# Patient Record
Sex: Female | Born: 1935 | Race: White | Hispanic: No | Smoking: Never smoker
Health system: Southern US, Community
[De-identification: ages and names within clinical notes are randomized; demographics above are authoritative.]

## PROBLEM LIST (undated history)

## (undated) DIAGNOSIS — M1990 Unspecified osteoarthritis, unspecified site: Secondary | ICD-10-CM

## (undated) DIAGNOSIS — R05 Cough: Secondary | ICD-10-CM

## (undated) DIAGNOSIS — I5020 Unspecified systolic (congestive) heart failure: Secondary | ICD-10-CM

## (undated) DIAGNOSIS — R058 Other specified cough: Secondary | ICD-10-CM

## (undated) DIAGNOSIS — I255 Ischemic cardiomyopathy: Secondary | ICD-10-CM

## (undated) DIAGNOSIS — R112 Nausea with vomiting, unspecified: Secondary | ICD-10-CM

## (undated) DIAGNOSIS — Z9889 Other specified postprocedural states: Secondary | ICD-10-CM

## (undated) DIAGNOSIS — J45909 Unspecified asthma, uncomplicated: Secondary | ICD-10-CM

## (undated) HISTORY — DX: Unspecified systolic (congestive) heart failure: I50.20

## (undated) HISTORY — DX: Other specified cough: R05.8

## (undated) HISTORY — DX: Cough: R05

## (undated) HISTORY — PX: JOINT REPLACEMENT: SHX530

## (undated) HISTORY — DX: Unspecified asthma, uncomplicated: J45.909

---

## 1958-09-30 HISTORY — PX: APPENDECTOMY: SHX54

## 1978-09-30 HISTORY — PX: JOINT REPLACEMENT: SHX530

## 2000-02-10 ENCOUNTER — Encounter: Payer: Self-pay | Admitting: Orthopaedic Surgery

## 2000-02-10 ENCOUNTER — Ambulatory Visit (HOSPITAL_COMMUNITY): Admission: RE | Admit: 2000-02-10 | Discharge: 2000-02-10 | Payer: Self-pay | Admitting: Orthopaedic Surgery

## 2000-02-22 ENCOUNTER — Encounter: Payer: Self-pay | Admitting: Orthopaedic Surgery

## 2000-02-22 ENCOUNTER — Ambulatory Visit (HOSPITAL_COMMUNITY): Admission: RE | Admit: 2000-02-22 | Discharge: 2000-02-22 | Payer: Self-pay | Admitting: Orthopaedic Surgery

## 2000-03-08 ENCOUNTER — Ambulatory Visit (HOSPITAL_COMMUNITY): Admission: RE | Admit: 2000-03-08 | Discharge: 2000-03-08 | Payer: Self-pay | Admitting: Orthopaedic Surgery

## 2000-03-08 ENCOUNTER — Encounter: Payer: Self-pay | Admitting: Orthopaedic Surgery

## 2000-03-22 ENCOUNTER — Ambulatory Visit (HOSPITAL_COMMUNITY): Admission: RE | Admit: 2000-03-22 | Discharge: 2000-03-22 | Payer: Self-pay | Admitting: Orthopaedic Surgery

## 2000-03-22 ENCOUNTER — Encounter: Payer: Self-pay | Admitting: Orthopaedic Surgery

## 2002-03-03 ENCOUNTER — Ambulatory Visit (HOSPITAL_COMMUNITY): Admission: RE | Admit: 2002-03-03 | Discharge: 2002-03-03 | Payer: Self-pay | Admitting: Orthopaedic Surgery

## 2002-03-03 ENCOUNTER — Encounter: Payer: Self-pay | Admitting: Orthopaedic Surgery

## 2002-03-13 ENCOUNTER — Encounter: Admission: RE | Admit: 2002-03-13 | Discharge: 2002-03-13 | Payer: Self-pay | Admitting: Orthopaedic Surgery

## 2002-03-13 ENCOUNTER — Encounter: Payer: Self-pay | Admitting: Orthopaedic Surgery

## 2002-03-26 ENCOUNTER — Encounter: Admission: RE | Admit: 2002-03-26 | Discharge: 2002-03-26 | Payer: Self-pay | Admitting: Orthopaedic Surgery

## 2002-03-26 ENCOUNTER — Encounter: Payer: Self-pay | Admitting: Orthopaedic Surgery

## 2002-04-10 ENCOUNTER — Encounter: Admission: RE | Admit: 2002-04-10 | Discharge: 2002-04-10 | Payer: Self-pay | Admitting: Orthopaedic Surgery

## 2002-04-10 ENCOUNTER — Encounter: Payer: Self-pay | Admitting: Orthopaedic Surgery

## 2003-10-25 ENCOUNTER — Encounter: Admission: RE | Admit: 2003-10-25 | Discharge: 2003-10-25 | Payer: Self-pay | Admitting: Orthopaedic Surgery

## 2003-11-08 ENCOUNTER — Encounter: Admission: RE | Admit: 2003-11-08 | Discharge: 2003-11-08 | Payer: Self-pay | Admitting: Orthopaedic Surgery

## 2003-12-10 ENCOUNTER — Encounter: Admission: RE | Admit: 2003-12-10 | Discharge: 2003-12-10 | Payer: Self-pay | Admitting: Orthopaedic Surgery

## 2004-12-22 ENCOUNTER — Encounter: Admission: RE | Admit: 2004-12-22 | Discharge: 2004-12-22 | Payer: Self-pay | Admitting: Orthopaedic Surgery

## 2005-01-05 ENCOUNTER — Encounter: Admission: RE | Admit: 2005-01-05 | Discharge: 2005-01-05 | Payer: Self-pay | Admitting: Orthopaedic Surgery

## 2006-10-14 ENCOUNTER — Ambulatory Visit: Payer: Self-pay | Admitting: Internal Medicine

## 2006-11-28 ENCOUNTER — Ambulatory Visit: Payer: Self-pay | Admitting: Internal Medicine

## 2007-01-30 HISTORY — PX: REPLACEMENT TOTAL KNEE BILATERAL: SUR1225

## 2008-02-02 ENCOUNTER — Encounter: Admission: RE | Admit: 2008-02-02 | Discharge: 2008-02-02 | Payer: Self-pay | Admitting: Orthopaedic Surgery

## 2008-02-09 ENCOUNTER — Encounter: Admission: RE | Admit: 2008-02-09 | Discharge: 2008-02-09 | Payer: Self-pay | Admitting: Orthopaedic Surgery

## 2008-02-23 ENCOUNTER — Encounter: Admission: RE | Admit: 2008-02-23 | Discharge: 2008-02-23 | Payer: Self-pay | Admitting: Orthopaedic Surgery

## 2008-03-15 ENCOUNTER — Encounter: Admission: RE | Admit: 2008-03-15 | Discharge: 2008-03-15 | Payer: Self-pay | Admitting: Orthopaedic Surgery

## 2010-02-18 ENCOUNTER — Encounter: Payer: Self-pay | Admitting: Orthopaedic Surgery

## 2010-06-13 NOTE — Assessment & Plan Note (Signed)
[facility]                             PULMONARY OFFICE NOTE   ELDA, DUNKERSON                      MRN:          [id number]  DATE:10/14/2006                            DOB:          [birth year]    PROBLEM:  A 77 year old woman self referred for evaluation of  bronchitis.   HISTORY:  This is the widow of a former patient of mine and the mother  of another.  She has been in good health most of her life, never smoked  and without significant respiratory history in the past.  She expected  an episode of bronchitis about once a year.  This year in March and  April she had a sustained episode of bronchitis which was treated with a  Z-Pak but never completely cleared.  She is left with occasional dry  cough triggered usually by exertion.  She has not been significantly  dyspneic and has not had cough or discomfort with sleep.  There has been  little sputum, no chest pain.   MEDICATIONS:  1. Fish oil.  2. Flax seed oil.  3. Occasional ibuprofen.   NO MEDICATION ALLERGY.   REVIEW OF SYSTEMS:  Nonproductive cough, stable weight, no fever,  adenopathy, rash, blood or purulent discharge, ankle edema or change in  bowel or bladder.  Has some chronic arthritis.   PAST HISTORY:  Appendectomy.  She denies any other significant medical  history.   SOCIAL HISTORY:  She had been a second hand smoker while married, now  widowed.  No alcohol.  She lives alone.  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   FAMILY HISTORY:  Daughter with asthmatic bronchitis, nobody else with  lung disease.  Note that at age 76 she went parasailing recently.   OBJECTIVE:  Weight 170 pounds, blood pressure 162/90, pulse 85, room air  saturation 98%.  Well-developed, well-nourished, very pleasant, relaxed,  comfortable appearing woman.  Skin without rash, adenopathy not found.  HEENT:  Conjunctivae, nasal mucosa and pharynx clear.  No neck vein  distention.  CHEST:  Trace crackle, lateral mid zones bilaterally.  No cough with a  deep breath, no increased worker breathing, no dullness or rub.  HEART:  Sounds regular without murmur or gallop.  EXTREMITIES:  Without cyanosis, clubbing or edema.   IMPRESSION:  Mild residual bronchitis after an acute episode this  Spring.   PLAN:  Chest x-ray, sample Qvar 80 one puff b.i.d. with instruction.  She will use up the sample.  If chest x-ray is negative and the Qvar  clear the last of this minor residual bronchitis then she will return  p.r.n.     Clinton D. Maple Hudson, MD, Tonny Bollman, FACP  Electronically Signed    CDY/MedQ  DD: 10/14/2006  DT: 10/15/2006  Job #: [id number]

## 2010-06-13 NOTE — Assessment & Plan Note (Signed)
 [facility]                             PULMONARY OFFICE NOTE   Beth, Johnston                      MRN:          [id number]  DATE:11/28/2006                            DOB:          1925/07/10    PROBLEM LIST:  Bronchitis.   HISTORY:  Mucinex was some help.  Going to beach next week and wants to  be sure she is cleared up.  Scant phlegm.  Qvar 80 sample did help but  is gone.  Never wheezes.  Unaware of any postnasal drip or reflux.   MEDICATIONS:  1. Fish oil.  2. Flax seed oil.  3. Mucinex p.r.n.   ALLERGIES:  No known drug allergies.   OBJECTIVE:  VITAL SIGNS:  Weight 172 pounds, blood pressure 158/84,  pulse 86, room saturation 98%.  CHEST:  Sounds clear.  Pharynx is clear.  Nasal airway unobstructed.  There is a grade 1-2/6 murmur of aortic stenosis in the right upper  anterior chest.  No edema.   Chest x-ray from September 15, showed mild cardiomegaly, mild bronchitis  change.   IMPRESSION:  1. Bronchitis with cough.  2. Aortic stenosis.   PLAN:  1. She declines flu vaccine.  2. We refilled Qvar 80 to continue one puff b.i.d.  3. Samples of Singulair for 7 days.  4. She will pay attention on how she does on her trip to the beach      emphasizing rest and fluids and for now will return p.r.n.     Clinton D. Maple Hudson, MD, Tonny Bollman, FACP  Electronically Signed    CDY/MedQ  DD: 11/30/2006  DT: 12/02/2006  Job #: [id number]   cc:   Sharlot Gowda, M.D.

## 2011-03-23 DIAGNOSIS — H2510 Age-related nuclear cataract, unspecified eye: Secondary | ICD-10-CM | POA: Diagnosis not present

## 2011-03-23 DIAGNOSIS — H5200 Hypermetropia, unspecified eye: Secondary | ICD-10-CM | POA: Diagnosis not present

## 2011-04-16 DIAGNOSIS — L570 Actinic keratosis: Secondary | ICD-10-CM | POA: Diagnosis not present

## 2011-04-16 DIAGNOSIS — D485 Neoplasm of uncertain behavior of skin: Secondary | ICD-10-CM | POA: Diagnosis not present

## 2011-08-13 DIAGNOSIS — L570 Actinic keratosis: Secondary | ICD-10-CM | POA: Diagnosis not present

## 2011-08-13 DIAGNOSIS — D485 Neoplasm of uncertain behavior of skin: Secondary | ICD-10-CM | POA: Diagnosis not present

## 2011-09-04 DIAGNOSIS — Z96649 Presence of unspecified artificial hip joint: Secondary | ICD-10-CM | POA: Diagnosis not present

## 2011-09-04 DIAGNOSIS — Z471 Aftercare following joint replacement surgery: Secondary | ICD-10-CM | POA: Diagnosis not present

## 2011-09-04 DIAGNOSIS — Z96659 Presence of unspecified artificial knee joint: Secondary | ICD-10-CM | POA: Diagnosis not present

## 2011-11-16 DIAGNOSIS — N8111 Cystocele, midline: Secondary | ICD-10-CM | POA: Diagnosis not present

## 2011-11-16 DIAGNOSIS — N816 Rectocele: Secondary | ICD-10-CM | POA: Diagnosis not present

## 2011-11-20 ENCOUNTER — Other Ambulatory Visit: Payer: Self-pay | Admitting: Obstetrics and Gynecology

## 2011-11-24 DIAGNOSIS — R05 Cough: Secondary | ICD-10-CM | POA: Diagnosis not present

## 2011-11-24 DIAGNOSIS — J069 Acute upper respiratory infection, unspecified: Secondary | ICD-10-CM | POA: Diagnosis not present

## 2011-12-07 ENCOUNTER — Encounter (HOSPITAL_COMMUNITY): Payer: Self-pay | Admitting: Pharmacist

## 2011-12-11 ENCOUNTER — Encounter (HOSPITAL_COMMUNITY): Payer: Self-pay

## 2011-12-11 ENCOUNTER — Encounter (HOSPITAL_COMMUNITY)
Admission: RE | Admit: 2011-12-11 | Discharge: 2011-12-11 | Disposition: A | Discharge status: Home / Self Care | Payer: Medicare Other | Source: Ambulatory Visit | Attending: Obstetrics and Gynecology | Admitting: Obstetrics and Gynecology

## 2011-12-11 DIAGNOSIS — N8111 Cystocele, midline: Secondary | ICD-10-CM | POA: Diagnosis not present

## 2011-12-11 HISTORY — DX: Unspecified osteoarthritis, unspecified site: M19.90

## 2011-12-11 HISTORY — DX: Other specified postprocedural states: Z98.890

## 2011-12-11 HISTORY — DX: Nausea with vomiting, unspecified: R11.2

## 2011-12-11 LAB — COMPREHENSIVE METABOLIC PANEL
ALT: 15 U/L (ref 0–35)
Albumin: 3.8 g/dL (ref 3.5–5.2)
Alkaline Phosphatase: 86 U/L (ref 39–117)
BUN: 17 mg/dL (ref 6–23)
CO2: 26 mEq/L (ref 19–32)
Calcium: 9.3 mg/dL (ref 8.4–10.5)
Chloride: 103 mEq/L (ref 96–112)
GFR calc Af Amer: >90 mL/min (ref >90)
GFR calc non Af Amer: 80 mL/min — ABNORMAL LOW (ref >90)
Glucose, Bld: 99 mg/dL (ref 70–99)
Total Bilirubin: 0.7 mg/dL (ref 0.3–1.2)
Total Protein: 6.8 g/dL (ref 6.0–8.3)

## 2011-12-11 LAB — CBC
Hemoglobin: 12.9 g/dL (ref 12.0–15.0)
MCHC: 32.4 g/dL (ref 30.0–36.0)
MCV: 82.7 fL (ref 78.0–100.0)
WBC: 5.9 10*3/uL (ref 4.0–10.5)

## 2011-12-11 LAB — PROTIME-INR: Prothrombin Time: 12.4 seconds (ref 11.6–15.2)

## 2011-12-11 NOTE — Pre-Procedure Instructions (Signed)
Per Dr Arby Barrette, patient to be referred to cardiologist for surgical clearance.  Patient to be referred to cardiologist  by Dr Miguel Aschoff, her Ob/Gyn.  Patient does not have MD.  Sherron Monday with Kennyth Arnold at Dr Charlott Rakes to do referral and call patient with appt information.  Patient informed of plan.

## 2011-12-11 NOTE — Patient Instructions (Addendum)
   Your procedure is scheduled on: Tuesday, Nov 19 at 730 am  Enter through the [employer] of [facility] at: 6 am Pick up the phone at the desk and dial [phone] and inform us of your arrival.  Please call this number if you have any problems the morning of surgery: [phone]  Remember: Do not eat food after midnight: Monday Do not drink clear liquids after: midnight Monday Take these medicines the morning of surgery with a SIP OF WATER:  None  Do not wear jewelry, make-up, or FINGER nail polish No metal in your hair or on your body. Do not wear lotions, powders, perfumes. You may wear deodorant.  Please use your CHG wash as directed prior to surgery.  Do not shave anywhere for at least 12 hours prior to first CHG shower.  Do not bring valuables to the hospital. Contacts, dentures or bridgework may not be worn into surgery.  Leave suitcase in the car. After Surgery it may be brought to your room. For patients being admitted to the hospital, checkout time is 11:00am the day of discharge.  Patients discharged on the day of surgery will not be allowed to drive home.  Home with daughter Beth Johnston.

## 2011-12-14 DIAGNOSIS — N8111 Cystocele, midline: Secondary | ICD-10-CM | POA: Diagnosis not present

## 2011-12-14 DIAGNOSIS — Z8249 Family history of ischemic heart disease and other diseases of the circulatory system: Secondary | ICD-10-CM | POA: Diagnosis not present

## 2011-12-14 DIAGNOSIS — Z01810 Encounter for preprocedural cardiovascular examination: Secondary | ICD-10-CM | POA: Diagnosis not present

## 2011-12-14 DIAGNOSIS — Z823 Family history of stroke: Secondary | ICD-10-CM | POA: Diagnosis not present

## 2011-12-14 DIAGNOSIS — R9431 Abnormal electrocardiogram [ECG] [EKG]: Secondary | ICD-10-CM | POA: Diagnosis not present

## 2011-12-14 DIAGNOSIS — N816 Rectocele: Secondary | ICD-10-CM | POA: Diagnosis not present

## 2011-12-14 DIAGNOSIS — I447 Left bundle-branch block, unspecified: Secondary | ICD-10-CM | POA: Diagnosis not present

## 2011-12-17 DIAGNOSIS — I2510 Atherosclerotic heart disease of native coronary artery without angina pectoris: Secondary | ICD-10-CM | POA: Diagnosis not present

## 2011-12-17 DIAGNOSIS — I7090 Unspecified atherosclerosis: Secondary | ICD-10-CM | POA: Diagnosis not present

## 2011-12-17 NOTE — H&P (Signed)
Beth Johnston is an 78 y.o. female who presented to my office on November 16, 2011 complaining of something protruding out of her vagina. This was causing her pressure and pain and she wanted to understand what this was and if it could be repaired. On examination she was found to have a large 3rd degree cystocele and and a 2nd degree rectocele. There was not significant descensus of the uterus noted. She was informed of the findings and options of treatment including surgery or the use of a pessary but opts now for surgical repair.   Pertinent Gynecological History: Menses: Post menopausal Bleeding: none Blood transfusions: none Sexually transmitted diseases: no past history OB History: G2 P2002     Past Medical History  Diagnosis Date  . PONV (postoperative nausea and vomiting)   . Arthritis     hands - no meds    Past Surgical History  Procedure Date  . Replacement total knee bilateral 2009  . Appendectomy     No family history on file.  Social History:  reports that she has never smoked. She has never used smokeless tobacco. She reports that she does not drink alcohol or use illicit drugs.  Allergies: No Known Allergies  No prescriptions prior to admission    ROS  Respiratory: no shortness of breath, no cough Cardiac: no chest pain, denies palpitations no DOE no orthopnea GI: no nausea, vomiting, constipation or diarrhea GU: no dysuria, frequency, urgency. No USI symptoms Gyn: no bleeding, no discharge, no itch   There were no vitals taken for this visit. Physical Exam  Afebrile  BP 128/68  Pulse 80  Respirations 14  Head: normocephalic and atraumatic Eyes: PERRLA Neck supple, no JVD no adenopathy, no bruits Chest: clear to P&A Heart regular rhythm no murmur or gallop. Widely split S2 Abdomen is soft with no enlargement of liver kidneys of spleen Pelvic exam:     External genitalia: within normal limits   BUS: within normal limits   Vagina: 3rd degree  cystocele is present, 2nd degree rectocele no enterocele no lesions otherwise noted   Cervix; without gross lesion no tender   Uterus; anterior, small non tender without significant descent   Adnexa: without mass and non tender    No results found for this or any previous visit (from the past 24 hour(s)).  No results found.  Impression: symptomatic cystocele and rectocele  Plan: anterior and posterior colporrhaphy. Do not think hysterectomy will be needed but will reassess under anesthesia. Will await cardiac clearance   Risks and benefits of the procedure have been discussed and informed consent has been obtained   [facility] 12/17/2011, 5:49 PM

## 2011-12-18 ENCOUNTER — Encounter (HOSPITAL_COMMUNITY): Payer: Self-pay | Admitting: Anesthesiology

## 2011-12-18 ENCOUNTER — Encounter (HOSPITAL_COMMUNITY): Payer: Self-pay | Admitting: *Deleted

## 2011-12-18 ENCOUNTER — Observation Stay (HOSPITAL_COMMUNITY)
Admission: RE | Admit: 2011-12-18 | Discharge: 2011-12-19 | Disposition: A | Discharge status: Home / Self Care | Payer: Medicare Other | Source: Ambulatory Visit | Attending: Obstetrics and Gynecology | Admitting: Obstetrics and Gynecology

## 2011-12-18 ENCOUNTER — Inpatient Hospital Stay (HOSPITAL_COMMUNITY): Payer: Medicare Other | Admitting: Anesthesiology

## 2011-12-18 ENCOUNTER — Encounter (HOSPITAL_COMMUNITY)
Admission: RE | Disposition: A | Discharge status: Home / Self Care | Payer: Self-pay | Source: Ambulatory Visit | Attending: Obstetrics and Gynecology

## 2011-12-18 DIAGNOSIS — N8111 Cystocele, midline: Principal | ICD-10-CM | POA: Insufficient documentation

## 2011-12-18 DIAGNOSIS — IMO0002 Reserved for concepts with insufficient information to code with codable children: Secondary | ICD-10-CM

## 2011-12-18 DIAGNOSIS — N816 Rectocele: Secondary | ICD-10-CM | POA: Diagnosis not present

## 2011-12-18 HISTORY — PX: CYSTOCELE REPAIR: SHX163

## 2011-12-18 SURGERY — COLPORRHAPHY, ANTERIOR, FOR CYSTOCELE REPAIR
Anesthesia: General | Site: Vagina | Wound class: Clean Contaminated

## 2011-12-18 MED ORDER — FENTANYL CITRATE 0.05 MG/ML IJ SOLN
INTRAMUSCULAR | Status: AC
  Filled 2011-12-18: qty 10

## 2011-12-18 MED ORDER — LACTATED RINGERS IV SOLN
INTRAVENOUS | Status: DC
  Administered 2011-12-18 (×2): via INTRAVENOUS

## 2011-12-18 MED ORDER — FENTANYL CITRATE 0.05 MG/ML IJ SOLN
INTRAMUSCULAR | Status: AC
  Filled 2011-12-18: qty 5

## 2011-12-18 MED ORDER — PROPOFOL 10 MG/ML IV EMUL
INTRAVENOUS | Status: AC
  Filled 2011-12-18: qty 20

## 2011-12-18 MED ORDER — CEFAZOLIN SODIUM-DEXTROSE 2-3 GM-% IV SOLR
2.0000 g | INTRAVENOUS | Status: AC
  Administered 2011-12-18: 2 g via INTRAVENOUS

## 2011-12-18 MED ORDER — ONDANSETRON HCL 4 MG/2ML IJ SOLN: 4.0000 mg | Freq: Once | INTRAMUSCULAR | Status: DC | PRN

## 2011-12-18 MED ORDER — DIPHENHYDRAMINE HCL 50 MG/ML IJ SOLN: 12.5000 mg | Freq: Four times a day (QID) | INTRAMUSCULAR | Status: DC | PRN

## 2011-12-18 MED ORDER — ONDANSETRON HCL 4 MG/2ML IJ SOLN
INTRAMUSCULAR | Status: AC
  Filled 2011-12-18: qty 2

## 2011-12-18 MED ORDER — NALOXONE HCL 0.4 MG/ML IJ SOLN: 0.4000 mg | INTRAMUSCULAR | Status: DC | PRN

## 2011-12-18 MED ORDER — LIDOCAINE-EPINEPHRINE 1 %-1:100000 IJ SOLN
INTRAMUSCULAR | Status: DC | PRN
  Administered 2011-12-18: 6 mL

## 2011-12-18 MED ORDER — SODIUM CHLORIDE 0.9 % IJ SOLN: 9.0000 mL | INTRAMUSCULAR | Status: DC | PRN

## 2011-12-18 MED ORDER — MORPHINE SULFATE (PF) 1 MG/ML IV SOLN
INTRAVENOUS | Status: DC
  Administered 2011-12-18: 10:00:00 via INTRAVENOUS
  Filled 2011-12-18: qty 25

## 2011-12-18 MED ORDER — ACETAMINOPHEN 10 MG/ML IV SOLN
1000.0000 mg | Freq: Once | INTRAVENOUS | Status: AC
  Administered 2011-12-18: 1000 mg via INTRAVENOUS
  Filled 2011-12-18: qty 100

## 2011-12-18 MED ORDER — LIDOCAINE HCL (CARDIAC) 20 MG/ML IV SOLN
INTRAVENOUS | Status: AC
  Filled 2011-12-18: qty 5

## 2011-12-18 MED ORDER — MEPERIDINE HCL 25 MG/ML IJ SOLN: 6.2500 mg | INTRAMUSCULAR | Status: DC | PRN

## 2011-12-18 MED ORDER — KETOROLAC TROMETHAMINE 30 MG/ML IJ SOLN: 15.0000 mg | Freq: Once | INTRAMUSCULAR | Status: DC | PRN

## 2011-12-18 MED ORDER — ROCURONIUM BROMIDE 50 MG/5ML IV SOLN
INTRAVENOUS | Status: AC
  Filled 2011-12-18: qty 1

## 2011-12-18 MED ORDER — LACTATED RINGERS IV SOLN: INTRAVENOUS | Status: DC

## 2011-12-18 MED ORDER — DIPHENHYDRAMINE HCL 12.5 MG/5ML PO ELIX: 12.5000 mg | ORAL_SOLUTION | Freq: Four times a day (QID) | ORAL | Status: DC | PRN

## 2011-12-18 MED ORDER — OXYCODONE-ACETAMINOPHEN 5-325 MG PO TABS: 1.0000 | ORAL_TABLET | ORAL | Status: DC | PRN

## 2011-12-18 MED ORDER — FENTANYL CITRATE 0.05 MG/ML IJ SOLN
INTRAMUSCULAR | Status: DC | PRN
  Administered 2011-12-18: 25 ug via INTRAVENOUS
  Administered 2011-12-18: 50 ug via INTRAVENOUS
  Administered 2011-12-18: 25 ug via INTRAVENOUS

## 2011-12-18 MED ORDER — DEXAMETHASONE SODIUM PHOSPHATE 10 MG/ML IJ SOLN
INTRAMUSCULAR | Status: AC
  Filled 2011-12-18: qty 1

## 2011-12-18 MED ORDER — DEXAMETHASONE SODIUM PHOSPHATE 4 MG/ML IJ SOLN
INTRAMUSCULAR | Status: DC | PRN
  Administered 2011-12-18: 8 mg via INTRAVENOUS

## 2011-12-18 MED ORDER — MIDAZOLAM HCL 2 MG/2ML IJ SOLN
INTRAMUSCULAR | Status: AC
  Filled 2011-12-18: qty 2

## 2011-12-18 MED ORDER — LIDOCAINE HCL (CARDIAC) 20 MG/ML IV SOLN
INTRAVENOUS | Status: DC | PRN
  Administered 2011-12-18: 30 mg via INTRAVENOUS

## 2011-12-18 MED ORDER — MENTHOL 3 MG MT LOZG: 1.0000 | LOZENGE | OROMUCOSAL | Status: DC | PRN

## 2011-12-18 MED ORDER — IBUPROFEN 600 MG PO TABS: 600.0000 mg | ORAL_TABLET | Freq: Four times a day (QID) | ORAL | Status: DC | PRN

## 2011-12-18 MED ORDER — PROPOFOL 10 MG/ML IV EMUL
INTRAVENOUS | Status: DC | PRN
  Administered 2011-12-18: 100 mg via INTRAVENOUS

## 2011-12-18 MED ORDER — CEFAZOLIN SODIUM-DEXTROSE 2-3 GM-% IV SOLR
INTRAVENOUS | Status: AC
  Filled 2011-12-18: qty 50

## 2011-12-18 MED ORDER — FENTANYL CITRATE 0.05 MG/ML IJ SOLN: 25.0000 ug | INTRAMUSCULAR | Status: DC | PRN

## 2011-12-18 MED ORDER — ONDANSETRON HCL 4 MG/2ML IJ SOLN
INTRAMUSCULAR | Status: DC | PRN
  Administered 2011-12-18: 4 mg via INTRAVENOUS

## 2011-12-18 SURGICAL SUPPLY — 45 items
BLADE SURG 10 STRL SS (BLADE) ×3 IMPLANT
BLADE SURG 15 STRL LF C SS BP (BLADE) ×2 IMPLANT
BLADE SURG 15 STRL SS (BLADE) ×3
CANISTER SUCTION 2500CC (MISCELLANEOUS) ×3 IMPLANT
CATH BONANNO SUPRAPUBIC 14G (CATHETERS) IMPLANT
CLOTH BEACON ORANGE TIMEOUT ST (SAFETY) ×3 IMPLANT
CONT PATH 16OZ SNAP LID 3702 (MISCELLANEOUS) IMPLANT
DECANTER SPIKE VIAL GLASS SM (MISCELLANEOUS) ×6 IMPLANT
DRESSING TELFA 8X3 (GAUZE/BANDAGES/DRESSINGS) ×3 IMPLANT
ELECT REM PT RETURN 9FT ADLT (ELECTROSURGICAL) ×3
ELECTRODE REM PT RTRN 9FT ADLT (ELECTROSURGICAL) ×2 IMPLANT
GAUZE PACKING IODOFORM 1 (PACKING) ×2 IMPLANT
GLOVE BIO SURGEON STRL SZ7.5 (GLOVE) ×6 IMPLANT
GLOVE BIOGEL PI IND STRL 6 (GLOVE) ×4 IMPLANT
GLOVE BIOGEL PI IND STRL 6.5 (GLOVE) ×4 IMPLANT
GLOVE BIOGEL PI IND STRL 7.0 (GLOVE) ×8 IMPLANT
GLOVE BIOGEL PI INDICATOR 6 (GLOVE) ×2
GLOVE BIOGEL PI INDICATOR 6.5 (GLOVE) ×2
GLOVE BIOGEL PI INDICATOR 7.0 (GLOVE) ×4
GLOVE ECLIPSE 6.5 STRL STRAW (GLOVE) ×6 IMPLANT
GLOVE ECLIPSE 7.5 STRL STRAW (GLOVE) ×3 IMPLANT
GLOVE SURG SS PI 7.0 STRL IVOR (GLOVE) ×6 IMPLANT
GOWN PREVENTION PLUS LG XLONG (DISPOSABLE) ×9 IMPLANT
GOWN PREVENTION PLUS XXLARGE (GOWN DISPOSABLE) ×3 IMPLANT
GOWN STRL REIN XL XLG (GOWN DISPOSABLE) ×12 IMPLANT
NDL SPNL 22GX3.5 QUINCKE BK (NEEDLE) IMPLANT
NEEDLE HYPO 22GX1.5 SAFETY (NEEDLE) ×3 IMPLANT
NEEDLE MAYO .5 CIRCLE (NEEDLE) IMPLANT
NEEDLE SPNL 22GX3.5 QUINCKE BK (NEEDLE) ×3 IMPLANT
NS IRRIG 1000ML POUR BTL (IV SOLUTION) ×3 IMPLANT
PACK VAGINAL WOMENS (CUSTOM PROCEDURE TRAY) ×3 IMPLANT
PAD OB MATERNITY 4.3X12.25 (PERSONAL CARE ITEMS) ×3 IMPLANT
SPONGE LAP 4X18 X RAY DECT (DISPOSABLE) ×3 IMPLANT
SUT VIC AB 0 CT1 18XCR BRD8 (SUTURE) ×4 IMPLANT
SUT VIC AB 0 CT1 27 (SUTURE) ×9
SUT VIC AB 0 CT1 27XBRD ANBCTR (SUTURE) ×6 IMPLANT
SUT VIC AB 0 CT1 8-18 (SUTURE) ×6
SUT VIC AB 2-0 CT1 27 (SUTURE) ×9
SUT VIC AB 2-0 CT1 TAPERPNT 27 (SUTURE) ×3 IMPLANT
SUT VIC AB 2-0 SH 27 (SUTURE) ×9
SUT VIC AB 2-0 SH 27XBRD (SUTURE) ×6 IMPLANT
SUT VICRYL 1 TIES 12X18 (SUTURE) ×3 IMPLANT
TOWEL OR 17X24 6PK STRL BLUE (TOWEL DISPOSABLE) ×6 IMPLANT
TRAY FOLEY CATH 14FR (SET/KITS/TRAYS/PACK) ×3 IMPLANT
WATER STERILE IRR 1000ML POUR (IV SOLUTION) ×3 IMPLANT

## 2011-12-18 NOTE — Brief Op Note (Signed)
12/18/2011  8:10 AM  PATIENT:  Beth Johnston  78 y.o. female  PRE-OPERATIVE DIAGNOSIS:  CYSTOCELE RECTOCELE  POST-OPERATIVE DIAGNOSIS:  CYSTOCELE RECTOCELE  PROCEDURE:  Procedure(s) (LRB) with comments: ANTERIOR REPAIR (CYSTOCELE) (N/A)  SURGEON:  Surgeon(s) and Role:    * Miguel Aschoff, MD - Primary    * W Lodema Hong, MD - Assisting  ANESTHESIA:   general  EBL:  Total I/O In: 1000 [I.V.:1000] Out: 120 [Urine:100; Blood:20]  BLOOD ADMINISTERED:none  DRAINS: Urinary Catheter (Foley)   LOCAL MEDICATIONS USED:  LIDOCAINE   SPECIMEN:  No Specimen  DISPOSITION OF SPECIMEN:  N/A  COUNTS:  YES  TOURNIQUET:  * No tourniquets in log *  DICTATION: .Other Dictation: Dictation Number [id number]  PLAN OF CARE: Admit for overnight observation  PATIENT DISPOSITION:  PACU - hemodynamically stable.   Delay start of Pharmacological VTE agent (>24hrs) due to surgical blood loss or risk of bleeding: PAS hose applied.

## 2011-12-18 NOTE — Anesthesia Postprocedure Evaluation (Signed)
  Anesthesia Post-op Note  Patient: Beth Johnston  Procedure(s) Performed: Procedure(s) (LRB) with comments: ANTERIOR REPAIR (CYSTOCELE) (N/A)  Patient Location: PACU and Women's Unit  Anesthesia Type:General  Level of Consciousness: awake, alert , oriented and patient cooperative  Airway and Oxygen Therapy: Patient Spontanous Breathing  Post-op Pain: none  Post-op Assessment: Post-op Vital signs reviewed and Patient's Cardiovascular Status Stable  Post-op Vital Signs: Reviewed and stable  Complications: No apparent anesthesia complications

## 2011-12-18 NOTE — Anesthesia Preprocedure Evaluation (Signed)
Anesthesia Evaluation  Patient identified by MRN, date of birth, ID band Patient awake    Reviewed: Allergy & Precautions, H&P , NPO status , Patient's Chart, lab work & pertinent test results, reviewed documented beta blocker date and time   Airway Mallampati: II TM Distance: >3 FB Neck ROM: full    Dental  (+) Teeth Intact   Pulmonary neg pulmonary ROS,    Pulmonary exam normal       Cardiovascular negative cardio ROS      Neuro/Psych negative neurological ROS  negative psych ROS   GI/Hepatic negative GI ROS, Neg liver ROS,   Endo/Other  negative endocrine ROS  Renal/GU negative Renal ROS  negative genitourinary   Musculoskeletal negative musculoskeletal ROS (+)   Abdominal Normal abdominal exam  (+)   Peds negative pediatric ROS (+)  Hematology negative hematology ROS (+)   Anesthesia Other Findings   Reproductive/Obstetrics negative OB ROS                           Anesthesia Physical Anesthesia Plan  ASA: II  Anesthesia Plan: General   Post-op Pain Management:    Induction: Intravenous  Airway Management Planned: LMA  Additional Equipment:   Intra-op Plan:   Post-operative Plan:   Informed Consent: I have reviewed the patients History and Physical, chart, labs and discussed the procedure including the risks, benefits and alternatives for the proposed anesthesia with the patient or authorized representative who has indicated his/her understanding and acceptance.     Plan Discussed with: CRNA and Surgeon  Anesthesia Plan Comments:         Anesthesia Quick Evaluation

## 2011-12-18 NOTE — Anesthesia Postprocedure Evaluation (Signed)
Anesthesia Post Note  Patient: Beth Johnston  Procedure(s) Performed: Procedure(s) (LRB): ANTERIOR REPAIR (CYSTOCELE) (N/A)  Anesthesia type: General  Patient location: PACU  Post pain: Pain level controlled  Post assessment: Post-op Vital signs reviewed  Last Vitals:  Filed Vitals:   12/18/11 0845  BP: 133/57  Pulse: 72  Temp:   Resp: 18    Post vital signs: Reviewed  Level of consciousness: sedated  Complications: No apparent anesthesia complicationsfj

## 2011-12-18 NOTE — Transfer of Care (Signed)
Immediate Anesthesia Transfer of Care Note  Patient: Beth Johnston  Procedure(s) Performed: Procedure(s) (LRB) with comments: ANTERIOR REPAIR (CYSTOCELE) (N/A)  Patient Location: PACU  Anesthesia Type:General  Level of Consciousness: awake, alert  and oriented  Airway & Oxygen Therapy: Patient Spontanous Breathing and Patient connected to nasal cannula oxygen  Post-op Assessment: Report given to PACU RN and Post -op Vital signs reviewed and stable  Post vital signs: Reviewed and stable  Complications: No apparent anesthesia complications

## 2011-12-18 NOTE — Addendum Note (Signed)
Addendum  created 12/18/11 1748 by Orlie Pollen, CRNA   Modules edited:Notes Section

## 2011-12-19 ENCOUNTER — Encounter (HOSPITAL_COMMUNITY): Payer: Self-pay | Admitting: Obstetrics and Gynecology

## 2011-12-19 NOTE — Op Note (Signed)
[name], [name]:  [id number]  MEDICAL RECORD NO.:  [id number]  LOCATION:  9320                          FACILITY:  [facility]  PHYSICIAN:  Miguel Aschoff, M.D.       DATE OF BIRTH:  [birth year]  DATE OF PROCEDURE:  12/18/2011 DATE OF DISCHARGE:                              OPERATIVE REPORT   PREOPERATIVE DIAGNOSIS:  Symptomatic cystocele.  POSTOPERATIVE DIAGNOSIS:  Symptomatic cystocele.  PROCEDURE:  Anterior colporrhaphy.  SURGEON:  Miguel Aschoff, M.D.  ANESTHESIA:  General.  COMPLICATIONS:  None.  JUSTIFICATION:  The patient is an 78 year old white female, complaining of pelvic pressure and pain and something bulging through the vagina. On examination, she was noted to have a large cystocele protruding through the introitus.  There was a small rectocele, no definite enterocele and minimal uterine descensus.  Because of this defect in the anterior vaginal wall, she is now being taken to the operating room to undergo anterior colporrhaphy, possible posterior colporrhaphy based on the intraoperative findings.  The risks and benefits of procedure were discussed with the patient, and informed consent has been obtained.  PROCEDURE:  The patient was taken to the operative room and placed in supine position.  General anesthesia was administered without difficulty.  She was then placed in the dorsal lithotomy position, prepped and draped in the usual sterile fashion.  Foley catheter was inserted.  At this point, a weighted speculum was placed in the vaginal vault.  Examination prior to placing the speculum revealed normal external genitalia, normal Bartholin's, Skene's glands, and normal urethra.  There was a third-degree cystocele present.  First-degree rectocele.  No definite enterocele noted and only +1 to +2 descensus of the uterus.  The uterus was otherwise normal size and shape.  No adnexal masses were noted.  Weighted speculum was then placed in the  vaginal vault.  Allis clamps were used to grasp the large cystocele in the midline.  The anterior vaginal wall was then injected with approximately 6 mL of 1% Xylocaine with epinephrine for hemostasis.  Then, the midline was incised with a scalpel.  Then using Allis clamps, the edges of the incision were grasped and it was possible to dissect the cystocele and the paravesical fascia off the vaginal mucosa.  This was continued until the cystocele was completely freed and outlined.  Then using serial pursestring sutures of 2-0 Vicryl, the large cystocele was reduced. Four such pursestring sutures were used.  After reducing the cystocele, the paravesical fascia was identified and reapproximated in the midline using interrupted 2-0 Vicryl sutures.  With now the interior wall well supported and the cystocele reduced, the excess vaginal mucosa was trimmed and then the vaginal mucosa was reapproximated using running interlocking 2-0 Vicryl suture picking up the dead space and closing it along the closure of the anterior vaginal wall.  On completion of the mucosal closure, final inspection was made.  There was excellent hemostasis.  An Iodoform pack was placed and the procedure was completed.  The estimated blood loss was minimal.  The patient tolerated the procedure well and went to the recovery room in satisfactory condition.  The plan is for the patient to  be observed overnight and discharged home on December 19, 2011, in satisfactory condition and stable.     Miguel Aschoff, M.D.     AR/MEDQ  D:  12/18/2011  T:  12/19/2011  Job:  [id number]

## 2011-12-19 NOTE — Progress Notes (Signed)
Stable throughout the night. No problems, minimal pain.  Afebrile  BP 148/60  Pulse 66  Abdomen is soft non tender  Perineum is dry  Impression: Stable S/P anterior repair  Plan:    Discharge home           Regular diet  Nothing per vagina  Follow up visit in four weeks  Condition improved

## 2011-12-19 NOTE — Progress Notes (Signed)
Patient discharged home.  Patient verbalized understanding of discharge instructions.  Patient ambulated to car 

## 2011-12-21 ENCOUNTER — Encounter (HOSPITAL_COMMUNITY): Payer: Self-pay

## 2011-12-21 ENCOUNTER — Ambulatory Visit: Payer: Managed Care, Other (non HMO) | Admitting: Cardiovascular Disease

## 2011-12-25 NOTE — Discharge Summary (Signed)
Beth Johnston, Beth [name]:  [id number]  MEDICAL RECORD NO.:  [id number]  LOCATION:  9320                          FACILITY:  [facility]  PHYSICIAN:  Miguel Aschoff, M.D.       DATE OF BIRTH:  [birth year]  DATE OF ADMISSION:  12/18/2011 DATE OF DISCHARGE:  12/19/2011                              DISCHARGE SUMMARY   ADMISSION DIAGNOSIS:  Symptomatic cystocele.  POSTOPERATIVE DIAGNOSIS:  Symptomatic cystocele.  OPERATIONS AND PROCEDURES:  Anterior colporrhaphy.  BRIEF HISTORY:  The patient is an 78 year old white female, who presents to the office with pelvic pressure and pain and reporting that something was protruding through the vagina.  On examination, the patient was noted to have a large cystocele, grade 3 and it was visible at the introitus__________ beyond the introitus.  Because of the symptoms associated with this and the patient's request for a repair, the options were discussed of treating this problem, which included the use of a pessary or undergoing a surgical procedure and the patient opted to undergo an anterior colporrhaphy and possible posterior colporrhaphy, and she was in relatively good health and wanted not to have to deal with the care of a pessary.  Because of this, she was admitted to the hospital to undergo an anterior colporrhaphy.  Preoperative studies were obtained and chemistry profile was essentially within normal limits with a slight decrease in the GFR to 80.  CBC revealed a hemoglobin of 12.9, white count 5900.  PT and PTT were within normal limits.  HOSPITAL COURSE:  Under general anesthesia, an anterior colporrhaphy was carried out on December 18, 2011, without difficulty.  The patient was observed overnight.  Vaginal __________ pack left in place and on the morning of December 19, 2011, she was in satisfactory condition, requiring no additional pain medications.  She was tolerating her diet, was able to void once the catheter and the  packs were removed.  At this point, it was felt that she was stable enough to be discharged home. She was told to resume any prior medications that she was taking prior to being admitted to the hospital.  She was instructed to place nothing in the vagina, to call and set up a followup appointment after 4 weeks and to call if there are any problems such as fever, pain, or heavy bleeding.  She was sent home in satisfactory condition.     Miguel Aschoff, M.D.     AR/MEDQ  D:  12/25/2011  T:  12/25/2011  Job:  [id number]

## 2012-01-01 DIAGNOSIS — J209 Acute bronchitis, unspecified: Secondary | ICD-10-CM | POA: Diagnosis not present

## 2012-02-23 DIAGNOSIS — J019 Acute sinusitis, unspecified: Secondary | ICD-10-CM | POA: Diagnosis not present

## 2012-02-23 DIAGNOSIS — J042 Acute laryngotracheitis: Secondary | ICD-10-CM | POA: Diagnosis not present

## 2012-04-02 DIAGNOSIS — M25559 Pain in unspecified hip: Secondary | ICD-10-CM | POA: Diagnosis not present

## 2012-04-07 DIAGNOSIS — M25559 Pain in unspecified hip: Secondary | ICD-10-CM | POA: Diagnosis not present

## 2012-04-18 DIAGNOSIS — J209 Acute bronchitis, unspecified: Secondary | ICD-10-CM | POA: Diagnosis not present

## 2012-04-23 DIAGNOSIS — M25559 Pain in unspecified hip: Secondary | ICD-10-CM | POA: Diagnosis not present

## 2012-05-13 DIAGNOSIS — L570 Actinic keratosis: Secondary | ICD-10-CM | POA: Diagnosis not present

## 2012-05-13 DIAGNOSIS — D485 Neoplasm of uncertain behavior of skin: Secondary | ICD-10-CM | POA: Diagnosis not present

## 2012-05-13 DIAGNOSIS — K130 Diseases of lips: Secondary | ICD-10-CM | POA: Diagnosis not present

## 2012-07-10 DIAGNOSIS — J019 Acute sinusitis, unspecified: Secondary | ICD-10-CM | POA: Diagnosis not present

## 2012-07-10 DIAGNOSIS — J309 Allergic rhinitis, unspecified: Secondary | ICD-10-CM | POA: Diagnosis not present

## 2012-08-27 DIAGNOSIS — L570 Actinic keratosis: Secondary | ICD-10-CM | POA: Diagnosis not present

## 2012-08-29 DIAGNOSIS — H2510 Age-related nuclear cataract, unspecified eye: Secondary | ICD-10-CM | POA: Diagnosis not present

## 2012-12-02 DIAGNOSIS — L821 Other seborrheic keratosis: Secondary | ICD-10-CM | POA: Diagnosis not present

## 2012-12-02 DIAGNOSIS — L570 Actinic keratosis: Secondary | ICD-10-CM | POA: Diagnosis not present

## 2012-12-11 DIAGNOSIS — R05 Cough: Secondary | ICD-10-CM | POA: Diagnosis not present

## 2012-12-11 DIAGNOSIS — J309 Allergic rhinitis, unspecified: Secondary | ICD-10-CM | POA: Diagnosis not present

## 2013-01-01 DIAGNOSIS — J209 Acute bronchitis, unspecified: Secondary | ICD-10-CM | POA: Diagnosis not present

## 2013-01-05 ENCOUNTER — Ambulatory Visit (INDEPENDENT_AMBULATORY_CARE_PROVIDER_SITE_OTHER): Payer: Medicare Other

## 2013-01-05 ENCOUNTER — Ambulatory Visit (INDEPENDENT_AMBULATORY_CARE_PROVIDER_SITE_OTHER): Payer: Medicare Other | Admitting: Podiatry

## 2013-01-05 ENCOUNTER — Encounter: Payer: Self-pay | Admitting: Podiatry

## 2013-01-05 VITALS — BP 144/76 | HR 80 | Resp 16 | Ht 65.0 in | Wt 160.0 lb

## 2013-01-05 DIAGNOSIS — M722 Plantar fascial fibromatosis: Secondary | ICD-10-CM | POA: Diagnosis not present

## 2013-01-05 MED ORDER — TRIAMCINOLONE ACETONIDE 10 MG/ML IJ SUSP
10.0000 mg | Freq: Once | INTRAMUSCULAR | Status: AC
  Administered 2013-01-05: 10 mg

## 2013-01-05 NOTE — Patient Instructions (Signed)
Plantar Fasciitis (Heel Spur Syndrome)  with Rehab  The plantar fascia is a fibrous, ligament-like, soft-tissue structure that spans the bottom of the foot. Plantar fasciitis is a condition that causes pain in the foot due to inflammation of the tissue.  SYMPTOMS   · Pain and tenderness on the underneath side of the foot.  · Pain that worsens with standing or walking.  CAUSES   Plantar fasciitis is caused by irritation and injury to the plantar fascia on the underneath side of the foot. Common mechanisms of injury include:  · Direct trauma to bottom of the foot.  · Damage to a small nerve that runs under the foot where the main fascia attaches to the heel bone.  · Stress placed on the plantar fascia due to bone spurs.  RISK INCREASES WITH:   · Activities that place stress on the plantar fascia (running, jumping, pivoting, or cutting).  · Poor strength and flexibility.  · Improperly fitted shoes.  · Tight calf muscles.  · Flat feet.  · Failure to warm-up properly before activity.  · Obesity.  PREVENTION  · Warm up and stretch properly before activity.  · Allow for adequate recovery between workouts.  · Maintain physical fitness:  · Strength, flexibility, and endurance.  · Cardiovascular fitness.  · Maintain a health body weight.  · Avoid stress on the plantar fascia.  · Wear properly fitted shoes, including arch supports for individuals who have flat feet.  PROGNOSIS   If treated properly, then the symptoms of plantar fasciitis usually resolve without surgery. However, occasionally surgery is necessary.  RELATED COMPLICATIONS   · Recurrent symptoms that may result in a chronic condition.  · Problems of the lower back that are caused by compensating for the injury, such as limping.  · Pain or weakness of the foot during push-off following surgery.  · Chronic inflammation, scarring, and partial or complete fascia tear, occurring more often from repeated injections.  TREATMENT   Treatment initially involves the use of  ice and medication to help reduce pain and inflammation. The use of strengthening and stretching exercises may help reduce pain with activity, especially stretches of the Achilles tendon. These exercises may be performed at home or with a therapist. Your caregiver may recommend that you use heel cups of arch supports to help reduce stress on the plantar fascia. Occasionally, corticosteroid injections are given to reduce inflammation. If symptoms persist for greater than 6 months despite non-surgical (conservative), then surgery may be recommended.   MEDICATION   · If pain medication is necessary, then nonsteroidal anti-inflammatory medications, such as aspirin and ibuprofen, or other minor pain relievers, such as acetaminophen, are often recommended.  · Do not take pain medication within 7 days before surgery.  · Prescription pain relievers may be given if deemed necessary by your caregiver. Use only as directed and only as much as you need.  · Corticosteroid injections may be given by your caregiver. These injections should be reserved for the most serious cases, because they may only be given a certain number of times.  HEAT AND COLD  · Cold treatment (icing) relieves pain and reduces inflammation. Cold treatment should be applied for 10 to 15 minutes every 2 to 3 hours for inflammation and pain and immediately after any activity that aggravates your symptoms. Use ice packs or massage the area with a piece of ice (ice massage).  · Heat treatment may be used prior to performing the stretching and strengthening activities prescribed   by your caregiver, physical therapist, or athletic trainer. Use a heat pack or soak the injury in warm water.  SEEK IMMEDIATE MEDICAL CARE IF:  · Treatment seems to offer no benefit, or the condition worsens.  · Any medications produce adverse side effects.  EXERCISES  RANGE OF MOTION (ROM) AND STRETCHING EXERCISES - Plantar Fasciitis (Heel Spur Syndrome)  These exercises may help you  when beginning to rehabilitate your injury. Your symptoms may resolve with or without further involvement from your physician, physical therapist or athletic trainer. While completing these exercises, remember:   · Restoring tissue flexibility helps normal motion to return to the joints. This allows healthier, less painful movement and activity.  · An effective stretch should be held for at least 30 seconds.  · A stretch should never be painful. You should only feel a gentle lengthening or release in the stretched tissue.  RANGE OF MOTION - Toe Extension, Flexion  · Sit with your right / left leg crossed over your opposite knee.  · Grasp your toes and gently pull them back toward the top of your foot. You should feel a stretch on the bottom of your toes and/or foot.  · Hold this stretch for __________ seconds.  · Now, gently pull your toes toward the bottom of your foot. You should feel a stretch on the top of your toes and or foot.  · Hold this stretch for __________ seconds.  Repeat __________ times. Complete this stretch __________ times per day.   RANGE OF MOTION - Ankle Dorsiflexion, Active Assisted  · Remove shoes and sit on a chair that is preferably not on a carpeted surface.  · Place right / left foot under knee. Extend your opposite leg for support.  · Keeping your heel down, slide your right / left foot back toward the chair until you feel a stretch at your ankle or calf. If you do not feel a stretch, slide your bottom forward to the edge of the chair, while still keeping your heel down.  · Hold this stretch for __________ seconds.  Repeat __________ times. Complete this stretch __________ times per day.   STRETCH  Gastroc, Standing  · Place hands on wall.  · Extend right / left leg, keeping the front knee somewhat bent.  · Slightly point your toes inward on your back foot.  · Keeping your right / left heel on the floor and your knee straight, shift your weight toward the wall, not allowing your back to  arch.  · You should feel a gentle stretch in the right / left calf. Hold this position for __________ seconds.  Repeat __________ times. Complete this stretch __________ times per day.  STRETCH  Soleus, Standing  · Place hands on wall.  · Extend right / left leg, keeping the other knee somewhat bent.  · Slightly point your toes inward on your back foot.  · Keep your right / left heel on the floor, bend your back knee, and slightly shift your weight over the back leg so that you feel a gentle stretch deep in your back calf.  · Hold this position for __________ seconds.  Repeat __________ times. Complete this stretch __________ times per day.  STRETCH  Gastrocsoleus, Standing   Note: This exercise can place a lot of stress on your foot and ankle. Please complete this exercise only if specifically instructed by your caregiver.   · Place the ball of your right / left foot on a step, keeping   your other foot firmly on the same step.  · Hold on to the wall or a rail for balance.  · Slowly lift your other foot, allowing your body weight to press your heel down over the edge of the step.  · You should feel a stretch in your right / left calf.  · Hold this position for __________ seconds.  · Repeat this exercise with a slight bend in your right / left knee.  Repeat __________ times. Complete this stretch __________ times per day.   STRENGTHENING EXERCISES - Plantar Fasciitis (Heel Spur Syndrome)   These exercises may help you when beginning to rehabilitate your injury. They may resolve your symptoms with or without further involvement from your physician, physical therapist or athletic trainer. While completing these exercises, remember:   · Muscles can gain both the endurance and the strength needed for everyday activities through controlled exercises.  · Complete these exercises as instructed by your physician, physical therapist or athletic trainer. Progress the resistance and repetitions only as guided.  STRENGTH - Towel  Curls  · Sit in a chair positioned on a non-carpeted surface.  · Place your foot on a towel, keeping your heel on the floor.  · Pull the towel toward your heel by only curling your toes. Keep your heel on the floor.  · If instructed by your physician, physical therapist or athletic trainer, add ____________________ at the end of the towel.  Repeat __________ times. Complete this exercise __________ times per day.  STRENGTH - Ankle Inversion  · Secure one end of a rubber exercise band/tubing to a fixed object (table, pole). Loop the other end around your foot just before your toes.  · Place your fists between your knees. This will focus your strengthening at your ankle.  · Slowly, pull your big toe up and in, making sure the band/tubing is positioned to resist the entire motion.  · Hold this position for __________ seconds.  · Have your muscles resist the band/tubing as it slowly pulls your foot back to the starting position.  Repeat __________ times. Complete this exercises __________ times per day.   Document Released: 01/15/2005 Document Revised: 04/09/2011 Document Reviewed: 04/29/2008  [employer]® Patient Information ©2014 [employer].

## 2013-01-05 NOTE — Progress Notes (Signed)
   Subjective:    Patient ID: Beth Johnston, female    DOB: 03-Sep-1925, 79 y.o.   MRN: [id number]  HPI Comments: "I've got a heel that's hurting"  Pt c/o of plantar heel right. Aches a lot after wearing a pair of different type of shoes. Notices pain AM and eases through the day. Been hurting for approx a month. Tried new shoe and insoles.  Foot Pain Associated symptoms include coughing.      Review of Systems  Respiratory: Positive for cough.   Musculoskeletal: Positive for gait problem.  All other systems reviewed and are negative.       Objective:   Physical Exam        Assessment & Plan:

## 2013-01-05 NOTE — Progress Notes (Signed)
Subjective:     Patient ID: Beth Johnston, female   DOB: Jan 28, 1926, 79 y.o.   MRN: [id number]  Foot Pain   patient presents stating I'm getting a lot of pain in my right heel and I cannot ambulate without discomfort. States it's been present for several months   Review of Systems  All other systems reviewed and are negative.       Objective:   Physical Exam  Nursing note and vitals reviewed. Constitutional: She is oriented to person, place, and time.  Cardiovascular: Intact distal pulses.   Musculoskeletal: Normal range of motion.  Neurological: She is oriented to person, place, and time.  Skin: Skin is warm.   patient has discomfort to the plantar heel right at the insertional point of the tendon into the calcaneus with fluid buildup. Neurovascular status intact with muscle strength adequate and no equinus condition noted     Assessment:     Plantar fasciitis right with inflammation and fluid at the insertion    Plan:     H&P and x-rays reviewed and today I injected the right plantar fascia 3 mg Kenalog 5 of Xylocaine Marcaine mixture and dispensed fascially brace. Reappoint one week

## 2013-01-15 ENCOUNTER — Ambulatory Visit (INDEPENDENT_AMBULATORY_CARE_PROVIDER_SITE_OTHER): Payer: Medicare Other | Admitting: Podiatry

## 2013-01-15 ENCOUNTER — Encounter: Payer: Self-pay | Admitting: Podiatry

## 2013-01-15 VITALS — BP 148/92 | HR 78 | Resp 12

## 2013-01-15 DIAGNOSIS — M722 Plantar fascial fibromatosis: Secondary | ICD-10-CM | POA: Diagnosis not present

## 2013-01-15 MED ORDER — TRIAMCINOLONE ACETONIDE 10 MG/ML IJ SUSP
10.0000 mg | Freq: Once | INTRAMUSCULAR | Status: AC
  Administered 2013-01-15: 10 mg

## 2013-01-15 NOTE — Progress Notes (Signed)
Subjective:     Patient ID: Beth Johnston, female   DOB: 11-07-1925, 79 y.o.   MRN: [id number]  HPI patient states IM improving but still having discomfort in my heel   Review of Systems     Objective:   Physical Exam Neurovascular status intact with discomfort plantar aspect right heel with some improvement from first visit    Assessment:     Plantar fasciitis noted right plantar heel at the insertion    Plan:     Reinjected the plantar fascia 3 mg Kenalog 5 mg Xylocaine Marcaine mixture and advised on continued brace usage and reduced activity

## 2013-01-15 NOTE — Progress Notes (Signed)
   Subjective:    Patient ID: Beth Johnston, female    DOB: 27-Jul-1925, 79 y.o.   MRN: [id number]  HPI Comments: '' THE RT FOOT HEEL IS STILL LITTLE SORE.''     Review of Systems     Objective:   Physical Exam        Assessment & Plan:

## 2013-01-27 DIAGNOSIS — M67919 Unspecified disorder of synovium and tendon, unspecified shoulder: Secondary | ICD-10-CM | POA: Diagnosis not present

## 2013-01-27 DIAGNOSIS — M25519 Pain in unspecified shoulder: Secondary | ICD-10-CM | POA: Diagnosis not present

## 2013-02-09 DIAGNOSIS — M719 Bursopathy, unspecified: Secondary | ICD-10-CM | POA: Diagnosis not present

## 2013-02-09 DIAGNOSIS — M67919 Unspecified disorder of synovium and tendon, unspecified shoulder: Secondary | ICD-10-CM | POA: Diagnosis not present

## 2013-02-09 DIAGNOSIS — M25519 Pain in unspecified shoulder: Secondary | ICD-10-CM | POA: Diagnosis not present

## 2013-02-16 ENCOUNTER — Other Ambulatory Visit: Payer: Self-pay | Admitting: Orthopaedic Surgery

## 2013-02-16 DIAGNOSIS — M25519 Pain in unspecified shoulder: Secondary | ICD-10-CM

## 2013-02-17 ENCOUNTER — Ambulatory Visit
Admission: RE | Admit: 2013-02-17 | Discharge: 2013-02-17 | Disposition: A | Discharge status: Home / Self Care | Payer: Medicare Other | Source: Ambulatory Visit | Attending: Orthopaedic Surgery | Admitting: Orthopaedic Surgery

## 2013-02-17 DIAGNOSIS — M19019 Primary osteoarthritis, unspecified shoulder: Secondary | ICD-10-CM | POA: Diagnosis not present

## 2013-02-17 DIAGNOSIS — M25519 Pain in unspecified shoulder: Secondary | ICD-10-CM

## 2013-02-18 DIAGNOSIS — M75120 Complete rotator cuff tear or rupture of unspecified shoulder, not specified as traumatic: Secondary | ICD-10-CM | POA: Diagnosis not present

## 2013-02-21 ENCOUNTER — Other Ambulatory Visit: Payer: Private Health Insurance - Indemnity

## 2013-02-23 DIAGNOSIS — M25519 Pain in unspecified shoulder: Secondary | ICD-10-CM | POA: Diagnosis not present

## 2013-02-23 DIAGNOSIS — M75120 Complete rotator cuff tear or rupture of unspecified shoulder, not specified as traumatic: Secondary | ICD-10-CM | POA: Diagnosis not present

## 2013-02-25 DIAGNOSIS — M25519 Pain in unspecified shoulder: Secondary | ICD-10-CM | POA: Diagnosis not present

## 2013-02-25 DIAGNOSIS — M75120 Complete rotator cuff tear or rupture of unspecified shoulder, not specified as traumatic: Secondary | ICD-10-CM | POA: Diagnosis not present

## 2013-02-26 DIAGNOSIS — M25519 Pain in unspecified shoulder: Secondary | ICD-10-CM | POA: Diagnosis not present

## 2013-02-26 DIAGNOSIS — M75120 Complete rotator cuff tear or rupture of unspecified shoulder, not specified as traumatic: Secondary | ICD-10-CM | POA: Diagnosis not present

## 2013-03-02 DIAGNOSIS — M25519 Pain in unspecified shoulder: Secondary | ICD-10-CM | POA: Diagnosis not present

## 2013-03-02 DIAGNOSIS — M75120 Complete rotator cuff tear or rupture of unspecified shoulder, not specified as traumatic: Secondary | ICD-10-CM | POA: Diagnosis not present

## 2013-03-05 DIAGNOSIS — M25519 Pain in unspecified shoulder: Secondary | ICD-10-CM | POA: Diagnosis not present

## 2013-03-05 DIAGNOSIS — M75120 Complete rotator cuff tear or rupture of unspecified shoulder, not specified as traumatic: Secondary | ICD-10-CM | POA: Diagnosis not present

## 2013-03-06 DIAGNOSIS — M25519 Pain in unspecified shoulder: Secondary | ICD-10-CM | POA: Diagnosis not present

## 2013-03-06 DIAGNOSIS — M75120 Complete rotator cuff tear or rupture of unspecified shoulder, not specified as traumatic: Secondary | ICD-10-CM | POA: Diagnosis not present

## 2013-03-10 DIAGNOSIS — M75120 Complete rotator cuff tear or rupture of unspecified shoulder, not specified as traumatic: Secondary | ICD-10-CM | POA: Diagnosis not present

## 2013-03-10 DIAGNOSIS — M25519 Pain in unspecified shoulder: Secondary | ICD-10-CM | POA: Diagnosis not present

## 2013-03-12 DIAGNOSIS — M25519 Pain in unspecified shoulder: Secondary | ICD-10-CM | POA: Diagnosis not present

## 2013-03-12 DIAGNOSIS — M75120 Complete rotator cuff tear or rupture of unspecified shoulder, not specified as traumatic: Secondary | ICD-10-CM | POA: Diagnosis not present

## 2013-03-13 DIAGNOSIS — M25519 Pain in unspecified shoulder: Secondary | ICD-10-CM | POA: Diagnosis not present

## 2013-03-13 DIAGNOSIS — M75120 Complete rotator cuff tear or rupture of unspecified shoulder, not specified as traumatic: Secondary | ICD-10-CM | POA: Diagnosis not present

## 2013-03-16 DIAGNOSIS — M75120 Complete rotator cuff tear or rupture of unspecified shoulder, not specified as traumatic: Secondary | ICD-10-CM | POA: Diagnosis not present

## 2013-03-16 DIAGNOSIS — M25519 Pain in unspecified shoulder: Secondary | ICD-10-CM | POA: Diagnosis not present

## 2013-03-19 DIAGNOSIS — M25519 Pain in unspecified shoulder: Secondary | ICD-10-CM | POA: Diagnosis not present

## 2013-03-19 DIAGNOSIS — M75120 Complete rotator cuff tear or rupture of unspecified shoulder, not specified as traumatic: Secondary | ICD-10-CM | POA: Diagnosis not present

## 2013-03-20 DIAGNOSIS — M25519 Pain in unspecified shoulder: Secondary | ICD-10-CM | POA: Diagnosis not present

## 2013-03-20 DIAGNOSIS — M75120 Complete rotator cuff tear or rupture of unspecified shoulder, not specified as traumatic: Secondary | ICD-10-CM | POA: Diagnosis not present

## 2013-03-25 DIAGNOSIS — M25519 Pain in unspecified shoulder: Secondary | ICD-10-CM | POA: Diagnosis not present

## 2013-04-22 DIAGNOSIS — M25519 Pain in unspecified shoulder: Secondary | ICD-10-CM | POA: Diagnosis not present

## 2013-05-04 DIAGNOSIS — R0609 Other forms of dyspnea: Secondary | ICD-10-CM | POA: Diagnosis not present

## 2013-05-04 DIAGNOSIS — J209 Acute bronchitis, unspecified: Secondary | ICD-10-CM | POA: Diagnosis not present

## 2013-05-04 DIAGNOSIS — J060 Acute laryngopharyngitis: Secondary | ICD-10-CM | POA: Diagnosis not present

## 2013-05-11 ENCOUNTER — Telehealth: Payer: Self-pay | Admitting: Internal Medicine

## 2013-05-11 ENCOUNTER — Ambulatory Visit (INDEPENDENT_AMBULATORY_CARE_PROVIDER_SITE_OTHER): Payer: Medicare Other | Admitting: Pulmonary Disease

## 2013-05-11 ENCOUNTER — Ambulatory Visit (INDEPENDENT_AMBULATORY_CARE_PROVIDER_SITE_OTHER)
Admission: RE | Admit: 2013-05-11 | Discharge: 2013-05-11 | Disposition: A | Discharge status: Home / Self Care | Payer: Medicare Other | Source: Ambulatory Visit | Attending: Pulmonary Disease | Admitting: Pulmonary Disease

## 2013-05-11 ENCOUNTER — Encounter: Payer: Self-pay | Admitting: Pulmonary Disease

## 2013-05-11 VITALS — BP 152/90 | HR 90 | Ht 66.0 in | Wt 165.0 lb

## 2013-05-11 DIAGNOSIS — R059 Cough, unspecified: Secondary | ICD-10-CM | POA: Insufficient documentation

## 2013-05-11 DIAGNOSIS — R05 Cough: Secondary | ICD-10-CM

## 2013-05-11 DIAGNOSIS — J40 Bronchitis, not specified as acute or chronic: Secondary | ICD-10-CM | POA: Diagnosis not present

## 2013-05-11 MED ORDER — BUDESONIDE-FORMOTEROL FUMARATE 160-4.5 MCG/ACT IN AERO: 2.0000 | INHALATION_SPRAY | Freq: Two times a day (BID) | RESPIRATORY_TRACT | Status: DC

## 2013-05-11 NOTE — Assessment & Plan Note (Signed)
She reports yearly episodes of bronchitis in Spring, that she gets prednisone and antibiotics for.  She has recurrence of this pattern over the past few weeks.  However, she has not improved with prednisone therapy.  She has significant wheeze on exam today that improved with nebulizer therapy.  She might have asthmatic bronchitis with allergy component.    I don't think she needs additional prednisone at this time.  Also, I don't think she has a bacterial infection and therefore will defer antibiotics.  Will give her trial of symbicort >> have given sample and showed how to use this.  Will also get CXR and lab work.  Will have her f/u in 2 weeks, and then determine if she needs additional evaluation/therapy for asthma/allergies.

## 2013-05-11 NOTE — Telephone Encounter (Signed)
spok

## 2013-05-11 NOTE — Telephone Encounter (Signed)
Spoke with daughter. Pt has not been seen since 2008 by [facility]. Pt scheduled to come in and see VS at 10:30 this AM. Nothing further needed

## 2013-05-11 NOTE — Progress Notes (Signed)
Chief Complaint  Patient presents with  . Pulmonary Consult    last seen by pulmonary in 2008    History of Present Illness: Beth Johnston is a 80 y.o. female for evaluation of cough.  This started about 2 weeks ago.  She brings up clear sputum.  She also hears wheezing in her chest.  She denies hemoptysis, sinus congestion, fever, chest pain, sore throat, ear pain, or skin rashes.  She has not had sick exposures.  She gets bronchitis every Spring, but denies history of allergies/asthma.  She has not been on inhalers recently.  She was given prednisone by her PCP, but this did not seem to help.  She usually gets antibiotics when she has bronchitis, but she does not think she was on antibiotics with this episode.  She denies history of pneumonia.  She has not had recent CXR or PFT's.  She never smoked, but her husband did.  Beth Johnston  has a past medical history of PONV (postoperative nausea and vomiting) and Arthritis.  Beth Johnston  has past surgical history that includes Replacement total knee bilateral (2009); Appendectomy; and Cystocele repair (12/18/2011).  Prior to Admission medications   Not on File    No Known Allergies  Her family history is not on file.  She  reports that she has never smoked. She has never used smokeless tobacco. She reports that she does not drink alcohol or use illicit drugs.  Review of Systems  Constitutional: Negative for fever, chills, diaphoresis, activity change, appetite change, fatigue and unexpected weight change.  HENT: Negative for congestion, dental problem, ear discharge, ear pain, facial swelling, hearing loss, mouth sores, nosebleeds, postnasal drip, rhinorrhea, sinus pressure, sneezing, sore throat, tinnitus, trouble swallowing and voice change.   Eyes: Negative for photophobia, discharge, itching and visual disturbance.  Respiratory: Positive for cough. Negative for apnea, choking, chest tightness, shortness of breath, wheezing  and stridor.   Cardiovascular: Negative for chest pain, palpitations and leg swelling.  Gastrointestinal: Negative for nausea, vomiting, abdominal pain, constipation, blood in stool and abdominal distention.  Genitourinary: Negative for dysuria, urgency, frequency, hematuria, flank pain, decreased urine volume and difficulty urinating.  Musculoskeletal: Negative for arthralgias, back pain, gait problem, joint swelling, myalgias, neck pain and neck stiffness.  Skin: Negative for color change, pallor and rash.  Neurological: Negative for dizziness, tremors, seizures, syncope, speech difficulty, weakness, light-headedness, numbness and headaches.  Hematological: Negative for adenopathy. Does not bruise/bleed easily.  Psychiatric/Behavioral: Negative for confusion, sleep disturbance and agitation. The patient is not nervous/anxious.    Physical Exam:  General - No distress ENT - No sinus tenderness, no oral exudate, no LAN, no thyromegaly, TM clear, pupils equal/reactive Cardiac - s1s2 regular, no murmur, pulses symmetric Chest - b/l diffuse wheeze Back - No focal tenderness Abd - Soft, non-tender, no organomegaly, + bowel sounds Ext - No edema Neuro - Normal strength, cranial nerves intact Skin - No rashes Psych - Normal mood, and behavior  Assessment/Plan:  Chesley Mires, MD [facility]/Sleep Pager:  [phone]

## 2013-05-11 NOTE — Progress Notes (Deleted)
   Subjective:    Patient ID: Beth Johnston, female    DOB: 20-Aug-1925, 80 y.o.   MRN: [id number]  HPI    Review of Systems  Constitutional: Negative for fever, chills, diaphoresis, activity change, appetite change, fatigue and unexpected weight change.  HENT: Negative for congestion, dental problem, ear discharge, ear pain, facial swelling, hearing loss, mouth sores, nosebleeds, postnasal drip, rhinorrhea, sinus pressure, sneezing, sore throat, tinnitus, trouble swallowing and voice change.   Eyes: Negative for photophobia, discharge, itching and visual disturbance.  Respiratory: Positive for cough. Negative for apnea, choking, chest tightness, shortness of breath, wheezing and stridor.   Cardiovascular: Negative for chest pain, palpitations and leg swelling.  Gastrointestinal: Negative for nausea, vomiting, abdominal pain, constipation, blood in stool and abdominal distention.  Genitourinary: Negative for dysuria, urgency, frequency, hematuria, flank pain, decreased urine volume and difficulty urinating.  Musculoskeletal: Negative for arthralgias, back pain, gait problem, joint swelling, myalgias, neck pain and neck stiffness.  Skin: Negative for color change, pallor and rash.  Neurological: Negative for dizziness, tremors, seizures, syncope, speech difficulty, weakness, light-headedness, numbness and headaches.  Hematological: Negative for adenopathy. Does not bruise/bleed easily.  Psychiatric/Behavioral: Negative for confusion, sleep disturbance and agitation. The patient is not nervous/anxious.        Objective:   Physical Exam        Assessment & Plan:

## 2013-05-11 NOTE — Patient Instructions (Signed)
Chest xray and lab work today Symbicort two puffs twice per day >> rinse mouth after each use Follow up in 2 weeks

## 2013-05-13 DIAGNOSIS — H25019 Cortical age-related cataract, unspecified eye: Secondary | ICD-10-CM | POA: Diagnosis not present

## 2013-05-13 DIAGNOSIS — H2510 Age-related nuclear cataract, unspecified eye: Secondary | ICD-10-CM | POA: Diagnosis not present

## 2013-05-13 DIAGNOSIS — H524 Presbyopia: Secondary | ICD-10-CM | POA: Diagnosis not present

## 2013-05-14 ENCOUNTER — Telehealth: Payer: Self-pay | Admitting: Pulmonary Disease

## 2013-05-14 NOTE — Telephone Encounter (Signed)
Dg Chest 2 View  05/11/2013   CLINICAL DATA:  Cough and dyspnea.  EXAM: CHEST  2 VIEW  COMPARISON:  10/14/2006  FINDINGS: There is increased cardiomegaly. There is peribronchial thickening consistent with bronchitis. No infiltrates or effusions. No acute osseous abnormality.  IMPRESSION: Bronchitic changes.  Increased cardiomegaly.   Electronically Signed   By: Rozetta Nunnery M.D.   On: 05/11/2013 14:57   Will have my nurse inform pt that CXR shows changes from bronchitis.  No change to current treatment plan needed.  Will discuss in more detail at next visit.

## 2013-05-15 NOTE — Telephone Encounter (Signed)
Pt is aware of results. 

## 2013-05-28 ENCOUNTER — Encounter: Payer: Self-pay | Admitting: Pulmonary Disease

## 2013-05-28 ENCOUNTER — Ambulatory Visit (INDEPENDENT_AMBULATORY_CARE_PROVIDER_SITE_OTHER): Payer: Medicare Other | Admitting: Pulmonary Disease

## 2013-05-28 VITALS — BP 154/84 | HR 84 | Ht 66.0 in | Wt 162.0 lb

## 2013-05-28 DIAGNOSIS — R05 Cough: Secondary | ICD-10-CM | POA: Diagnosis not present

## 2013-05-28 DIAGNOSIS — R058 Other specified cough: Secondary | ICD-10-CM

## 2013-05-28 DIAGNOSIS — R059 Cough, unspecified: Secondary | ICD-10-CM | POA: Diagnosis not present

## 2013-05-28 DIAGNOSIS — J45909 Unspecified asthma, uncomplicated: Secondary | ICD-10-CM | POA: Diagnosis not present

## 2013-05-28 DIAGNOSIS — J45998 Other asthma: Secondary | ICD-10-CM

## 2013-05-28 MED ORDER — FLUTICASONE PROPIONATE 50 MCG/ACT NA SUSP: 2.0000 | Freq: Every day | NASAL | Status: DC

## 2013-05-28 NOTE — Progress Notes (Signed)
Chief Complaint  Patient presents with  . Follow-up    Breathing is greatly improved since last OV. Symbicort has helpled with cough and wheezing.    History of Present Illness: Beth Johnston is a 80 y.o. female with cough from asthma, and post-nasal drip.  She is doing much better.  She is not having cough, or wheeze.  She is not bringing up sputum.  Symbicort has helped, but she can't afford this >> told it would cost $360 per month.  She still gets throat congestion and clears her throat frequently.  Beth Johnston  has a past medical history of PONV (postoperative nausea and vomiting); Arthritis; Asthma; and Upper airway cough syndrome.  Beth Johnston  has past surgical history that includes Replacement total knee bilateral (2009); Appendectomy; and Cystocele repair (12/18/2011).  Prior to Admission medications   Medication Sig Start Date End Date Taking? Authorizing Provider  budesonide-formoterol (SYMBICORT) 160-4.5 MCG/ACT inhaler Inhale 2 puffs into the lungs 2 (two) times daily. 05/11/13  Yes Chesley Mires, MD    No Known Allergies   Physical Exam:  General - No distress ENT - No sinus tenderness, no oral exudate, no LAN Cardiac - s1s2 regular, no murmur Chest - No wheeze/rales/dullness Back - No focal tenderness Abd - Soft, non-tender Ext - No edema Neuro - Normal strength Skin - No rashes Psych - normal mood, and behavior   Assessment/Plan:  Chesley Mires, MD [facility]Critical [facility]/Sleep Pager:  [phone]

## 2013-05-28 NOTE — Assessment & Plan Note (Signed)
Much improved with symbicort >> unfortunately she can't afford this.  I have given her samples to continue therapy for now.  She will check with her insurance company about whether there is less expensive alternative to symbicort.  She does not feel like she needs a rescue inhaler at this time.

## 2013-05-28 NOTE — Assessment & Plan Note (Signed)
Related to asthma, and post-nasal drip.  Much improved.

## 2013-05-28 NOTE — Patient Instructions (Signed)
Continue with symbicort two sprays twice per day for now.  Check with your insurance company about whether there is a less expensive alternative to symbicort, and call our office with this information. Flonase two sprays each nostril daily for two weeks, then as needed for sinus congestion or post-nasal drip Follow up in 4 months

## 2013-05-28 NOTE — Assessment & Plan Note (Signed)
Likely from post-nasal drip.  Will have her try using flonase.

## 2013-08-31 DIAGNOSIS — H2510 Age-related nuclear cataract, unspecified eye: Secondary | ICD-10-CM | POA: Diagnosis not present

## 2013-09-28 DIAGNOSIS — J209 Acute bronchitis, unspecified: Secondary | ICD-10-CM | POA: Diagnosis not present

## 2013-09-28 DIAGNOSIS — J0100 Acute maxillary sinusitis, unspecified: Secondary | ICD-10-CM | POA: Diagnosis not present

## 2013-11-05 DIAGNOSIS — H2511 Age-related nuclear cataract, right eye: Secondary | ICD-10-CM | POA: Diagnosis not present

## 2013-11-05 DIAGNOSIS — H25012 Cortical age-related cataract, left eye: Secondary | ICD-10-CM | POA: Diagnosis not present

## 2013-11-05 DIAGNOSIS — H25011 Cortical age-related cataract, right eye: Secondary | ICD-10-CM | POA: Diagnosis not present

## 2013-11-05 DIAGNOSIS — H2512 Age-related nuclear cataract, left eye: Secondary | ICD-10-CM | POA: Diagnosis not present

## 2013-12-09 DIAGNOSIS — D225 Melanocytic nevi of trunk: Secondary | ICD-10-CM | POA: Diagnosis not present

## 2013-12-09 DIAGNOSIS — L570 Actinic keratosis: Secondary | ICD-10-CM | POA: Diagnosis not present

## 2013-12-09 DIAGNOSIS — D485 Neoplasm of uncertain behavior of skin: Secondary | ICD-10-CM | POA: Diagnosis not present

## 2013-12-09 DIAGNOSIS — L821 Other seborrheic keratosis: Secondary | ICD-10-CM | POA: Diagnosis not present

## 2013-12-25 ENCOUNTER — Inpatient Hospital Stay (HOSPITAL_COMMUNITY)
Admission: EM | Admit: 2013-12-25 | Discharge: 2014-01-05 | DRG: 216 | Disposition: A | Discharge status: Home / Self Care | Payer: Medicare Other | Attending: Internal Medicine | Admitting: Internal Medicine

## 2013-12-25 ENCOUNTER — Emergency Department (HOSPITAL_COMMUNITY): Payer: Medicare Other

## 2013-12-25 ENCOUNTER — Encounter (HOSPITAL_COMMUNITY): Payer: Self-pay | Admitting: Emergency Medicine

## 2013-12-25 DIAGNOSIS — I214 Non-ST elevation (NSTEMI) myocardial infarction: Principal | ICD-10-CM | POA: Diagnosis present

## 2013-12-25 DIAGNOSIS — R7989 Other specified abnormal findings of blood chemistry: Secondary | ICD-10-CM | POA: Diagnosis not present

## 2013-12-25 DIAGNOSIS — I253 Aneurysm of heart: Secondary | ICD-10-CM | POA: Diagnosis present

## 2013-12-25 DIAGNOSIS — Z9861 Coronary angioplasty status: Secondary | ICD-10-CM | POA: Diagnosis not present

## 2013-12-25 DIAGNOSIS — R1031 Right lower quadrant pain: Secondary | ICD-10-CM | POA: Diagnosis not present

## 2013-12-25 DIAGNOSIS — I272 Other secondary pulmonary hypertension: Secondary | ICD-10-CM | POA: Diagnosis present

## 2013-12-25 DIAGNOSIS — Z9889 Other specified postprocedural states: Secondary | ICD-10-CM | POA: Diagnosis not present

## 2013-12-25 DIAGNOSIS — D649 Anemia, unspecified: Secondary | ICD-10-CM | POA: Diagnosis present

## 2013-12-25 DIAGNOSIS — J45998 Other asthma: Secondary | ICD-10-CM | POA: Diagnosis present

## 2013-12-25 DIAGNOSIS — I2510 Atherosclerotic heart disease of native coronary artery without angina pectoris: Secondary | ICD-10-CM | POA: Diagnosis present

## 2013-12-25 DIAGNOSIS — I447 Left bundle-branch block, unspecified: Secondary | ICD-10-CM

## 2013-12-25 DIAGNOSIS — I059 Rheumatic mitral valve disease, unspecified: Secondary | ICD-10-CM

## 2013-12-25 DIAGNOSIS — I255 Ischemic cardiomyopathy: Secondary | ICD-10-CM | POA: Diagnosis present

## 2013-12-25 DIAGNOSIS — R0602 Shortness of breath: Secondary | ICD-10-CM

## 2013-12-25 DIAGNOSIS — I429 Cardiomyopathy, unspecified: Secondary | ICD-10-CM | POA: Diagnosis present

## 2013-12-25 DIAGNOSIS — Z955 Presence of coronary angioplasty implant and graft: Secondary | ICD-10-CM | POA: Diagnosis not present

## 2013-12-25 DIAGNOSIS — I5021 Acute systolic (congestive) heart failure: Secondary | ICD-10-CM | POA: Diagnosis present

## 2013-12-25 DIAGNOSIS — I252 Old myocardial infarction: Secondary | ICD-10-CM | POA: Diagnosis present

## 2013-12-25 DIAGNOSIS — I2582 Chronic total occlusion of coronary artery: Secondary | ICD-10-CM | POA: Diagnosis present

## 2013-12-25 DIAGNOSIS — I25750 Atherosclerosis of native coronary artery of transplanted heart with unstable angina: Secondary | ICD-10-CM | POA: Diagnosis not present

## 2013-12-25 DIAGNOSIS — I517 Cardiomegaly: Secondary | ICD-10-CM | POA: Diagnosis not present

## 2013-12-25 DIAGNOSIS — J45909 Unspecified asthma, uncomplicated: Secondary | ICD-10-CM | POA: Diagnosis present

## 2013-12-25 DIAGNOSIS — T148XXA Other injury of unspecified body region, initial encounter: Secondary | ICD-10-CM

## 2013-12-25 DIAGNOSIS — I472 Ventricular tachycardia: Secondary | ICD-10-CM | POA: Diagnosis not present

## 2013-12-25 DIAGNOSIS — D500 Iron deficiency anemia secondary to blood loss (chronic): Secondary | ICD-10-CM | POA: Diagnosis not present

## 2013-12-25 DIAGNOSIS — R778 Other specified abnormalities of plasma proteins: Secondary | ICD-10-CM

## 2013-12-25 DIAGNOSIS — I340 Nonrheumatic mitral (valve) insufficiency: Secondary | ICD-10-CM | POA: Diagnosis present

## 2013-12-25 DIAGNOSIS — I729 Aneurysm of unspecified site: Secondary | ICD-10-CM | POA: Diagnosis not present

## 2013-12-25 DIAGNOSIS — I420 Dilated cardiomyopathy: Secondary | ICD-10-CM | POA: Diagnosis present

## 2013-12-25 DIAGNOSIS — J810 Acute pulmonary edema: Secondary | ICD-10-CM

## 2013-12-25 DIAGNOSIS — J42 Unspecified chronic bronchitis: Secondary | ICD-10-CM | POA: Diagnosis present

## 2013-12-25 DIAGNOSIS — I509 Heart failure, unspecified: Secondary | ICD-10-CM | POA: Diagnosis not present

## 2013-12-25 DIAGNOSIS — T81718A Complication of other artery following a procedure, not elsewhere classified, initial encounter: Secondary | ICD-10-CM

## 2013-12-25 DIAGNOSIS — I5020 Unspecified systolic (congestive) heart failure: Secondary | ICD-10-CM | POA: Diagnosis present

## 2013-12-25 DIAGNOSIS — R069 Unspecified abnormalities of breathing: Secondary | ICD-10-CM | POA: Diagnosis not present

## 2013-12-25 DIAGNOSIS — M79609 Pain in unspecified limb: Secondary | ICD-10-CM | POA: Diagnosis not present

## 2013-12-25 DIAGNOSIS — I998 Other disorder of circulatory system: Secondary | ICD-10-CM | POA: Diagnosis not present

## 2013-12-25 DIAGNOSIS — J9600 Acute respiratory failure, unspecified whether with hypoxia or hypercapnia: Secondary | ICD-10-CM | POA: Diagnosis not present

## 2013-12-25 HISTORY — DX: Ischemic cardiomyopathy: I25.5

## 2013-12-25 HISTORY — DX: Left bundle-branch block, unspecified: I44.7

## 2013-12-25 LAB — I-STAT TROPONIN, ED: Troponin i, poc: 0.14 ng/mL (ref 0.00–0.08)

## 2013-12-25 LAB — BASIC METABOLIC PANEL
ANION GAP: 13 (ref 5–15)
BUN: 29 mg/dL — AB (ref 6–23)
CALCIUM: 8.7 mg/dL (ref 8.4–10.5)
CO2: 23 mEq/L (ref 19–32)
CREATININE: 0.78 mg/dL (ref 0.50–1.10)
Chloride: 110 mEq/L (ref 96–112)
GFR, EST AFRICAN AMERICAN: 85 mL/min — AB (ref >90)
GFR, EST NON AFRICAN AMERICAN: 74 mL/min — AB (ref >90)
Glucose, Bld: 223 mg/dL — ABNORMAL HIGH (ref 70–99)
Potassium: 4.2 mEq/L (ref 3.7–5.3)
Sodium: 146 mEq/L (ref 137–147)

## 2013-12-25 LAB — CBC
HEMATOCRIT: 36.2 % (ref 36.0–46.0)
Hemoglobin: 11.2 g/dL — ABNORMAL LOW (ref 12.0–15.0)
MCH: 25.1 pg — ABNORMAL LOW (ref 26.0–34.0)
MCHC: 30.9 g/dL (ref 30.0–36.0)
MCV: 81 fL (ref 78.0–100.0)
Platelets: 197 10*3/uL (ref 150–400)
RBC: 4.47 MIL/uL (ref 3.87–5.11)
RDW: 15.2 % (ref 11.5–15.5)
WBC: 6.5 10*3/uL (ref 4.0–10.5)

## 2013-12-25 LAB — TROPONIN I
TROPONIN I: 10.6 ng/mL — AB (ref <0.30)
Troponin I: 0.66 ng/mL (ref <0.30)
Troponin I: 18.8 ng/mL (ref <0.30)

## 2013-12-25 LAB — PRO B NATRIURETIC PEPTIDE: Pro B Natriuretic peptide (BNP): 1516 pg/mL — ABNORMAL HIGH (ref 0–450)

## 2013-12-25 LAB — HEPARIN LEVEL (UNFRACTIONATED): HEPARIN UNFRACTIONATED: 0.18 [IU]/mL — AB (ref 0.30–0.70)

## 2013-12-25 LAB — MRSA PCR SCREENING: MRSA by PCR: NEGATIVE

## 2013-12-25 MED ORDER — BUDESONIDE-FORMOTEROL FUMARATE 160-4.5 MCG/ACT IN AERO
2.0000 | INHALATION_SPRAY | Freq: Two times a day (BID) | RESPIRATORY_TRACT | Status: DC
  Filled 2013-12-25: qty 6

## 2013-12-25 MED ORDER — HEPARIN BOLUS VIA INFUSION
2000.0000 [IU] | Freq: Once | INTRAVENOUS | Status: AC
  Administered 2013-12-25: 2000 [IU] via INTRAVENOUS
  Filled 2013-12-25: qty 2000

## 2013-12-25 MED ORDER — FUROSEMIDE 10 MG/ML IJ SOLN
40.0000 mg | Freq: Two times a day (BID) | INTRAMUSCULAR | Status: DC
  Administered 2013-12-25 – 2013-12-27 (×5): 40 mg via INTRAVENOUS
  Filled 2013-12-25 (×8): qty 4

## 2013-12-25 MED ORDER — FUROSEMIDE 10 MG/ML IJ SOLN
40.0000 mg | Freq: Once | INTRAMUSCULAR | Status: AC
Start: 2013-12-25 — End: 2013-12-25
  Administered 2013-12-25: 40 mg via INTRAVENOUS
  Filled 2013-12-25: qty 4

## 2013-12-25 MED ORDER — HEPARIN (PORCINE) IN NACL 100-0.45 UNIT/ML-% IJ SOLN
700.0000 [IU]/h | INTRAMUSCULAR | Status: DC
  Administered 2013-12-25: 700 [IU]/h via INTRAVENOUS
  Filled 2013-12-25: qty 250

## 2013-12-25 MED ORDER — ASPIRIN 81 MG PO CHEW
324.0000 mg | CHEWABLE_TABLET | Freq: Once | ORAL | Status: AC
  Administered 2013-12-25: 324 mg via ORAL
  Filled 2013-12-25: qty 4

## 2013-12-25 MED ORDER — BUDESONIDE-FORMOTEROL FUMARATE 160-4.5 MCG/ACT IN AERO
2.0000 | INHALATION_SPRAY | Freq: Two times a day (BID) | RESPIRATORY_TRACT | Status: DC
  Administered 2013-12-25 – 2014-01-05 (×12): 2 via RESPIRATORY_TRACT
  Filled 2013-12-25: qty 6

## 2013-12-25 MED ORDER — ACETAMINOPHEN 325 MG PO TABS: 650.0000 mg | ORAL_TABLET | ORAL | Status: DC | PRN

## 2013-12-25 MED ORDER — SODIUM CHLORIDE 0.9 % IJ SOLN: 3.0000 mL | INTRAMUSCULAR | Status: DC | PRN

## 2013-12-25 MED ORDER — ONDANSETRON HCL 4 MG/2ML IJ SOLN: 4.0000 mg | Freq: Four times a day (QID) | INTRAMUSCULAR | Status: DC | PRN

## 2013-12-25 MED ORDER — NITROGLYCERIN IN D5W 200-5 MCG/ML-% IV SOLN
0.0000 ug/min | Freq: Once | INTRAVENOUS | Status: AC
  Administered 2013-12-25: 5 ug/min via INTRAVENOUS
  Filled 2013-12-25: qty 250

## 2013-12-25 MED ORDER — HEPARIN (PORCINE) IN NACL 100-0.45 UNIT/ML-% IJ SOLN
1050.0000 [IU]/h | INTRAMUSCULAR | Status: DC
  Administered 2013-12-25 – 2013-12-26 (×2): 900 [IU]/h via INTRAVENOUS
  Administered 2013-12-27: 1050 [IU]/h via INTRAVENOUS
  Filled 2013-12-25 (×2): qty 250

## 2013-12-25 MED ORDER — CARVEDILOL 3.125 MG PO TABS
3.1250 mg | ORAL_TABLET | Freq: Two times a day (BID) | ORAL | Status: DC
  Administered 2013-12-25 – 2013-12-26 (×3): 3.125 mg via ORAL
  Filled 2013-12-25 (×6): qty 1

## 2013-12-25 MED ORDER — SODIUM CHLORIDE 0.9 % IV SOLN
Freq: Once | INTRAVENOUS | Status: AC
  Administered 2013-12-25: 08:00:00 via INTRAVENOUS

## 2013-12-25 MED ORDER — HEPARIN BOLUS VIA INFUSION
3000.0000 [IU] | Freq: Once | INTRAVENOUS | Status: AC
  Administered 2013-12-25: 3000 [IU] via INTRAVENOUS
  Filled 2013-12-25: qty 3000

## 2013-12-25 MED ORDER — LEVALBUTEROL HCL 0.63 MG/3ML IN NEBU
0.6300 mg | INHALATION_SOLUTION | Freq: Three times a day (TID) | RESPIRATORY_TRACT | Status: DC
  Administered 2013-12-25: 0.63 mg via RESPIRATORY_TRACT
  Filled 2013-12-25 (×5): qty 3

## 2013-12-25 MED ORDER — SODIUM CHLORIDE 0.9 % IJ SOLN
3.0000 mL | Freq: Two times a day (BID) | INTRAMUSCULAR | Status: DC
  Administered 2013-12-26: 3 mL via INTRAVENOUS

## 2013-12-25 MED ORDER — ASPIRIN EC 81 MG PO TBEC
81.0000 mg | DELAYED_RELEASE_TABLET | Freq: Every day | ORAL | Status: DC
  Administered 2013-12-26 – 2013-12-27 (×2): 81 mg via ORAL
  Filled 2013-12-25 (×5): qty 1

## 2013-12-25 MED ORDER — SODIUM CHLORIDE 0.9 % IV SOLN: 250.0000 mL | INTRAVENOUS | Status: DC | PRN

## 2013-12-25 MED ORDER — FUROSEMIDE 10 MG/ML IJ SOLN
40.0000 mg | Freq: Once | INTRAMUSCULAR | Status: AC
  Administered 2013-12-25: 40 mg via INTRAVENOUS
  Filled 2013-12-25: qty 4

## 2013-12-25 NOTE — ED Notes (Signed)
DR. Mingo Amber ADVISED RN TO HOLD NTG DRIP AT THIS TIME .

## 2013-12-25 NOTE — ED Notes (Signed)
Attempted report 

## 2013-12-25 NOTE — Progress Notes (Signed)
ANTICOAGULATION CONSULT NOTE - Initial Consult  Pharmacy Consult for Heparin Indication: chest pain/ACS  No Known Allergies  Patient Measurements: Height: 5' 6.14" (168 cm) Weight: 155 lb (70.308 kg) IBW/kg (Calculated) : 59.63   Vital Signs: BP: 136/64 mmHg (11/27 0557) Pulse Rate: 93 (11/27 0557)  Labs:  Recent Labs  12/25/13 0609  HGB 11.2*  HCT 36.2  PLT 197  CREATININE 0.78  TROPONINI 0.66*    Estimated Creatinine Clearance: 47.5 mL/min (by C-G formula based on Cr of 0.78).   Medical History: Past Medical History  Diagnosis Date  . PONV (postoperative nausea and vomiting)   . Arthritis     hands - no meds  . Asthma   . Upper airway cough syndrome     Post-nasal drip    Medications:  Symbicort  Assessment: 80 yo female with SOB, elevated cardiac markers, for heparin  Goal of Therapy:  Heparin level 0.3-0.7 units/ml Monitor platelets by anticoagulation protocol: Yes   Plan:  Heparin 3000 units IV bolus, then start heparin 700 units/hr Check heparin level in 8 hours.   Caryl Pina 12/25/2013,7:00 AM

## 2013-12-25 NOTE — Plan of Care (Signed)
Problem: Consults Goal: Heart Failure Patient Education (See Patient Education module for education specifics.) Outcome: Completed/Met Date Met:  12/25/13

## 2013-12-25 NOTE — Progress Notes (Signed)
ANTICOAGULATION CONSULT NOTE - FOLLOW UP    HL = 0.18 (goal 0.3 - 0.7 units/mL) Heparin dosing weight = 68 kg   Assessment: 88 YOF on IV heparin for ACS.  Heparin level sub-therapeutic; no bleeding reported.  Verified with RN from the ED and 2H that heparin was not interrupted.    Plan: - Heparin 2000 units IV bolus x 1, then - Increase heparin gtt to 900 units/hr - Check 8 hr HL post rate adjustment    Jb Dulworth D. Mina Marble, PharmD, BCPS 12/25/2013, 4:00 PM

## 2013-12-25 NOTE — ED Notes (Signed)
Pt. arrived with EMS from work reports SOB with productive cough , chest congestion onset this evening , pt. received CPAP during transport with relief , denies chest pain  .

## 2013-12-25 NOTE — ED Notes (Signed)
Placed pt on 2 L nasal cannula.  SpO2 decreased to 85-88% RA during movement in bed for urination.

## 2013-12-25 NOTE — ED Provider Notes (Signed)
CSN: [id number]     Arrival date & time 12/25/13  0605 History   First MD Initiated Contact with Patient 12/25/13 0550     Chief Complaint  Patient presents with  . Shortness of Breath     (Consider location/radiation/quality/duration/timing/severity/associated sxs/prior Treatment) Patient is a 80 y.o. female presenting with shortness of breath.  Shortness of Breath Severity:  Severe Onset quality:  Sudden Duration:  1 hour Timing:  Constant Progression:  Unchanged Chronicity:  New Context: not URI   Relieved by:  Nothing Worsened by:  Nothing tried Associated symptoms: diaphoresis   Associated symptoms: no chest pain, no cough, no rash and no vomiting     Past Medical History  Diagnosis Date  . PONV (postoperative nausea and vomiting)   . Arthritis     hands - no meds  . Asthma   . Upper airway cough syndrome     Post-nasal drip   Past Surgical History  Procedure Laterality Date  . Replacement total knee bilateral  2009  . Appendectomy    . Cystocele repair  12/18/2011    Procedure: ANTERIOR REPAIR (CYSTOCELE);  Surgeon: Gus Height, MD;  Location: [facility];  Service: Gynecology;  Laterality: N/A;   No family history on file. History  Substance Use Topics  . Smoking status: Never Smoker   . Smokeless tobacco: Never Used  . Alcohol Use: No   OB History    No data available     Review of Systems  Constitutional: Positive for diaphoresis.  Respiratory: Positive for shortness of breath. Negative for cough.   Cardiovascular: Negative for chest pain.  Gastrointestinal: Negative for vomiting.  Skin: Negative for rash.  All other systems reviewed and are negative.     Allergies  Review of patient's allergies indicates no known allergies.  Home Medications   Prior to Admission medications   Medication Sig Start Date End Date Taking? Authorizing Provider  budesonide-formoterol (SYMBICORT) 160-4.5 MCG/ACT inhaler Inhale 2 puffs into the lungs 2 (two) times  daily. 05/11/13   Chesley Mires, MD  fluticasone (FLONASE) 50 MCG/ACT nasal spray Place 2 sprays into both nostrils daily. 05/28/13   Chesley Mires, MD   BP 136/64 mmHg  Pulse 93  Resp 23  SpO2 99% Physical Exam  Constitutional: She is oriented to person, place, and time. She appears well-developed and well-nourished. She appears distressed.  HENT:  Head: Normocephalic and atraumatic.  Mouth/Throat: Oropharynx is clear and moist.  Eyes: EOM are normal. Pupils are equal, round, and reactive to light.  Neck: Normal range of motion. Neck supple.  Cardiovascular: Normal rate and regular rhythm.  Exam reveals no friction rub.   No murmur heard. Pulmonary/Chest: Effort normal. No respiratory distress. She has no wheezes. She has rales (diffuse, moderate).  Abdominal: Soft. She exhibits no distension. There is no tenderness. There is no rebound.  Musculoskeletal: Normal range of motion. She exhibits no edema.  Neurological: She is alert and oriented to person, place, and time. No cranial nerve deficit. She exhibits normal muscle tone. Coordination normal.  Skin: No rash noted. She is not diaphoretic.  Nursing note and vitals reviewed.   ED Course  Procedures (including critical care time) Labs Review Labs Reviewed  CBC  BASIC METABOLIC PANEL  PRO B NATRIURETIC PEPTIDE  TROPONIN I  Randolm Idol, ED    Imaging Review Dg Chest Port 1 View  12/25/2013   CLINICAL DATA:  Marked shortness of breath.  EXAM: PORTABLE CHEST - 1 VIEW  COMPARISON:  05/11/2013.  FINDINGS: Enlarged cardiac silhouette without significant change. Increased prominence of the pulmonary vasculature and interstitial markings. Minimal right pleural effusion. Diffuse osteopenia. Marked bilateral shoulder degenerative changes.  IMPRESSION: Interval changes of acute congestive heart failure with stable cardiomegaly.   Electronically Signed   By: Enrique Sack M.D.   On: 12/25/2013 07:18     EKG Interpretation   Date/Time:   Friday December 25 2013 06:03:34 EST Ventricular Rate:  95 PR Interval:  176 QRS Duration: 174 QT Interval:  444 QTC Calculation: 558 R Axis:   -69 Text Interpretation:  Sinus rhythm Probable left atrial enlargement Left  bundle branch block Probable RV involvement, suggest recording right  precordial leads LBBB old Confirmed by Mingo Amber  MD, [place] [phone]) on  12/25/2013 6:12:52 AM       CRITICAL CARE Performed by: Osvaldo Shipper   Total critical care time: 50 minutes  Critical care time was exclusive of separately billable procedures and treating other patients.  Critical care was necessary to treat or prevent imminent or life-threatening deterioration.  Critical care was time spent personally by me on the following activities: development of treatment plan with patient and/or surrogate as well as nursing, discussions with consultants, evaluation of patient's response to treatment, examination of patient, obtaining history from patient or surrogate, ordering and performing treatments and interventions, ordering and review of laboratory studies, ordering and review of radiographic studies, pulse oximetry and re-evaluation of patient's condition.   MDM   Final diagnoses:  Shortness of breath  Acute heart failure, unspecified heart failure type  Flash pulmonary edema    47F here with acute onset of shortness of breath. Started on CPAP by EMS with great improvement. Here with diffuse rales, BPs ok, improving on BiPAP. EKG with LBBB, similar to prior - reviewed by Dr. Haroldine Laws.  Labs show elevation in BNP, elevated troponin. Heparin initiated. Will trial off BiPap. Dr. Haroldine Laws contacted for Cardiology consult, likely admission by Cards.    Evelina Bucy, MD 12/25/13 [id number]

## 2013-12-25 NOTE — Plan of Care (Signed)
Problem: Phase I Progression Outcomes Goal: Dyspnea controlled at rest (HF) Outcome: Completed/Met Date Met:  12/25/13

## 2013-12-25 NOTE — Progress Notes (Signed)
Admitted from the ED by bed, with nitro gtt in progress. No order noted, verified with MD who gave order to d/c.

## 2013-12-25 NOTE — Progress Notes (Signed)
  Echocardiogram 2D Echocardiogram has been performed.  [name] M 12/25/2013, 5:06 PM

## 2013-12-25 NOTE — ED Notes (Addendum)
Removed pt from bipap per Dr Mingo Amber.

## 2013-12-25 NOTE — Progress Notes (Signed)
Pt has been off bipap and on 2L since 7225 with no complications. Pt states she feels better and denies sob.

## 2013-12-25 NOTE — H&P (Signed)
ADMISSION HISTORY & PHYSICAL   Chief Complaint:  Shortness of breath  Cardiologist: None (New)  Primary Care Physician: No PCP Per Patient  HPI:  This is a 80 y.o. female with a past medical history significant for asthma and upper airway cough syndrome followed by Dr. Halford Chessman. She also has a history of left bundle branch block which is noted in 2013, however did not see that she's had cardiac workup for this in the past. She is quite physically active, and works Education administrator school buses. She was working at 3 AM this morning and became acutely short of breath, pale and diaphoretic. She denied any chest pain. She was treated with oxygen and brought to the emergency department. She was placed on BiPAP for marked dyspnea and diuresis. Laboratory work indicates elevated BNP and mildly elevated troponin. Chest x-ray shows small pleural effusion, signs of pulmonary congestion and cardiomegaly. Currently she is off BiPAP and breathing better and can lay flat.  PMHx:  Past Medical History  Diagnosis Date  . PONV (postoperative nausea and vomiting)   . Arthritis     hands - no meds  . Asthma   . Upper airway cough syndrome     Post-nasal drip    Past Surgical History  Procedure Laterality Date  . Replacement total knee bilateral  2009  . Appendectomy    . Cystocele repair  12/18/2011    Procedure: ANTERIOR REPAIR (CYSTOCELE);  Surgeon: Gus Height, MD;  Location: [facility];  Service: Gynecology;  Laterality: N/A;    FAMHx:  Family History  Problem Relation Age of Onset  . Cerebral aneurysm Mother     died at age 25  . Healthy Father     died of old age    SOCHx:   reports that she has never smoked. She has never used smokeless tobacco. She reports that she does not drink alcohol or use illicit drugs.  ALLERGIES:  No Known Allergies  ROS: A comprehensive review of systems was negative except for: Respiratory: positive for dyspnea on exertion Integument/breast: positive for  diaphroesis  HOME MEDS:  (Not in a hospital admission)  LABS/IMAGING: Results for orders placed or performed during the hospital encounter of 12/25/13 (from the past 48 hour(s))  CBC     Status: Abnormal   Collection Time: 12/25/13  6:09 AM  Result Value Ref Range   WBC 6.5 4.0 - 10.5 K/uL   RBC 4.47 3.87 - 5.11 MIL/uL   Hemoglobin 11.2 (L) 12.0 - 15.0 g/dL   HCT 36.2 36.0 - 46.0 %   MCV 81.0 78.0 - 100.0 fL   MCH 25.1 (L) 26.0 - 34.0 pg   MCHC 30.9 30.0 - 36.0 g/dL   RDW 15.2 11.5 - 15.5 %   Platelets 197 150 - 400 K/uL  Basic metabolic panel     Status: Abnormal   Collection Time: 12/25/13  6:09 AM  Result Value Ref Range   Sodium 146 137 - 147 mEq/L   Potassium 4.2 3.7 - 5.3 mEq/L   Chloride 110 96 - 112 mEq/L   CO2 23 19 - 32 mEq/L   Glucose, Bld 223 (H) 70 - 99 mg/dL   BUN 29 (H) 6 - 23 mg/dL   Creatinine, Ser 0.78 0.50 - 1.10 mg/dL   Calcium 8.7 8.4 - 10.5 mg/dL   GFR calc non Af Amer 74 (L) >90 mL/min   GFR calc Af Amer 85 (L) >90 mL/min    Comment: (NOTE) The eGFR  has been calculated using the CKD EPI equation. This calculation has not been validated in all clinical situations. eGFR's persistently <90 mL/min signify possible Chronic Kidney Disease.    Anion gap 13 5 - 15  Pro b natriuretic peptide (BNP)     Status: Abnormal   Collection Time: 12/25/13  6:09 AM  Result Value Ref Range   Pro B Natriuretic peptide (BNP) 1516.0 (H) 0 - 450 pg/mL  Troponin I     Status: Abnormal   Collection Time: 12/25/13  6:09 AM  Result Value Ref Range   Troponin I 0.66 (HH) <0.30 ng/mL    Comment:        Due to the release kinetics of cTnI, a negative result within the first hours of the onset of symptoms does not rule out myocardial infarction with certainty. If myocardial infarction is still suspected, repeat the test at appropriate intervals. CRITICAL RESULT CALLED TO, READ BACK BY AND VERIFIED WITH: SHULAR L,RN 12/25/13 0655 [place]   I-stat troponin, ED     Status:  Abnormal   Collection Time: 12/25/13  6:21 AM  Result Value Ref Range   Troponin i, poc 0.14 (HH) 0.00 - 0.08 ng/mL   Comment NOTIFIED PHYSICIAN    Comment 3            Comment: Due to the release kinetics of cTnI, a negative result within the first hours of the onset of symptoms does not rule out myocardial infarction with certainty. If myocardial infarction is still suspected, repeat the test at appropriate intervals.    Dg Chest Port 1 View  12/25/2013   CLINICAL DATA:  Marked shortness of breath.  EXAM: PORTABLE CHEST - 1 VIEW  COMPARISON:  05/11/2013.  FINDINGS: Enlarged cardiac silhouette without significant change. Increased prominence of the pulmonary vasculature and interstitial markings. Minimal right pleural effusion. Diffuse osteopenia. Marked bilateral shoulder degenerative changes.  IMPRESSION: Interval changes of acute congestive heart failure with stable cardiomegaly.   Electronically Signed   By: Enrique Sack M.D.   On: 12/25/2013 07:18    VITALS: Filed Vitals:   12/25/13 0830  BP: 148/85  Pulse: 95  Resp: 27    EXAM: General appearance: alert, no distress and pale Neck: JVD - 2 cm above sternal notch and no carotid bruit Lungs: diminished breath sounds bilaterally, rales bibasilar and wheezes bilaterally Heart: regular rate and rhythm, S1, S2 normal and no S3 or S4 Abdomen: soft, non-tender; bowel sounds normal; no masses,  no organomegaly Extremities: extremities normal, atraumatic, no cyanosis or edema Pulses: 2+ and symmetric Skin: pale, warm, dry Neurologic: Grossly normal Psych: Pleasant, normal mood, affect  IMPRESSION: Principal Problem:   Acute pulmonary edema Active Problems:   Elevated troponin   LBBB (left bundle branch block)   PLAN: 1. Acute pulmonary edema which appears to be related to heart failure and possibly acute MI. I agree with heparinization and admission for further workup. I would trend troponins and obtain a 2-D echo today.  Given her left bundle branch block and elevated troponins, she will likely need definitive heart catheterization, possibly on Monday. She is somewhat wheezy and has a asthmatic/bronchitic history is well, and I will add Xopenex nebulizers as needed. Continue diuresis. Admit to stepdown.  Pixie Casino, MD, [facility] Attending Cardiologist [facility]  HILTY,Kenneth C 12/25/2013, 8:48 AM

## 2013-12-26 DIAGNOSIS — I340 Nonrheumatic mitral (valve) insufficiency: Secondary | ICD-10-CM

## 2013-12-26 DIAGNOSIS — I214 Non-ST elevation (NSTEMI) myocardial infarction: Secondary | ICD-10-CM | POA: Diagnosis present

## 2013-12-26 DIAGNOSIS — I252 Old myocardial infarction: Secondary | ICD-10-CM | POA: Diagnosis present

## 2013-12-26 DIAGNOSIS — I255 Ischemic cardiomyopathy: Secondary | ICD-10-CM

## 2013-12-26 HISTORY — DX: Nonrheumatic mitral (valve) insufficiency: I34.0

## 2013-12-26 LAB — BASIC METABOLIC PANEL
Anion gap: 12 (ref 5–15)
BUN: 22 mg/dL (ref 6–23)
CO2: 28 meq/L (ref 19–32)
Calcium: 8.6 mg/dL (ref 8.4–10.5)
Chloride: 102 mEq/L (ref 96–112)
Creatinine, Ser: 0.72 mg/dL (ref 0.50–1.10)
GFR calc Af Amer: 88 mL/min — ABNORMAL LOW (ref >90)
GFR, EST NON AFRICAN AMERICAN: 76 mL/min — AB (ref >90)
GLUCOSE: 95 mg/dL (ref 70–99)
POTASSIUM: 3.7 meq/L (ref 3.7–5.3)
SODIUM: 142 meq/L (ref 137–147)

## 2013-12-26 LAB — CBC
HCT: 34 % — ABNORMAL LOW (ref 36.0–46.0)
HEMOGLOBIN: 10.4 g/dL — AB (ref 12.0–15.0)
MCH: 24.2 pg — ABNORMAL LOW (ref 26.0–34.0)
MCHC: 30.6 g/dL (ref 30.0–36.0)
MCV: 79.3 fL (ref 78.0–100.0)
Platelets: 184 10*3/uL (ref 150–400)
RBC: 4.29 MIL/uL (ref 3.87–5.11)
RDW: 15.1 % (ref 11.5–15.5)
WBC: 5.9 10*3/uL (ref 4.0–10.5)

## 2013-12-26 LAB — HEPARIN LEVEL (UNFRACTIONATED)
HEPARIN UNFRACTIONATED: 0.3 [IU]/mL (ref 0.30–0.70)
Heparin Unfractionated: 0.36 IU/mL (ref 0.30–0.70)

## 2013-12-26 LAB — PRO B NATRIURETIC PEPTIDE: PRO B NATRI PEPTIDE: 3271 pg/mL — AB (ref 0–450)

## 2013-12-26 MED ORDER — ATORVASTATIN CALCIUM 40 MG PO TABS
40.0000 mg | ORAL_TABLET | Freq: Every day | ORAL | Status: DC
  Administered 2013-12-26 – 2014-01-04 (×10): 40 mg via ORAL
  Filled 2013-12-26 (×11): qty 1

## 2013-12-26 MED ORDER — LEVALBUTEROL HCL 0.63 MG/3ML IN NEBU: 0.6300 mg | INHALATION_SOLUTION | Freq: Four times a day (QID) | RESPIRATORY_TRACT | Status: DC | PRN

## 2013-12-26 NOTE — Plan of Care (Signed)
Problem: Phase I Progression Outcomes Goal: Up in chair, BRP Outcome: Completed/Met Date Met:  12/26/13     

## 2013-12-26 NOTE — Plan of Care (Signed)
Problem: Phase I Progression Outcomes Goal: Hemodynamically stable Outcome: Completed/Met Date Met:  12/26/13     

## 2013-12-26 NOTE — Plan of Care (Signed)
Problem: Phase II Progression Outcomes Goal: Tolerating diet Outcome: Completed/Met Date Met:  12/26/13     

## 2013-12-26 NOTE — Plan of Care (Signed)
Problem: Phase I Progression Outcomes Goal: Initial discharge plan identified Outcome: Completed/Met Date Met:  12/26/13     

## 2013-12-26 NOTE — Progress Notes (Signed)
Primary cardiologist: Dr. Lyman Bishop  Seen for followup: NSTEMI, CHF  Subjective:    Patient sitting up in bed, just ate lunch. Reports no chest pain, breathing has improved significantly. No palpitations.  Objective:   Temp:  [97.2 F (36.2 C)-98.3 F (36.8 C)] 98.3 F (36.8 C) (11/28 1224) Pulse Rate:  [69-82] 82 (11/28 0750) Resp:  [17-24] 18 (11/28 1224) BP: (118-151)/(54-105) 144/64 mmHg (11/28 1224) SpO2:  [93 %-100 %] 98 % (11/28 1224) Weight:  [149 lb 14.6 oz (68 kg)-150 lb 12.7 oz (68.4 kg)] 149 lb 14.6 oz (68 kg) (11/28 0400) Last BM Date: 12/24/13  Filed Weights   12/25/13 0700 12/25/13 1411 12/26/13 0400  Weight: 155 lb (70.308 kg) 150 lb 12.7 oz (68.4 kg) 149 lb 14.6 oz (68 kg)    Intake/Output Summary (Last 24 hours) at 12/26/13 1240 Last data filed at 12/26/13 1200  Gross per 24 hour  Intake  520.5 ml  Output   1870 ml  Net -1349.5 ml    Telemetry: Sinus rhythm.  Exam:  General: Elderly woman, appears comfortable.  HEENT: Conjunctiva and lids normal, oropharynx clear.  Lungs: Few crackles at the bases. No wheezing.  Cardiac: RRR, no gallop, soft apical systolic murmur.  Abdomen: NABS.  Extremities: No pitting edema.   Lab Results:  Basic Metabolic Panel:  Recent Labs Lab 12/25/13 0609 12/26/13 0030  NA 146 142  K 4.2 3.7  CL 110 102  CO2 23 28  GLUCOSE 223* 95  BUN 29* 22  CREATININE 0.78 0.72  CALCIUM 8.7 8.6    CBC:  Recent Labs Lab 12/25/13 0609 12/26/13 0030  WBC 6.5 5.9  HGB 11.2* 10.4*  HCT 36.2 34.0*  MCV 81.0 79.3  PLT 197 184    Cardiac Enzymes:  Recent Labs Lab 12/25/13 0609 12/25/13 1517 12/25/13 2004  TROPONINI 0.66* 18.80* 10.60*    BNP:  Recent Labs  12/25/13 0609 12/26/13 0030  PROBNP 1516.0* 3271.0*    Echocardiogram (12/25/2013): Study Conclusions  - Left ventricle: The cavity size was normal. Wall thickness was increased in a pattern of moderate LVH. The estimated  ejection fraction was 15%. Severe global hypokinesis with akinesis of the inferior, basal inferoseptal and apical walls. diastolic dysfunction (probably restrictive) with very high LV filling pressure. - Aortic valve: Poorly visualized. Mildly calcified leaflets. There was no stenosis. There was no regurgitation. - Mitral valve: Calcified annulus. Mildly thickened leaflets . There was moderate regurgitation. - Left atrium: Moderately dilated at 44 ml/m2. - Right ventricle: The cavity size was normal. Wall thickness was normal. Systolic function was normal. Lateral annulus peak S velocity: 13.2 cm/s. - Right atrium: The atrium was normal in size. - Atrial septum: Aneurysmal interatrial septum. PFO cannot be excluded. - Tricuspid valve: There was moderate regurgitation. - Pulmonary arteries: PA peak pressure: 50 mm Hg (S).  Impressions:  - LVEF 15%, severe global hypokinesis with inferior, inferoseptal and apical akinesis, moderate LVH, restrictive diastology wtih very high LV filling pressures, moderate MR and TR, moderately dilated LA, atrial septal aneurysm (cannot exclude PFO), moderate pulmonary hypertension - RVSP 50 mmHg.   Medications:   Scheduled Medications: . aspirin EC  81 mg Oral Daily  . budesonide-formoterol  2 puff Inhalation BID  . carvedilol  3.125 mg Oral BID WC  . furosemide  40 mg Intravenous BID  . sodium chloride  3 mL Intravenous Q12H    Infusions: . heparin 900 Units/hr (12/26/13 0700)    PRN Medications: sodium  chloride, acetaminophen, levalbuterol, ondansetron (ZOFRAN) IV, sodium chloride   Assessment:   1. NSTEMI, peak troponin I 18.8. She is chest pain-free, presented with presumably acute systolic heart failure. Left bundle branch block by ECG is old.  2. Cardiomyopathy, suspected ischemic etiology in light of presentation and also inferior wall motion abnormality. LVEF 15% by echocardiogram.  3. Moderate mitral  regurgitation with moderate pulmonary hypertension.  4. History of chronic bronchitis.  5. Uncertain lipid status.  6. Anemia, chronicity uncertain. Hgb was 12.9 in 2013. Guiac stools.   Plan/Discussion:    Met with patient, granddaughter (nurse), and also son at bedside. Discussed findings since admission and overall situation. Plan is to proceed with a diagnostic heart catheterization on Monday to evaluate coronary anatomy for any potential revascularization options. Continue on aspirin, heparin, Coreg, and Lasix. Renal function is stable. Hold off ACE inhibitor or ARB until after angiography. Need lipid panel, start empiric statin. BMET and CBC in the morning.   Satira Sark, M.D., F.A.C.C.

## 2013-12-26 NOTE — Plan of Care (Signed)
Problem: Phase I Progression Outcomes Goal: EF % per last Echo/documented,Core Reminder form on chart Outcome: Completed/Met Date Met:  12/26/13

## 2013-12-26 NOTE — Progress Notes (Signed)
ANTICOAGULATION CONSULT NOTE - Follow Up Consult  Pharmacy Consult for Heparin  Indication: chest pain/ACS  No Known Allergies  Patient Measurements: Height: 5\' 6"  (167.6 cm) Weight: 150 lb 12.7 oz (68.4 kg) IBW/kg (Calculated) : 59.3  Vital Signs: Temp: 97.2 F (36.2 C) (11/28 0006) Temp Source: Oral (11/28 0006) BP: 118/58 mmHg (11/28 0006) Pulse Rate: 69 (11/28 0006)  Labs:  Recent Labs  12/25/13 0609 12/25/13 1517 12/25/13 2004 12/26/13 0030  HGB 11.2*  --   --  10.4*  HCT 36.2  --   --  34.0*  PLT 197  --   --  184  HEPARINUNFRC  --  0.18*  --  0.30  CREATININE 0.78  --   --  0.72  TROPONINI 0.66* 18.80* 10.60*  --     Estimated Creatinine Clearance: 47.3 mL/min (by C-G formula based on Cr of 0.72).   Assessment: Heparin level at the low end of therapeutic range, Hgb 10.4, other labs as above, with advanced age will not increase drip.   Goal of Therapy:  Heparin level 0.3-0.7 units/ml Monitor platelets by anticoagulation protocol: Yes   Plan:  -Continue heparin at 900 units/hr -0800 HL -Daily CBC/HL -Monitor for bleeding  Narda Bonds 12/26/2013,1:18 AM

## 2013-12-26 NOTE — Plan of Care (Signed)
Problem: Phase I Progression Outcomes Goal: Voiding-avoid urinary catheter unless indicated Outcome: Completed/Met Date Met:  12/26/13     

## 2013-12-26 NOTE — Progress Notes (Signed)
ANTICOAGULATION CONSULT NOTE - Follow Up Consult  Pharmacy Consult for Heparin  Indication: chest pain/ACS  No Known Allergies  Patient Measurements: Height: 5\' 6"  (167.6 cm) Weight: 149 lb 14.6 oz (68 kg) IBW/kg (Calculated) : 59.3  Vital Signs: Temp: 97.6 F (36.4 C) (11/28 0750) Temp Source: Oral (11/28 0750) BP: 138/62 mmHg (11/28 0750) Pulse Rate: 82 (11/28 0750)  Labs:  Recent Labs  12/25/13 0609 12/25/13 1517 12/25/13 2004 12/26/13 0030 12/26/13 0730  HGB 11.2*  --   --  10.4*  --   HCT 36.2  --   --  34.0*  --   PLT 197  --   --  184  --   HEPARINUNFRC  --  0.18*  --  0.30 0.36  CREATININE 0.78  --   --  0.72  --   TROPONINI 0.66* 18.80* 10.60*  --   --     Estimated Creatinine Clearance: 47.3 mL/min (by C-G formula based on Cr of 0.72).   Assessment: Heparin for CP. Heparin is now therapeutic.    Goal of Therapy:  Heparin level 0.3-0.7 units/ml Monitor platelets by anticoagulation protocol: Yes   Plan:   Continue heparin at 900 units/hr Daily CBC/HL Monitor for bleeding  Onnie Boer, PharmD Pager: [phone] 12/26/2013 11:21 AM

## 2013-12-27 LAB — BASIC METABOLIC PANEL
Anion gap: 13 (ref 5–15)
BUN: 22 mg/dL (ref 6–23)
CALCIUM: 8.9 mg/dL (ref 8.4–10.5)
CHLORIDE: 102 meq/L (ref 96–112)
CO2: 27 mEq/L (ref 19–32)
CREATININE: 0.79 mg/dL (ref 0.50–1.10)
GFR calc non Af Amer: 73 mL/min — ABNORMAL LOW (ref >90)
GFR, EST AFRICAN AMERICAN: 85 mL/min — AB (ref >90)
Glucose, Bld: 105 mg/dL — ABNORMAL HIGH (ref 70–99)
Potassium: 3.8 mEq/L (ref 3.7–5.3)
SODIUM: 142 meq/L (ref 137–147)

## 2013-12-27 LAB — HEPATIC FUNCTION PANEL
ALBUMIN: 3.1 g/dL — AB (ref 3.5–5.2)
ALT: 37 U/L — ABNORMAL HIGH (ref 0–35)
AST: 40 U/L — ABNORMAL HIGH (ref 0–37)
Alkaline Phosphatase: 86 U/L (ref 39–117)
Total Bilirubin: 0.6 mg/dL (ref 0.3–1.2)
Total Protein: 5.9 g/dL — ABNORMAL LOW (ref 6.0–8.3)

## 2013-12-27 LAB — CBC
HCT: 35.3 % — ABNORMAL LOW (ref 36.0–46.0)
Hemoglobin: 10.7 g/dL — ABNORMAL LOW (ref 12.0–15.0)
MCH: 24.2 pg — ABNORMAL LOW (ref 26.0–34.0)
MCHC: 30.3 g/dL (ref 30.0–36.0)
MCV: 79.7 fL (ref 78.0–100.0)
Platelets: 206 10*3/uL (ref 150–400)
RBC: 4.43 MIL/uL (ref 3.87–5.11)
RDW: 15.2 % (ref 11.5–15.5)
WBC: 5.5 10*3/uL (ref 4.0–10.5)

## 2013-12-27 LAB — LIPID PANEL
CHOLESTEROL: 158 mg/dL (ref 0–200)
HDL: 59 mg/dL (ref >39)
LDL Cholesterol: 82 mg/dL (ref 0–99)
TRIGLYCERIDES: 87 mg/dL (ref <150)
Total CHOL/HDL Ratio: 2.7 RATIO
VLDL: 17 mg/dL (ref 0–40)

## 2013-12-27 LAB — HEPARIN LEVEL (UNFRACTIONATED)
Heparin Unfractionated: 0.15 IU/mL — ABNORMAL LOW (ref 0.30–0.70)
Heparin Unfractionated: 0.34 IU/mL (ref 0.30–0.70)
Heparin Unfractionated: 0.43 IU/mL (ref 0.30–0.70)

## 2013-12-27 MED ORDER — CARVEDILOL 6.25 MG PO TABS
6.2500 mg | ORAL_TABLET | Freq: Two times a day (BID) | ORAL | Status: DC
  Administered 2013-12-27 – 2014-01-05 (×19): 6.25 mg via ORAL
  Filled 2013-12-27 (×22): qty 1

## 2013-12-27 MED ORDER — HEPARIN (PORCINE) IN NACL 100-0.45 UNIT/ML-% IJ SOLN
1150.0000 [IU]/h | INTRAMUSCULAR | Status: DC
  Administered 2013-12-27 – 2013-12-28 (×2): 1150 [IU]/h via INTRAVENOUS
  Filled 2013-12-27: qty 250

## 2013-12-27 NOTE — Progress Notes (Signed)
ANTICOAGULATION CONSULT NOTE - Follow Up Consult  Pharmacy Consult for Heparin  Indication: chest pain/ACS  No Known Allergies  Patient Measurements: Height: 5\' 6"  (167.6 cm) Weight: 154 lb 5.2 oz (70 kg) IBW/kg (Calculated) : 59.3  Vital Signs: Temp: 97.3 F (36.3 C) (11/29 1900) Temp Source: Oral (11/29 1900) BP: 118/56 mmHg (11/29 1900) Pulse Rate: 75 (11/29 1900)  Labs:  Recent Labs  12/25/13 0609  12/25/13 1517 12/25/13 2004 12/26/13 0030  12/27/13 0215 12/27/13 1303 12/27/13 2003  HGB 11.2*  --   --   --  10.4*  --  10.7*  --   --   HCT 36.2  --   --   --  34.0*  --  35.3*  --   --   PLT 197  --   --   --  184  --  206  --   --   HEPARINUNFRC  --   < > 0.18*  --  0.30  < > 0.15* 0.34 0.43  CREATININE 0.78  --   --   --  0.72  --  0.79  --   --   TROPONINI 0.66*  --  18.80* 10.60*  --   --   --   --   --   < > = values in this interval not displayed.  Estimated Creatinine Clearance: 47.3 mL/min (by C-G formula based on Cr of 0.79).   Assessment: 43 yof continues on IV heparin. Heparin level remains therapeutic. No bleeding or other problems noted.   Goal of Therapy:  Heparin level 0.3-0.7 units/ml Monitor platelets by anticoagulation protocol: Yes   Plan:  1. Continue heparin gtt 1150 units/hr 2. Daily heparin level and CBC  Salome Arnt, PharmD, BCPS Pager # [phone] 12/27/2013 8:42 PM

## 2013-12-27 NOTE — Progress Notes (Signed)
Primary cardiologist: Dr. Lyman Bishop  Seen for followup: NSTEMI, CHF  Subjective:    Up in chair this morning. States that she slept well. No chest pain or shortness of breath. No palpitations or dizziness.  Objective:   Temp:  [97.6 F (36.4 C)-98.3 F (36.8 C)] 97.8 F (36.6 C) (11/29 0300) Pulse Rate:  [74-83] 75 (11/29 0300) Resp:  [17-20] 17 (11/29 0300) BP: (119-146)/(51-88) 146/82 mmHg (11/29 0300) SpO2:  [95 %-98 %] 96 % (11/29 0300) Weight:  [154 lb 5.2 oz (70 kg)] 154 lb 5.2 oz (70 kg) (11/29 0300) Last BM Date: 12/24/13  Filed Weights   12/25/13 1411 12/26/13 0400 12/27/13 0300  Weight: 150 lb 12.7 oz (68.4 kg) 149 lb 14.6 oz (68 kg) 154 lb 5.2 oz (70 kg)    Intake/Output Summary (Last 24 hours) at 12/27/13 0724 Last data filed at 12/27/13 0600  Gross per 24 hour  Intake 694.53 ml  Output   1000 ml  Net -305.47 ml    Telemetry: Sinus rhythm. Rare PVCs and burst of NSVT noted.  Exam:  General: Elderly woman, appears comfortable.  HEENT: Conjunctiva and lids normal, oropharynx clear.  Lungs: Few crackles at the bases. No wheezing.  Cardiac: RRR, no gallop, soft apical systolic murmur.  Abdomen: NABS.  Extremities: No pitting edema.   Lab Results:  Basic Metabolic Panel:  Recent Labs Lab 12/25/13 0609 12/26/13 0030 12/27/13 0215  NA 146 142 142  K 4.2 3.7 3.8  CL 110 102 102  CO2 23 28 27   GLUCOSE 223* 95 105*  BUN 29* 22 22  CREATININE 0.78 0.72 0.79  CALCIUM 8.7 8.6 8.9    CBC:  Recent Labs Lab 12/25/13 0609 12/26/13 0030 12/27/13 0215  WBC 6.5 5.9 5.5  HGB 11.2* 10.4* 10.7*  HCT 36.2 34.0* 35.3*  MCV 81.0 79.3 79.7  PLT 197 184 206    Cardiac Enzymes:  Recent Labs Lab 12/25/13 0609 12/25/13 1517 12/25/13 2004  TROPONINI 0.66* 18.80* 10.60*    BNP:  Recent Labs  12/25/13 0609 12/26/13 0030  PROBNP 1516.0* 3271.0*    Echocardiogram (12/25/2013): Study Conclusions  - Left ventricle: The cavity  size was normal. Wall thickness was increased in a pattern of moderate LVH. The estimated ejection fraction was 15%. Severe global hypokinesis with akinesis of the inferior, basal inferoseptal and apical walls. diastolic dysfunction (probably restrictive) with very high LV filling pressure. - Aortic valve: Poorly visualized. Mildly calcified leaflets. There was no stenosis. There was no regurgitation. - Mitral valve: Calcified annulus. Mildly thickened leaflets . There was moderate regurgitation. - Left atrium: Moderately dilated at 44 ml/m2. - Right ventricle: The cavity size was normal. Wall thickness was normal. Systolic function was normal. Lateral annulus peak S velocity: 13.2 cm/s. - Right atrium: The atrium was normal in size. - Atrial septum: Aneurysmal interatrial septum. PFO cannot be excluded. - Tricuspid valve: There was moderate regurgitation. - Pulmonary arteries: PA peak pressure: 50 mm Hg (S).  Impressions:  - LVEF 15%, severe global hypokinesis with inferior, inferoseptal and apical akinesis, moderate LVH, restrictive diastology wtih very high LV filling pressures, moderate MR and TR, moderately dilated LA, atrial septal aneurysm (cannot exclude PFO), moderate pulmonary hypertension - RVSP 50 mmHg.   Medications:   Scheduled Medications: . aspirin EC  81 mg Oral Daily  . atorvastatin  40 mg Oral q1800  . budesonide-formoterol  2 puff Inhalation BID  . carvedilol  3.125 mg Oral BID WC  .  furosemide  40 mg Intravenous BID  . sodium chloride  3 mL Intravenous Q12H    Infusions: . heparin 1,050 Units/hr (12/27/13 0600)    PRN Medications: sodium chloride, acetaminophen, levalbuterol, ondansetron (ZOFRAN) IV, sodium chloride   Assessment:   1. NSTEMI, peak troponin I 18.8. She is chest pain-free and breathing comfortably, presented with presumably acute systolic heart failure. Left bundle branch block by ECG is old.  2.  Cardiomyopathy, suspected ischemic etiology in light of presentation and also inferior wall motion abnormality. LVEF 15% by echocardiogram.  3. Moderate mitral regurgitation with moderate pulmonary hypertension.  4. History of chronic bronchitis. On Symbicort.  5. LDL 82. AST 40 and ALT 37 (likely secondary to ACS). Continue Lipitor.  6. Anemia, chronicity uncertain. Hgb was 12.9 in 2013. Guiac stools. Hgb stable at 10.7.   Plan/Discussion:    Discussed with patient and daughter this morning. Plan is to proceed with diagnostic cardiac catheterization tomorrow for assessment of coronary anatomy and evaluation of potential percutaneous revascularization options. She is in agreement to proceed. Increase Coreg to 6.25 mg twice daily, continue Heparin, Lipitor, Lasix. No ACE-I or ARB until after angiography.  Satira Sark, M.D., F.A.C.C.

## 2013-12-27 NOTE — Progress Notes (Addendum)
ANTICOAGULATION CONSULT NOTE - Follow Up Consult  Pharmacy Consult for Heparin  Indication: chest pain/ACS  No Known Allergies  Patient Measurements: Height: 5\' 6"  (167.6 cm) Weight: 154 lb 5.2 oz (70 kg) IBW/kg (Calculated) : 59.3  Vital Signs: Temp: 97.8 F (36.6 C) (11/29 0300) Temp Source: Oral (11/29 0300) BP: 146/82 mmHg (11/29 0300) Pulse Rate: 75 (11/29 0300)  Labs:  Recent Labs  12/25/13 0609  12/25/13 1517 12/25/13 2004 12/26/13 0030 12/26/13 0730 12/27/13 0215  HGB 11.2*  --   --   --  10.4*  --  10.7*  HCT 36.2  --   --   --  34.0*  --  35.3*  PLT 197  --   --   --  184  --  206  HEPARINUNFRC  --   < > 0.18*  --  0.30 0.36 0.15*  CREATININE 0.78  --   --   --  0.72  --  0.79  TROPONINI 0.66*  --  18.80* 10.60*  --   --   --   < > = values in this interval not displayed.  Estimated Creatinine Clearance: 47.3 mL/min (by C-G formula based on Cr of 0.79).   Assessment: Sub-therapeutic heparin level, other labs as above, no issues per RN.   Goal of Therapy:  Heparin level 0.3-0.7 units/ml Monitor platelets by anticoagulation protocol: Yes   Plan:  -Increase heparin to 1050 units/hr -1230 HL -Daily CBC/HL -Monitor for bleeding  Narda Bonds 12/27/2013,4:19 AM  Addendum  Heparin level came back therapeutic this PM. Will adjust rate slightly.   Increase heparin to 1150 units/hr  Check confirm level  Onnie Boer, PharmD Pager: [phone] 12/27/2013 2:06 PM

## 2013-12-28 ENCOUNTER — Encounter (HOSPITAL_COMMUNITY)
Admission: EM | Disposition: A | Discharge status: Home / Self Care | Payer: Self-pay | Source: Home / Self Care | Attending: Internal Medicine

## 2013-12-28 DIAGNOSIS — I5021 Acute systolic (congestive) heart failure: Secondary | ICD-10-CM | POA: Diagnosis present

## 2013-12-28 DIAGNOSIS — I2510 Atherosclerotic heart disease of native coronary artery without angina pectoris: Secondary | ICD-10-CM

## 2013-12-28 DIAGNOSIS — I5020 Unspecified systolic (congestive) heart failure: Secondary | ICD-10-CM | POA: Diagnosis present

## 2013-12-28 HISTORY — PX: CARDIAC CATHETERIZATION: SHX172

## 2013-12-28 LAB — CBC
HEMATOCRIT: 37.2 % (ref 36.0–46.0)
Hemoglobin: 11.4 g/dL — ABNORMAL LOW (ref 12.0–15.0)
MCH: 24.4 pg — ABNORMAL LOW (ref 26.0–34.0)
MCHC: 30.6 g/dL (ref 30.0–36.0)
MCV: 79.5 fL (ref 78.0–100.0)
Platelets: 189 10*3/uL (ref 150–400)
RBC: 4.68 MIL/uL (ref 3.87–5.11)
RDW: 15.1 % (ref 11.5–15.5)
WBC: 5.1 10*3/uL (ref 4.0–10.5)

## 2013-12-28 LAB — POCT I-STAT 3, VENOUS BLOOD GAS (G3P V)
Acid-Base Excess: 3 mmol/L — ABNORMAL HIGH (ref 0.0–2.0)
Bicarbonate: 29.2 mEq/L — ABNORMAL HIGH (ref 20.0–24.0)
O2 Saturation: 66 %
TCO2: 31 mmol/L (ref 0–100)
pCO2, Ven: 50.7 mmHg — ABNORMAL HIGH (ref 45.0–50.0)
pH, Ven: 7.368 — ABNORMAL HIGH (ref 7.250–7.300)
pO2, Ven: 36 mmHg (ref 30.0–45.0)

## 2013-12-28 LAB — POCT I-STAT 3, ART BLOOD GAS (G3+)
ACID-BASE EXCESS: 2 mmol/L (ref 0.0–2.0)
Bicarbonate: 27.5 mEq/L — ABNORMAL HIGH (ref 20.0–24.0)
O2 SAT: 98 %
PCO2 ART: 47.1 mmHg — AB (ref 35.0–45.0)
TCO2: 29 mmol/L (ref 0–100)
pH, Arterial: 7.375 (ref 7.350–7.450)
pO2, Arterial: 119 mmHg — ABNORMAL HIGH (ref 80.0–100.0)

## 2013-12-28 LAB — BASIC METABOLIC PANEL
Anion gap: 14 (ref 5–15)
BUN: 19 mg/dL (ref 6–23)
CHLORIDE: 102 meq/L (ref 96–112)
CO2: 28 meq/L (ref 19–32)
CREATININE: 0.75 mg/dL (ref 0.50–1.10)
Calcium: 9 mg/dL (ref 8.4–10.5)
GFR calc Af Amer: 86 mL/min — ABNORMAL LOW (ref >90)
GFR calc non Af Amer: 75 mL/min — ABNORMAL LOW (ref >90)
Glucose, Bld: 106 mg/dL — ABNORMAL HIGH (ref 70–99)
Potassium: 4 mEq/L (ref 3.7–5.3)
Sodium: 144 mEq/L (ref 137–147)

## 2013-12-28 LAB — PROTIME-INR
INR: 1.05 (ref 0.00–1.49)
PROTHROMBIN TIME: 13.9 s (ref 11.6–15.2)

## 2013-12-28 LAB — HEPARIN LEVEL (UNFRACTIONATED): HEPARIN UNFRACTIONATED: 0.41 [IU]/mL (ref 0.30–0.70)

## 2013-12-28 LAB — POCT ACTIVATED CLOTTING TIME: Activated Clotting Time: 104 seconds

## 2013-12-28 SURGERY — RIGHT/LEFT HEART CATH AND CORONARY ANGIOGRAPHY

## 2013-12-28 MED ORDER — LIDOCAINE HCL (PF) 1 % IJ SOLN
INTRAMUSCULAR | Status: AC
  Filled 2013-12-28: qty 30

## 2013-12-28 MED ORDER — ASPIRIN EC 81 MG PO TBEC
81.0000 mg | DELAYED_RELEASE_TABLET | Freq: Every day | ORAL | Status: DC
  Administered 2013-12-29: 81 mg via ORAL
  Filled 2013-12-28 (×2): qty 1

## 2013-12-28 MED ORDER — ACETAMINOPHEN 325 MG PO TABS
650.0000 mg | ORAL_TABLET | ORAL | Status: DC | PRN
  Administered 2013-12-31 – 2014-01-01 (×2): 650 mg via ORAL
  Filled 2013-12-28 (×2): qty 2

## 2013-12-28 MED ORDER — SODIUM CHLORIDE 0.9 % IJ SOLN: 3.0000 mL | Freq: Two times a day (BID) | INTRAMUSCULAR | Status: DC

## 2013-12-28 MED ORDER — ASPIRIN 81 MG PO CHEW
81.0000 mg | CHEWABLE_TABLET | ORAL | Status: AC
  Administered 2013-12-28: 81 mg via ORAL
  Filled 2013-12-28: qty 1

## 2013-12-28 MED ORDER — NITROGLYCERIN IN D5W 200-5 MCG/ML-% IV SOLN
0.0000 ug/min | INTRAVENOUS | Status: DC
  Administered 2013-12-28: 10 ug/min via INTRAVENOUS

## 2013-12-28 MED ORDER — SODIUM CHLORIDE 0.9 % IV SOLN: 250.0000 mL | INTRAVENOUS | Status: DC | PRN

## 2013-12-28 MED ORDER — NITROGLYCERIN 1 MG/10 ML FOR IR/CATH LAB
INTRA_ARTERIAL | Status: AC
  Filled 2013-12-28: qty 10

## 2013-12-28 MED ORDER — FUROSEMIDE 40 MG PO TABS
40.0000 mg | ORAL_TABLET | Freq: Every day | ORAL | Status: DC
  Administered 2013-12-29 – 2014-01-05 (×8): 40 mg via ORAL
  Filled 2013-12-28 (×8): qty 1

## 2013-12-28 MED ORDER — HEPARIN (PORCINE) IN NACL 100-0.45 UNIT/ML-% IJ SOLN
1100.0000 [IU]/h | INTRAMUSCULAR | Status: DC
  Administered 2013-12-28 – 2013-12-29 (×2): 1100 [IU]/h via INTRAVENOUS
  Filled 2013-12-28 (×2): qty 250

## 2013-12-28 MED ORDER — HEPARIN (PORCINE) IN NACL 2-0.9 UNIT/ML-% IJ SOLN
INTRAMUSCULAR | Status: AC
  Filled 2013-12-28: qty 1000

## 2013-12-28 MED ORDER — SODIUM CHLORIDE 0.9 % IV SOLN
INTRAVENOUS | Status: DC
  Administered 2013-12-28: 1000 mL via INTRAVENOUS

## 2013-12-28 MED ORDER — NITROGLYCERIN IN D5W 200-5 MCG/ML-% IV SOLN
INTRAVENOUS | Status: AC
  Filled 2013-12-28: qty 250

## 2013-12-28 MED ORDER — SODIUM CHLORIDE 0.9 % IJ SOLN: 3.0000 mL | INTRAMUSCULAR | Status: DC | PRN

## 2013-12-28 MED ORDER — SODIUM CHLORIDE 0.9 % IV SOLN: 1.0000 mL/kg/h | INTRAVENOUS | Status: DC

## 2013-12-28 MED ORDER — FENTANYL CITRATE 0.05 MG/ML IJ SOLN
INTRAMUSCULAR | Status: AC
  Filled 2013-12-28: qty 2

## 2013-12-28 MED ORDER — ONDANSETRON HCL 4 MG/2ML IJ SOLN
4.0000 mg | Freq: Four times a day (QID) | INTRAMUSCULAR | Status: DC | PRN
  Administered 2014-01-04: 4 mg via INTRAVENOUS
  Filled 2013-12-28: qty 2

## 2013-12-28 MED ORDER — ASPIRIN 81 MG PO CHEW: 81.0000 mg | CHEWABLE_TABLET | ORAL | Status: DC

## 2013-12-28 MED ORDER — MIDAZOLAM HCL 2 MG/2ML IJ SOLN
INTRAMUSCULAR | Status: AC
  Filled 2013-12-28: qty 2

## 2013-12-28 MED ORDER — SODIUM CHLORIDE 0.9 % IV SOLN
INTRAVENOUS | Status: DC
  Administered 2013-12-28: 16:00:00 via INTRAVENOUS

## 2013-12-28 MED ORDER — LISINOPRIL 5 MG PO TABS
5.0000 mg | ORAL_TABLET | Freq: Every day | ORAL | Status: DC
  Administered 2013-12-28 – 2013-12-30 (×3): 5 mg via ORAL
  Filled 2013-12-28 (×4): qty 1

## 2013-12-28 NOTE — Interval H&P Note (Signed)
History and Physical Interval Note:  12/28/2013 2:22 PM  Beth Johnston  has presented today for surgery, with the diagnosis of cp  The various methods of treatment have been discussed with the patient and family. After consideration of risks, benefits and other options for treatment, the patient has consented to  Procedure(s):  RIGHT ANDCath Lab Visit (complete for each Cath Lab visit)  Clinical Evaluation Leading to the Procedure:   ACS: Yes.    Non-ACS:    Anginal Classification: CCS IV  Anti-ischemic medical therapy: Maximal Therapy (2 or more classes of medications)  Non-Invasive Test Results: No non-invasive testing performed  Prior CABG: No previous CABG       LEFT HEART CATHETERIZATION WITH CORONARY ANGIOGRAM (N/A) as a surgical intervention .  The patient's history has been reviewed, patient examined, no change in status, stable for surgery.  I have reviewed the patient's chart and labs.  Questions were answered to the patient's satisfaction.     KELLY,THOMAS A

## 2013-12-28 NOTE — Progress Notes (Signed)
ANTICOAGULATION CONSULT NOTE - Follow Up Consult  Pharmacy Consult for Heparin  Indication: chest pain/ACS  No Known Allergies  Patient Measurements: Height: 5\' 6"  (167.6 cm) Weight: 152 lb 6.4 oz (69.128 kg) IBW/kg (Calculated) : 59.3  Vital Signs: Temp: 98.2 F (36.8 C) (11/30 1924) Temp Source: Oral (11/30 1924) BP: 118/67 mmHg (11/30 1900) Pulse Rate: 70 (11/30 1808)  Labs:  Recent Labs  12/26/13 0030  12/27/13 0215 12/27/13 1303 12/27/13 2003 12/28/13 0255  HGB 10.4*  --  10.7*  --   --  11.4*  HCT 34.0*  --  35.3*  --   --  37.2  PLT 184  --  206  --   --  189  LABPROT  --   --   --   --   --  13.9  INR  --   --   --   --   --  1.05  HEPARINUNFRC 0.30  < > 0.15* 0.34 0.43 0.41  CREATININE 0.72  --  0.79  --   --  0.75  < > = values in this interval not displayed.  Estimated Creatinine Clearance: 47.3 mL/min (by C-G formula based on Cr of 0.75).   Assessment: 50 yof s/p cath with 2V CAD - possible PCI 12/2.  Restart heparin drip 1100 uts/hr 8hr after sheath pulled.  Sheath ouot 1600 restart at MN . H/H is slightly low but improved, plt wnl and stable. No bleeding or other problems noted.   Goal of Therapy:  Heparin level 0.3-0.7 units/ml Monitor platelets by anticoagulation protocol: Yes   Plan:  Restart  heparin gtt 1100 units/hr at MN - Daily heparin level and CBC   Bonnita Nasuti Pharm.D. CPP, BCPS Clinical Pharmacist [phone] 12/28/2013 10:29 PM

## 2013-12-28 NOTE — Progress Notes (Signed)
ANTICOAGULATION CONSULT NOTE - Follow Up Consult  Pharmacy Consult for Heparin  Indication: chest pain/ACS  No Known Allergies  Patient Measurements: Height: 5\' 6"  (167.6 cm) Weight: 152 lb 6.4 oz (69.128 kg) IBW/kg (Calculated) : 59.3  Vital Signs: Temp: 97.9 F (36.6 C) (11/30 0300) Temp Source: Oral (11/30 0300) BP: 132/62 mmHg (11/30 0300) Pulse Rate: 70 (11/30 0300)  Labs:  Recent Labs  12/25/13 1517 12/25/13 2004  12/26/13 0030  12/27/13 0215 12/27/13 1303 12/27/13 2003 12/28/13 0255  HGB  --   --   < > 10.4*  --  10.7*  --   --  11.4*  HCT  --   --   --  34.0*  --  35.3*  --   --  37.2  PLT  --   --   --  184  --  206  --   --  189  HEPARINUNFRC 0.18*  --   --  0.30  < > 0.15* 0.34 0.43 0.41  CREATININE  --   --   --  0.72  --  0.79  --   --  0.75  TROPONINI 18.80* 10.60*  --   --   --   --   --   --   --   < > = values in this interval not displayed.  Estimated Creatinine Clearance: 47.3 mL/min (by C-G formula based on Cr of 0.75).   Assessment: 74 yof continues on IV heparin for ACS. Heparin level remains therapeutic this am at 0.41. H/H is slightly low but improved, plt wnl and stable. No bleeding or other problems noted.   Goal of Therapy:  Heparin level 0.3-0.7 units/ml Monitor platelets by anticoagulation protocol: Yes   Plan:  - Continue heparin gtt 1150 units/hr - Daily heparin level and CBC - F/u plans after cath today   Harolyn Rutherford, PharmD Clinical Pharmacist - Resident Pager: [phone] Pharmacy: [phone] 12/28/2013 7:56 AM

## 2013-12-28 NOTE — H&P (View-Only) (Signed)
TELEMETRY: Reviewed telemetry pt in NSR: Filed Vitals:   12/27/13 1653 12/27/13 1900 12/27/13 2300 12/28/13 0300  BP: 143/84 118/56 143/59 132/62  Pulse:  75 64 70  Temp: 98.2 F (36.8 C) 97.3 F (36.3 C) 98 F (36.7 C) 97.9 F (36.6 C)  TempSrc: Oral Oral Oral Oral  Resp: 16 18 20 18   Height:      Weight:    152 lb 6.4 oz (69.128 kg)  SpO2: 96% 98% 96% 97%    Intake/Output Summary (Last 24 hours) at 12/28/13 0801 Last data filed at 12/28/13 0700  Gross per 24 hour  Intake 618.26 ml  Output   2400 ml  Net -1781.74 ml   Filed Weights   12/26/13 0400 12/27/13 0300 12/28/13 0300  Weight: 149 lb 14.6 oz (68 kg) 154 lb 5.2 oz (70 kg) 152 lb 6.4 oz (69.128 kg)    Subjective Patient denies any chest pain or SOB. Up ambulating.   Marland Kitchen aspirin  81 mg Oral Pre-Cath  . aspirin EC  81 mg Oral Daily  . atorvastatin  40 mg Oral q1800  . budesonide-formoterol  2 puff Inhalation BID  . carvedilol  6.25 mg Oral BID WC  . furosemide  40 mg Oral Daily  . lisinopril  5 mg Oral Daily  . sodium chloride  3 mL Intravenous Q12H  . sodium chloride  3 mL Intravenous Q12H   . [START ON 12/29/2013] sodium chloride 1,000 mL (12/28/13 0724)  . heparin 1,150 Units/hr (12/28/13 0700)    LABS: Basic Metabolic Panel:  Recent Labs  12/27/13 0215 12/28/13 0255  NA 142 144  K 3.8 4.0  CL 102 102  CO2 27 28  GLUCOSE 105* 106*  BUN 22 19  CREATININE 0.79 0.75  CALCIUM 8.9 9.0   Liver Function Tests:  Recent Labs  12/27/13 0215  AST 40*  ALT 37*  ALKPHOS 86  BILITOT 0.6  PROT 5.9*  ALBUMIN 3.1*   No results for input(s): LIPASE, AMYLASE in the last 72 hours. CBC:  Recent Labs  12/27/13 0215 12/28/13 0255  WBC 5.5 5.1  HGB 10.7* 11.4*  HCT 35.3* 37.2  MCV 79.7 79.5  PLT 206 189   Cardiac Enzymes:  Recent Labs  12/25/13 1517 12/25/13 2004  TROPONINI 18.80* 10.60*   BNP:  Recent Labs  12/26/13 0030  PROBNP 3271.0*   D-Dimer: No results for input(s): DDIMER  in the last 72 hours. Hemoglobin A1C: No results for input(s): HGBA1C in the last 72 hours. Fasting Lipid Panel:  Recent Labs  12/27/13 0215  CHOL 158  HDL 59  LDLCALC 82  TRIG 87  CHOLHDL 2.7   Thyroid Function Tests: No results for input(s): TSH, T4TOTAL, T3FREE, THYROIDAB in the last 72 hours.  Invalid input(s): FREET3   Radiology/Studies:  PORTABLE CHEST - 1 VIEW  COMPARISON: 05/11/2013.  FINDINGS: Enlarged cardiac silhouette without significant change. Increased prominence of the pulmonary vasculature and interstitial markings. Minimal right pleural effusion. Diffuse osteopenia. Marked bilateral shoulder degenerative changes.  IMPRESSION: Interval changes of acute congestive heart failure with stable cardiomegaly.   Electronically Signed By: Enrique Sack M.D. On: 12/25/2013 07:18  Ecg: NSR with LBBB  Echo:Study Conclusions  - Left ventricle: The cavity size was normal. Wall thickness was increased in a pattern of moderate LVH. The estimated ejection fraction was 15%. Severe global hypokinesis with akinesis of the inferior, basal inferoseptal and apical walls. diastolic dysfunction (probably restrictive) with very high LV filling pressure. - Aortic  valve: Poorly visualized. Mildly calcified leaflets. There was no stenosis. There was no regurgitation. - Mitral valve: Calcified annulus. Mildly thickened leaflets . There was moderate regurgitation. - Left atrium: Moderately dilated at 44 ml/m2. - Right ventricle: The cavity size was normal. Wall thickness was normal. Systolic function was normal. Lateral annulus peak S velocity: 13.2 cm/s. - Right atrium: The atrium was normal in size. - Atrial septum: Aneurysmal interatrial septum. PFO cannot be excluded. - Tricuspid valve: There was moderate regurgitation. - Pulmonary arteries: PA peak pressure: 50 mm Hg (S).  Impressions:  - LVEF 15%, severe global hypokinesis with  inferior, inferoseptal and apical akinesis, moderate LVH, restrictive diastology wtih very high LV filling pressures, moderate MR and TR, moderately dilated LA, atrial septal aneurysm (cannot exclude PFO), moderate pulmonary hypertension - RVSP 50 mmHg.  PHYSICAL EXAM General: Well developed, elderly, in no acute distress. Head: Normocephalic, atraumatic, sclera non-icteric, oropharynx is clear Neck: Negative for carotid bruits. JVD not elevated. No adenopathy Lungs: Clear bilaterally to auscultation without wheezes, rales, or rhonchi. Breathing is unlabored. Heart: RRR S1 S2 without gallop. Grade 2/6 systolic murmur at the apex. Abdomen: Soft, non-tender, non-distended with normoactive bowel sounds. No hepatomegaly. No rebound/guarding. No obvious abdominal masses. Msk:  Strength and tone appears normal for age. Extremities: No clubbing, cyanosis or edema.  Distal pedal pulses are 2+ and equal bilaterally. Neuro: Alert and oriented X 3. Moves all extremities spontaneously. Psych:  Responds to questions appropriately with a normal affect.  ASSESSMENT AND PLAN: 1. NSTEMI. Underlying LBBB. Pain free on IV heparin. On beta blocker and ASA. Plan LHC and possible PCI today. The procedure and risks were reviewed including but not limited to death, myocardial infarction, stroke, arrythmias, bleeding, transfusion, emergency surgery, dye allergy, or renal dysfunction. The patient voices understanding and is agreeable to proceed. 2. Acute systolic CHF. EF 15% by Echo. Diuresed well on IV lasix. I/O negative 4300 cc since admit and weight down 3 lbs. Will switch IV lasix to po today. Continue coreg. Add lisinopril 5 mg daily. Consider aldactone prior to DC.  3. Moderate MR. 4. Moderate pulmonary HTN 5. Anemia. Hbg improved to 11.4.   Present on Admission:  . Acute pulmonary edema . Elevated troponin . LBBB (left bundle branch block) . Asthma, persistent controlled  Signed, Sihaam Chrobak [place],  [place] 12/28/2013 8:01 AM

## 2013-12-28 NOTE — Progress Notes (Signed)
Site area: rt groin Site Prior to Removal:  Level 0 Pressure Applied For: 20 minutes Manual:   yes Patient Status During Pull:  stable Post Pull Site:  Level 0 Post Pull Instructions Given:  yes Post Pull Pulses Present: yes Dressing Applied:  tegaderm Bedrest begins @ 1600 Comments: no complications

## 2013-12-28 NOTE — Progress Notes (Signed)
TELEMETRY: Reviewed telemetry pt in NSR: Filed Vitals:   12/27/13 1653 12/27/13 1900 12/27/13 2300 12/28/13 0300  BP: 143/84 118/56 143/59 132/62  Pulse:  75 64 70  Temp: 98.2 F (36.8 C) 97.3 F (36.3 C) 98 F (36.7 C) 97.9 F (36.6 C)  TempSrc: Oral Oral Oral Oral  Resp: 16 18 20 18   Height:      Weight:    152 lb 6.4 oz (69.128 kg)  SpO2: 96% 98% 96% 97%    Intake/Output Summary (Last 24 hours) at 12/28/13 0801 Last data filed at 12/28/13 0700  Gross per 24 hour  Intake 618.26 ml  Output   2400 ml  Net -1781.74 ml   Filed Weights   12/26/13 0400 12/27/13 0300 12/28/13 0300  Weight: 149 lb 14.6 oz (68 kg) 154 lb 5.2 oz (70 kg) 152 lb 6.4 oz (69.128 kg)    Subjective Patient denies any chest pain or SOB. Up ambulating.   Marland Kitchen aspirin  81 mg Oral Pre-Cath  . aspirin EC  81 mg Oral Daily  . atorvastatin  40 mg Oral q1800  . budesonide-formoterol  2 puff Inhalation BID  . carvedilol  6.25 mg Oral BID WC  . furosemide  40 mg Oral Daily  . lisinopril  5 mg Oral Daily  . sodium chloride  3 mL Intravenous Q12H  . sodium chloride  3 mL Intravenous Q12H   . [START ON 12/29/2013] sodium chloride 1,000 mL (12/28/13 0724)  . heparin 1,150 Units/hr (12/28/13 0700)    LABS: Basic Metabolic Panel:  Recent Labs  12/27/13 0215 12/28/13 0255  NA 142 144  K 3.8 4.0  CL 102 102  CO2 27 28  GLUCOSE 105* 106*  BUN 22 19  CREATININE 0.79 0.75  CALCIUM 8.9 9.0   Liver Function Tests:  Recent Labs  12/27/13 0215  AST 40*  ALT 37*  ALKPHOS 86  BILITOT 0.6  PROT 5.9*  ALBUMIN 3.1*   No results for input(s): LIPASE, AMYLASE in the last 72 hours. CBC:  Recent Labs  12/27/13 0215 12/28/13 0255  WBC 5.5 5.1  HGB 10.7* 11.4*  HCT 35.3* 37.2  MCV 79.7 79.5  PLT 206 189   Cardiac Enzymes:  Recent Labs  12/25/13 1517 12/25/13 2004  TROPONINI 18.80* 10.60*   BNP:  Recent Labs  12/26/13 0030  PROBNP 3271.0*   D-Dimer: No results for input(s): DDIMER  in the last 72 hours. Hemoglobin A1C: No results for input(s): HGBA1C in the last 72 hours. Fasting Lipid Panel:  Recent Labs  12/27/13 0215  CHOL 158  HDL 59  LDLCALC 82  TRIG 87  CHOLHDL 2.7   Thyroid Function Tests: No results for input(s): TSH, T4TOTAL, T3FREE, THYROIDAB in the last 72 hours.  Invalid input(s): FREET3   Radiology/Studies:  PORTABLE CHEST - 1 VIEW  COMPARISON: 05/11/2013.  FINDINGS: Enlarged cardiac silhouette without significant change. Increased prominence of the pulmonary vasculature and interstitial markings. Minimal right pleural effusion. Diffuse osteopenia. Marked bilateral shoulder degenerative changes.  IMPRESSION: Interval changes of acute congestive heart failure with stable cardiomegaly.   Electronically Signed By: Enrique Sack M.D. On: 12/25/2013 07:18  Ecg: NSR with LBBB  Echo:Study Conclusions  - Left ventricle: The cavity size was normal. Wall thickness was increased in a pattern of moderate LVH. The estimated ejection fraction was 15%. Severe global hypokinesis with akinesis of the inferior, basal inferoseptal and apical walls. diastolic dysfunction (probably restrictive) with very high LV filling pressure. - Aortic  valve: Poorly visualized. Mildly calcified leaflets. There was no stenosis. There was no regurgitation. - Mitral valve: Calcified annulus. Mildly thickened leaflets . There was moderate regurgitation. - Left atrium: Moderately dilated at 44 ml/m2. - Right ventricle: The cavity size was normal. Wall thickness was normal. Systolic function was normal. Lateral annulus peak S velocity: 13.2 cm/s. - Right atrium: The atrium was normal in size. - Atrial septum: Aneurysmal interatrial septum. PFO cannot be excluded. - Tricuspid valve: There was moderate regurgitation. - Pulmonary arteries: PA peak pressure: 50 mm Hg (S).  Impressions:  - LVEF 15%, severe global hypokinesis with  inferior, inferoseptal and apical akinesis, moderate LVH, restrictive diastology wtih very high LV filling pressures, moderate MR and TR, moderately dilated LA, atrial septal aneurysm (cannot exclude PFO), moderate pulmonary hypertension - RVSP 50 mmHg.  PHYSICAL EXAM General: Well developed, elderly, in no acute distress. Head: Normocephalic, atraumatic, sclera non-icteric, oropharynx is clear Neck: Negative for carotid bruits. JVD not elevated. No adenopathy Lungs: Clear bilaterally to auscultation without wheezes, rales, or rhonchi. Breathing is unlabored. Heart: RRR S1 S2 without gallop. Grade 2/6 systolic murmur at the apex. Abdomen: Soft, non-tender, non-distended with normoactive bowel sounds. No hepatomegaly. No rebound/guarding. No obvious abdominal masses. Msk:  Strength and tone appears normal for age. Extremities: No clubbing, cyanosis or edema.  Distal pedal pulses are 2+ and equal bilaterally. Neuro: Alert and oriented X 3. Moves all extremities spontaneously. Psych:  Responds to questions appropriately with a normal affect.  ASSESSMENT AND PLAN: 1. NSTEMI. Underlying LBBB. Pain free on IV heparin. On beta blocker and ASA. Plan LHC and possible PCI today. The procedure and risks were reviewed including but not limited to death, myocardial infarction, stroke, arrythmias, bleeding, transfusion, emergency surgery, dye allergy, or renal dysfunction. The patient voices understanding and is agreeable to proceed. 2. Acute systolic CHF. EF 15% by Echo. Diuresed well on IV lasix. I/O negative 4300 cc since admit and weight down 3 lbs. Will switch IV lasix to po today. Continue coreg. Add lisinopril 5 mg daily. Consider aldactone prior to DC.  3. Moderate MR. 4. Moderate pulmonary HTN 5. Anemia. Hbg improved to 11.4.   Present on Admission:  . Acute pulmonary edema . Elevated troponin . LBBB (left bundle branch block) . Asthma, persistent controlled  Signed, Karma Ansley [place],  [place] 12/28/2013 8:01 AM

## 2013-12-28 NOTE — CV Procedure (Addendum)
Beth Johnston is a 80 y.o. female   [id number]  [id number] LOCATION:  FACILITY: [place]  PHYSICIAN: Troy Sine, MD, [facility] 1925/03/21   DATE OF PROCEDURE:  12/28/2013      RIGHT AND LEFT HEART CARDIAC CATHETERIZATION   HISTORY:  Beth Johnston is a 80 y.o. female who was admitted on 12/25/2013 with acute systolic heart failure and elevated troponin at 18.  ECG showed left bundle branch block.  Previously, the patient denied any significant history of any shortness of breath, although had a history of asthma.  She ruled in for non-ST segment elevation MI.  An echo Doppler study revealed an ejection fraction of 15% with severe global hypokinesis and akinesis of the inferior, basal, inferoseptal, and apical walls.  She had restrictive physiology and moderate mitral regurgitation.  There was moderate pulmonary hypertension with a PA estimated pressure at 50 mm.  She is now referred for cardiac catheterization.  With her history of an shortness of breath, marked LV dysfunction, and echo cardiographic evidence for pulmonary hypertension a right heart and left heart catheterization was performed after consent was obtained for this procedure.   PROCEDURE:   The patient was brought to the second floor [facility] cath lab in the postabsorptive state. Versed 2 mg and fentanyl 50 mcg were administered for conscious sedation. The right groin was prepped and draped in sterile fashion and a 5 [place] arterial sheath and 7 French venous sheath were inserted without difficulty. A Swan-Ganz catheter was advanced into the venous sheath and pressures were obtained in the right atrium, right ventricle, pulmonary artery, and pulmonary capillary wedge position. Cardiac outputs were obtained by the thermodilution and assumed Fick methods. Oxygen saturation was obtained in the pulmonary artery and aorta. A pigtail catheter was inserted and simultaneous AO/PA pressures were recorded. The pigtail catheter was  advanced into the left ventricle and simultaneous left ventricular and PCW pressures were recorded. Left ventriculography was performed in the RAO projection.  A left ventricle to aorta pullback was performed. The pigtail catheter was then removed and diagnostic catheterization to delineate the coronary anatomy was performed utilizing 5 French Judkins 4 left and right diagnostic catheters. All catheters were removed and the patient. Hemostasis was obtained by direct manual pressure. The patient tolerated the procedure well and returned to his room in satisfactory condition.   HEMODYNAMICS:   RA: A wave 6;  mean 5 RV: 30/ 5 PA: 30/14 PC: a 12 v 10; mean 10 initially;   LV: 134/19 PC: a 21 v 17;ean 18 AO: 133/56  Oxygen saturation in the aorta 98% and the pulmonary artery 66%  Cardiac output: 3.9 l/min (Thermo); 4.8 (Fick)  Cardiac index: 2.2 l/m/m2                2.7  ANGIOGRAPHY:   Left main: Angiographically normal and bifurcated into an LAD and left circumflex vessel.  LAD: Large caliber vessel which immediately gave rise to a proximal diagonal vessel.  There was an 80% focal stenosis in the proximal portion of this diagonal 1 vessel.  The LAD had diffuse stenoses of 80-90% proximally after the  Diagonal-1 vessel and then had another area of 80% stenosis proximal and distal to the second diagonal vessel with narrowing of 70-80% ostially in this second diagonal vessel.  There were significant septal collateral supplying the distal RCA.  The LAD extended to the LV apex.  Left circumflex: Large caliber vessel that gave rise to 2 major marginal branches  and was free of significant obstructive disease.   Right coronary artery:  Small to moderate size vessel that was subtotally/totally occluded after the takeoff of the first RV marginal branch and then was totally occluded after the acute margin.  There were moderate left to right collaterals originating predominantly from LAD septal perforating  arteries.  Left ventriculography revealed a dilated left ventricle with severe global hypokinesis and an ejection fraction of 10-15%.   IMPRESSION:  Severe ischemic cardiomyopathy with an ejection fraction of 10-15%.  Severe two-vessel coronary obstructive disease with 80-90% diffuse proximal LAD stenoses with 80% focal stenosis in a large first diagonal vessel and 80% ostial stenosis of the second diagonal vessel, and total occlusion of the mid RCA proximal to the acute margin with predominant right to left collaterals evidence for normal left circumflex coronary artery.   RECOMMENDATION:  Ms. Vergara denies any  history of significant shortness of breath prior to her current presentation with acute systolic heart failure and non-ST segment elevation myocardial infarction.  She has severe LV dysfunction and findings compatible with an ischemic cardiomyopathy.  This may represent stunning versus hibernating myocardium due to her diffuse high-grade LAD and RCA disease with troponin elevation to 18.  Her pulmonary pressures today have significantly improved from 50 mm on her echo Doppler study and today are 30 mm PA pressure on right heart catheterization. Options were discussed with the patient in detail concerning possible surgical evaluation and evaluation for viable myocardium. The patient has made up her mind that she does not want CABG revascularization.  If percutaneous coronary intervention is to be performed this will be high risk  and may require PCI with IABP/Impella for cardiac support during the intervention.  The patient will be hydrated gently following her procedure.  Further discussion with the patient and family will be made as well as with colleagues prior to final determination.  Tentative plans will be for consideration of possible high risk PCI to the LAD in approximately 48 hours.  The patient will be heparinized and started on IV nitroglycerin.   Troy Sine, MD,  [facility] 12/28/2013 4:13 PM

## 2013-12-28 NOTE — Care Management Note (Addendum)
    Page 1 of 1   01/04/2014     4:55:02 PM CARE MANAGEMENT NOTE 01/04/2014  Patient:  EZRI, FANGUY   Account Number:  [id number]  Date Initiated:  12/28/2013  Documentation initiated by:  Elissa Hefty  Subjective/Objective Assessment:   adm w pul edema     Action/Plan:   lives alone   Anticipated DC Date:  01/05/2014   Anticipated DC Plan:  [place]         Choice offered to / List presented to:     DME arranged  OTHER - SEE COMMENT      DME agency  OTHER - SEE NOTE        Status of service:   Medicare Important Message given?  YES (If response is "NO", the following Medicare IM given date fields will be blank) Date Medicare IM given:  12/28/2013 Medicare IM given by:  Elissa Hefty Date Additional Medicare IM given:  01/04/2014 Additional Medicare IM given by:  Damika Harmon  Discharge Disposition:    Per UR Regulation:  Reviewed for med. necessity/level of care/duration of stay  If discussed at [place] of Stay Meetings, dates discussed:   12/31/2013    Comments:  01/04/14 Ellan Lambert, RN, BSN [phone] Pt with EF 15%; will need Lifevest.  Kerrin Mo with [employer]/ [employer] has been contacted, and pt to be fitted hopefully later today.  Would recommend PT consult to assess for home needs.

## 2013-12-29 DIAGNOSIS — I25750 Atherosclerosis of native coronary artery of transplanted heart with unstable angina: Secondary | ICD-10-CM

## 2013-12-29 LAB — CBC
HCT: 38.6 % (ref 36.0–46.0)
Hemoglobin: 11.7 g/dL — ABNORMAL LOW (ref 12.0–15.0)
MCH: 24.1 pg — ABNORMAL LOW (ref 26.0–34.0)
MCHC: 30.3 g/dL (ref 30.0–36.0)
MCV: 79.6 fL (ref 78.0–100.0)
PLATELETS: 184 10*3/uL (ref 150–400)
RBC: 4.85 MIL/uL (ref 3.87–5.11)
RDW: 15.2 % (ref 11.5–15.5)
WBC: 5.7 10*3/uL (ref 4.0–10.5)

## 2013-12-29 LAB — BASIC METABOLIC PANEL
Anion gap: 13 (ref 5–15)
BUN: 17 mg/dL (ref 6–23)
CO2: 23 mEq/L (ref 19–32)
Calcium: 8.9 mg/dL (ref 8.4–10.5)
Chloride: 105 mEq/L (ref 96–112)
Creatinine, Ser: 0.68 mg/dL (ref 0.50–1.10)
GFR, EST AFRICAN AMERICAN: 89 mL/min — AB (ref >90)
GFR, EST NON AFRICAN AMERICAN: 77 mL/min — AB (ref >90)
Glucose, Bld: 102 mg/dL — ABNORMAL HIGH (ref 70–99)
Potassium: 4.5 mEq/L (ref 3.7–5.3)
SODIUM: 141 meq/L (ref 137–147)

## 2013-12-29 LAB — HEPARIN LEVEL (UNFRACTIONATED): Heparin Unfractionated: 0.37 IU/mL (ref 0.30–0.70)

## 2013-12-29 MED ORDER — SODIUM CHLORIDE 0.9 % IJ SOLN: 3.0000 mL | INTRAMUSCULAR | Status: DC | PRN

## 2013-12-29 MED ORDER — ASPIRIN EC 81 MG PO TBEC
81.0000 mg | DELAYED_RELEASE_TABLET | Freq: Every day | ORAL | Status: DC
  Administered 2013-12-31 – 2014-01-05 (×6): 81 mg via ORAL
  Filled 2013-12-29 (×6): qty 1

## 2013-12-29 MED ORDER — SODIUM CHLORIDE 0.9 % IV SOLN
INTRAVENOUS | Status: DC
  Administered 2013-12-30: 50 mL/h via INTRAVENOUS

## 2013-12-29 MED ORDER — CLOPIDOGREL BISULFATE 75 MG PO TABS
75.0000 mg | ORAL_TABLET | Freq: Every day | ORAL | Status: AC
  Administered 2013-12-30: 75 mg via ORAL
  Filled 2013-12-29 (×3): qty 1

## 2013-12-29 MED ORDER — SODIUM CHLORIDE 0.9 % IJ SOLN
3.0000 mL | Freq: Two times a day (BID) | INTRAMUSCULAR | Status: DC
  Administered 2013-12-29 – 2013-12-30 (×3): 3 mL via INTRAVENOUS

## 2013-12-29 MED ORDER — SODIUM CHLORIDE 0.9 % IV SOLN: 250.0000 mL | INTRAVENOUS | Status: DC | PRN

## 2013-12-29 MED ORDER — ASPIRIN 81 MG PO CHEW
81.0000 mg | CHEWABLE_TABLET | ORAL | Status: AC
  Administered 2013-12-30: 81 mg via ORAL
  Filled 2013-12-29: qty 1

## 2013-12-29 MED ORDER — CLOPIDOGREL BISULFATE 300 MG PO TABS
600.0000 mg | ORAL_TABLET | Freq: Once | ORAL | Status: AC
  Administered 2013-12-29: 600 mg via ORAL
  Filled 2013-12-29: qty 2

## 2013-12-29 NOTE — Plan of Care (Signed)
Problem: Consults Goal: Skin Care Protocol Initiated - if Braden Score 18 or less If consults are not indicated, leave blank or document N/A  Outcome: Not Applicable Date Met:  12/29/13 Goal: Tobacco Cessation referral if indicated Outcome: Not Applicable Date Met:  12/29/13 Goal: Nutrition Consult-if indicated Outcome: Not Applicable Date Met:  12/29/13 Goal: Diabetes Guidelines if Diabetic/Glucose > 140 If diabetic or lab glucose is > 140 mg/dl - Initiate Diabetes/Hyperglycemia Guidelines & Document Interventions  Outcome: Not Applicable Date Met:  12/29/13  Problem: Phase I Progression Outcomes Goal: Pain controlled with appropriate interventions Outcome: Completed/Met Date Met:  12/29/13 Goal: Other Phase I Outcomes/Goals Outcome: Not Applicable Date Met:  12/29/13  Problem: Phase II Progression Outcomes Goal: Pain controlled Outcome: Completed/Met Date Met:  12/29/13 Goal: Dyspnea controlled with activity Outcome: Completed/Met Date Met:  12/29/13 Goal: Walk in hall or up in chair TID Outcome: Completed/Met Date Met:  12/29/13 Goal: Discharge plan established Outcome: Completed/Met Date Met:  12/29/13 Goal: Fluid volume status improved Outcome: Completed/Met Date Met:  12/29/13 Goal: Case manager referral Outcome: Completed/Met Date Met:  12/29/13     

## 2013-12-29 NOTE — Progress Notes (Signed)
TELEMETRY: Reviewed telemetry pt in NSR, 10 beat run of NSVT: Filed Vitals:   12/29/13 0700 12/29/13 0737 12/29/13 0754 12/29/13 0800  BP: 97/67 97/67 125/71   Pulse:  69  76  Temp:   97.7 F (36.5 C)   TempSrc:   Oral   Resp:      Height:      Weight:      SpO2:    98%    Intake/Output Summary (Last 24 hours) at 12/29/13 0814 Last data filed at 12/29/13 0800  Gross per 24 hour  Intake 807.25 ml  Output    530 ml  Net 277.25 ml   Filed Weights   12/27/13 0300 12/28/13 0300 12/29/13 0500  Weight: 154 lb 5.2 oz (70 kg) 152 lb 6.4 oz (69.128 kg) 155 lb 3.2 oz (70.398 kg)    Subjective Patient denies any chest pain or SOB. Up ambulating.   Marland Kitchen aspirin EC  81 mg Oral Daily  . aspirin EC  81 mg Oral Daily  . atorvastatin  40 mg Oral q1800  . budesonide-formoterol  2 puff Inhalation BID  . carvedilol  6.25 mg Oral BID WC  . furosemide  40 mg Oral Daily  . lisinopril  5 mg Oral Daily  . sodium chloride  3 mL Intravenous Q12H   . heparin 1,100 Units/hr (12/29/13 0800)  . nitroGLYCERIN Stopped (12/29/13 0102)    LABS: Basic Metabolic Panel:  Recent Labs  12/27/13 0215 12/28/13 0255  NA 142 144  K 3.8 4.0  CL 102 102  CO2 27 28  GLUCOSE 105* 106*  BUN 22 19  CREATININE 0.79 0.75  CALCIUM 8.9 9.0   Liver Function Tests:  Recent Labs  12/27/13 0215  AST 40*  ALT 37*  ALKPHOS 86  BILITOT 0.6  PROT 5.9*  ALBUMIN 3.1*   No results for input(s): LIPASE, AMYLASE in the last 72 hours. CBC:  Recent Labs  12/27/13 0215 12/28/13 0255  WBC 5.5 5.1  HGB 10.7* 11.4*  HCT 35.3* 37.2  MCV 79.7 79.5  PLT 206 189   Cardiac Enzymes: No results for input(s): CKTOTAL, CKMB, CKMBINDEX, TROPONINI in the last 72 hours. BNP: No results for input(s): PROBNP in the last 72 hours. D-Dimer: No results for input(s): DDIMER in the last 72 hours. Hemoglobin A1C: No results for input(s): HGBA1C in the last 72 hours. Fasting Lipid Panel:  Recent Labs  12/27/13 0215   CHOL 158  HDL 59  LDLCALC 82  TRIG 87  CHOLHDL 2.7   Thyroid Function Tests: No results for input(s): TSH, T4TOTAL, T3FREE, THYROIDAB in the last 72 hours.  Invalid input(s): FREET3   Radiology/Studies:  PORTABLE CHEST - 1 VIEW  COMPARISON: 05/11/2013.  FINDINGS: Enlarged cardiac silhouette without significant change. Increased prominence of the pulmonary vasculature and interstitial markings. Minimal right pleural effusion. Diffuse osteopenia. Marked bilateral shoulder degenerative changes.  IMPRESSION: Interval changes of acute congestive heart failure with stable cardiomegaly.   Electronically Signed By: Enrique Sack M.D. On: 12/25/2013 07:18  Ecg: NSR with LBBB. I have personally reviewed and interpreted this study.   Echo:Study Conclusions  - Left ventricle: The cavity size was normal. Wall thickness was increased in a pattern of moderate LVH. The estimated ejection fraction was 15%. Severe global hypokinesis with akinesis of the inferior, basal inferoseptal and apical walls. diastolic dysfunction (probably restrictive) with very high LV filling pressure. - Aortic valve: Poorly visualized. Mildly calcified leaflets. There was no stenosis. There was no regurgitation. -  Mitral valve: Calcified annulus. Mildly thickened leaflets . There was moderate regurgitation. - Left atrium: Moderately dilated at 44 ml/m2. - Right ventricle: The cavity size was normal. Wall thickness was normal. Systolic function was normal. Lateral annulus peak S velocity: 13.2 cm/s. - Right atrium: The atrium was normal in size. - Atrial septum: Aneurysmal interatrial septum. PFO cannot be excluded. - Tricuspid valve: There was moderate regurgitation. - Pulmonary arteries: PA peak pressure: 50 mm Hg (S).  Impressions:  - LVEF 15%, severe global hypokinesis with inferior, inferoseptal and apical akinesis, moderate LVH, restrictive diastology wtih very  high LV filling pressures, moderate MR and TR, moderately dilated LA, atrial septal aneurysm (cannot exclude PFO), moderate pulmonary hypertension - RVSP 50 mmHg.  RIGHT AND LEFT HEART CARDIAC CATHETERIZATION   HISTORY:  Beth Johnston is a 80 y.o. female who was admitted on 12/25/2013 with acute systolic heart failure and elevated troponin at 18. ECG showed left bundle branch block. Previously, the patient denied any significant history of any shortness of breath, although had a history of asthma. She ruled in for non-ST segment elevation MI. An echo Doppler study revealed an ejection fraction of 15% with severe global hypokinesis and akinesis of the inferior, basal, inferoseptal, and apical walls. She had restrictive physiology and moderate mitral regurgitation. There was moderate pulmonary hypertension with a PA estimated pressure at 50 mm. She is now referred for cardiac catheterization. With her history of an shortness of breath, marked LV dysfunction, and echo cardiographic evidence for pulmonary hypertension a right heart and left heart catheterization was performed after consent was obtained for this procedure.   PROCEDURE:   The patient was brought to the second floor [facility] cath lab in the postabsorptive state. Versed 2 mg and fentanyl 50 mcg were administered for conscious sedation. The right groin was prepped and draped in sterile fashion and a 5 [place] arterial sheath and 7 French venous sheath were inserted without difficulty. A Swan-Ganz catheter was advanced into the venous sheath and pressures were obtained in the right atrium, right ventricle, pulmonary artery, and pulmonary capillary wedge position. Cardiac outputs were obtained by the thermodilution and assumed Fick methods. Oxygen saturation was obtained in the pulmonary artery and aorta. A pigtail catheter was inserted and simultaneous AO/PA pressures were recorded. The pigtail catheter was advanced into  the left ventricle and simultaneous left ventricular and PCW pressures were recorded. Left ventriculography was performed in the RAO projection. A left ventricle to aorta pullback was performed. The pigtail catheter was then removed and diagnostic catheterization to delineate the coronary anatomy was performed utilizing 5 French Judkins 4 left and right diagnostic catheters. All catheters were removed and the patient. Hemostasis was obtained by direct manual pressure. The patient tolerated the procedure well and returned to his room in satisfactory condition.   HEMODYNAMICS:   RA: A wave 6; mean 5 RV: 30/ 5 PA: 30/14 PC: a 12 v 10; mean 10 initially;   LV: 134/19 PC: a 21 v 17;ean 18 AO: 133/56  Oxygen saturation in the aorta 98% and the pulmonary artery 66%  Cardiac output: 3.9 l/min (Thermo); 4.8 (Fick)  Cardiac index: 2.2 l/m/m2 2.7  ANGIOGRAPHY:   Left main: Angiographically normal and bifurcated into an LAD and left circumflex vessel.  LAD: Large caliber vessel which immediately gave rise to a proximal diagonal vessel. There was an 80% focal stenosis in the proximal portion of this diagonal 1 vessel. The LAD had diffuse stenoses of 80-90% proximally after  the Diagonal-1 vessel and then had another area of 80% stenosis proximal and distal to the second diagonal vessel with narrowing of 70-80% ostially in this second diagonal vessel. There were significant septal collateral supplying the distal RCA. The LAD extended to the LV apex.  Left circumflex: Large caliber vessel that gave rise to 2 major marginal branches and was free of significant obstructive disease.   Right coronary artery: Small to moderate size vessel that was subtotally/totally occluded after the takeoff of the first RV marginal branch and then was totally occluded after the acute margin. There were moderate left to right collaterals originating predominantly from LAD septal perforating  arteries.  Left ventriculography revealed a dilated left ventricle with severe global hypokinesis and an ejection fraction of 10-15%.   IMPRESSION:  Severe ischemic cardiomyopathy with an ejection fraction of 10-15%.  Severe two-vessel coronary obstructive disease with 80-90% diffuse proximal LAD stenoses with 80% focal stenosis in a large first diagonal vessel and 80% ostial stenosis of the second diagonal vessel, and total occlusion of the mid RCA proximal to the acute margin with predominant right to left collaterals evidence for normal left circumflex coronary artery.   RECOMMENDATION:  Ms. Leske denies any history of significant shortness of breath prior to her current presentation with acute systolic heart failure and non-ST segment elevation myocardial infarction. She has severe LV dysfunction and findings compatible with an ischemic cardiomyopathy. This may represent stunning versus hibernating myocardium due to her diffuse high-grade LAD and RCA disease with troponin elevation to 18. Her pulmonary pressures today have significantly improved from 50 mm on her echo Doppler study and today are 30 mm PA pressure on right heart catheterization. Options were discussed with the patient in detail concerning possible surgical evaluation and evaluation for viable myocardium. The patient has made up her mind that she does not want CABG revascularization. If percutaneous coronary intervention is to be performed this will be high risk and may require PCI with IABP/Impella for cardiac support during the intervention. The patient will be hydrated gently following her procedure. Further discussion with the patient and family will be made as well as with colleagues prior to final determination. Tentative plans will be for consideration of possible high risk PCI to the LAD in approximately 48 hours. The patient will be heparinized and started on IV nitroglycerin.   Troy Sine, MD,  [facility] 12/28/2013 4:13 PM  PHYSICAL EXAM General: Well developed, elderly, in no acute distress. Head: Normocephalic, atraumatic, sclera non-icteric, oropharynx is clear Neck: Negative for carotid bruits. JVD not elevated. No adenopathy Lungs: Clear bilaterally to auscultation without wheezes, rales, or rhonchi. Breathing is unlabored. Heart: RRR S1 S2 without gallop. Grade 2/6 systolic murmur at the apex. Abdomen: Soft, non-tender, non-distended with normoactive bowel sounds. No hepatomegaly. No rebound/guarding. No obvious abdominal masses. Msk:  Strength and tone appears normal for age. Extremities: No clubbing, cyanosis or edema.  Distal pedal pulses are 2+ and equal bilaterally. No right groin hematoma. Neuro: Alert and oriented X 3. Moves all extremities spontaneously. Psych:  Responds to questions appropriately with a normal affect.  ASSESSMENT AND PLAN: 1. NSTEMI. Underlying LBBB. Pain free on IV heparin. On beta blocker and ASA. Results of cardiac cath reviewed personally. She has severe disease in the proximal to mid LAD. The RCA is codominant and occluded in the mid vessel. EF 10-15%. Patient refuses to consider CABG. She is amenable to consideration of high risk PCI. I reviewed study with Dr. Burt Knack and Dr. Angelena Form.  Will plan PCI of the LAD with potential rotational atherectomy tomorrow. Possible support with Impella or IABP if femoral artery anatomy allows. Will monitor renal function today and in am. Plan on starting P2Y12 inhibitor today. Patient is a DNR but is willing to suspend this for her procedure.  2. Acute systolic CHF. EF 15% by Echo. Diuresed well on IV lasix. I/O negative 4 L since admit. On low dose Coreg, lisinopril, and lasix. BP will not allow further titration at this time. 3. Moderate MR. 4. Moderate pulmonary HTN by Echo but pulmonary pressures on right heart cath were normal. 5. Anemia. Follow up CBC today on IV heparin. 6. LBBB  Present on Admission:  .  Acute pulmonary edema . Elevated troponin . LBBB (left bundle branch block) . Asthma, persistent controlled  Signed, Binyomin Brann [place], [place] 12/29/2013 8:14 AM

## 2013-12-29 NOTE — Progress Notes (Signed)
ANTICOAGULATION CONSULT NOTE - Follow Up Consult  Pharmacy Consult for Heparin  Indication: chest pain/ACS  No Known Allergies  Patient Measurements: Height: 5\' 6"  (167.6 cm) Weight: 155 lb 3.2 oz (70.398 kg) IBW/kg (Calculated) : 59.3  Vital Signs: Temp: 97.7 F (36.5 C) (12/01 0754) Temp Source: Oral (12/01 0754) BP: 125/71 mmHg (12/01 0754) Pulse Rate: 76 (12/01 0800)  Labs:  Recent Labs  12/27/13 0215  12/27/13 2003 12/28/13 0255 12/29/13 0758  HGB 10.7*  --   --  11.4* 11.7*  HCT 35.3*  --   --  37.2 38.6  PLT 206  --   --  189 184  LABPROT  --   --   --  13.9  --   INR  --   --   --  1.05  --   HEPARINUNFRC 0.15*  < > 0.43 0.41 0.37  CREATININE 0.79  --   --  0.75 0.68  < > = values in this interval not displayed.  Estimated Creatinine Clearance: 47.3 mL/min (by C-G formula based on Cr of 0.68).   Assessment: 80 yo F admitted on 12/25/2013 with SOB and mildly elevated trop, cath with 2V CAD, pt refusing CABG, pharmacy consulted to resume heparin pending PCI 12/2.    PMH: asthma, chronic bronchitis, arthritis  Anticoagulation: NSTEMI -- heparin level is at goal, CBC stable, no bleeding noted.  Nephrology: CrCl 45-50 ml/min  K wnl, no Mg  PTA Medication Issues: home meds not ordered: flonase Best Practices: DVT PX: hep gtt  Goal of Therapy:  Heparin level 0.3-0.7 units/ml Monitor platelets by anticoagulation protocol: Yes   Plan:  Continue heparin gtt 1100 units/hr Daily heparin level and CBC  Hassie Bruce, Pharm. D. Clinical Pharmacy Resident Pager: [phone] Ph: [phone] 12/29/2013 11:22 AM

## 2013-12-29 NOTE — Progress Notes (Signed)
Interventional Cardiology Note:   I was asked to review Ms. Calabria cath films. She was admitted with CHF and a NSTEMI. She underwent cardiac cath 12/28/13 per Dr. Claiborne Billings and was found to have severe LV systolic dysfunction, severe calcified proximal and mid LAD stenosis. She has refused consideration for CABG. The decision has been made to proceed with high risk PCI of the proximal and mid LAD with rotablator atherectomy. We will plan to use an Impella hemodynamic support device if her vasculature allows. Will consider IABP for support if this is felt to optimal. I have had a long discussion with the patient and two daughters today. They wish to proceed with the procedure which will be planned for 11am on 12/30/13. Risks include CVA, MI, renal failure, bleeding, infection, death. NPO at midnight. Plavix 600 mg po x 1 today.   Catheryn Slifer 12/29/2013 10:24 AM

## 2013-12-30 ENCOUNTER — Encounter (HOSPITAL_COMMUNITY)
Admission: EM | Disposition: A | Discharge status: Home / Self Care | Payer: Medicare Other | Source: Home / Self Care | Attending: Internal Medicine

## 2013-12-30 DIAGNOSIS — I5021 Acute systolic (congestive) heart failure: Secondary | ICD-10-CM

## 2013-12-30 DIAGNOSIS — I214 Non-ST elevation (NSTEMI) myocardial infarction: Secondary | ICD-10-CM

## 2013-12-30 DIAGNOSIS — I2510 Atherosclerotic heart disease of native coronary artery without angina pectoris: Secondary | ICD-10-CM

## 2013-12-30 HISTORY — PX: PERCUTANEOUS CORONARY STENT INTERVENTION (PCI-S): SHX5485

## 2013-12-30 LAB — HEPARIN LEVEL (UNFRACTIONATED): HEPARIN UNFRACTIONATED: 0.3 [IU]/mL (ref 0.30–0.70)

## 2013-12-30 LAB — BASIC METABOLIC PANEL
Anion gap: 12 (ref 5–15)
BUN: 17 mg/dL (ref 6–23)
CHLORIDE: 105 meq/L (ref 96–112)
CO2: 25 mEq/L (ref 19–32)
CREATININE: 0.7 mg/dL (ref 0.50–1.10)
Calcium: 9.1 mg/dL (ref 8.4–10.5)
GFR calc Af Amer: 88 mL/min — ABNORMAL LOW (ref >90)
GFR calc non Af Amer: 76 mL/min — ABNORMAL LOW (ref >90)
GLUCOSE: 118 mg/dL — AB (ref 70–99)
POTASSIUM: 4.3 meq/L (ref 3.7–5.3)
Sodium: 142 mEq/L (ref 137–147)

## 2013-12-30 LAB — CBC
HCT: 35.9 % — ABNORMAL LOW (ref 36.0–46.0)
HEMOGLOBIN: 10.9 g/dL — AB (ref 12.0–15.0)
MCH: 24.2 pg — ABNORMAL LOW (ref 26.0–34.0)
MCHC: 30.4 g/dL (ref 30.0–36.0)
MCV: 79.6 fL (ref 78.0–100.0)
Platelets: 167 10*3/uL (ref 150–400)
RBC: 4.51 MIL/uL (ref 3.87–5.11)
RDW: 15.1 % (ref 11.5–15.5)
WBC: 4.8 10*3/uL (ref 4.0–10.5)

## 2013-12-30 LAB — MAGNESIUM: Magnesium: 2 mg/dL (ref 1.5–2.5)

## 2013-12-30 LAB — POCT ACTIVATED CLOTTING TIME: ACTIVATED CLOTTING TIME: 325 s

## 2013-12-30 SURGERY — PERCUTANEOUS CORONARY STENT INTERVENTION (PCI-S)
Anesthesia: LOCAL

## 2013-12-30 MED ORDER — SODIUM CHLORIDE 0.9 % IV SOLN
INTRAVENOUS | Status: AC
  Administered 2013-12-30: 16:00:00 via INTRAVENOUS

## 2013-12-30 MED ORDER — SODIUM CHLORIDE 0.9 % IV SOLN
1.7500 mg/kg/h | INTRAVENOUS | Status: DC
  Filled 2013-12-30: qty 250

## 2013-12-30 MED ORDER — HEPARIN (PORCINE) IN NACL 100-0.45 UNIT/ML-% IJ SOLN
1150.0000 [IU]/h | INTRAMUSCULAR | Status: DC
  Administered 2013-12-30: 1150 [IU]/h via INTRAVENOUS

## 2013-12-30 MED ORDER — FENTANYL CITRATE 0.05 MG/ML IJ SOLN
INTRAMUSCULAR | Status: AC
  Filled 2013-12-30: qty 2

## 2013-12-30 MED ORDER — FENTANYL CITRATE 0.05 MG/ML IJ SOLN
25.0000 ug | Freq: Once | INTRAMUSCULAR | Status: AC
  Administered 2013-12-30: 25 ug via INTRAVENOUS

## 2013-12-30 MED ORDER — BIVALIRUDIN 250 MG IV SOLR
INTRAVENOUS | Status: AC
  Filled 2013-12-30: qty 250

## 2013-12-30 MED ORDER — FENTANYL CITRATE 0.05 MG/ML IJ SOLN
25.0000 ug | INTRAMUSCULAR | Status: DC | PRN
Start: 2013-12-30 — End: 2013-12-30

## 2013-12-30 MED ORDER — NITROGLYCERIN 1 MG/10 ML FOR IR/CATH LAB
INTRA_ARTERIAL | Status: AC
  Filled 2013-12-30: qty 10

## 2013-12-30 MED ORDER — MIDAZOLAM HCL 2 MG/2ML IJ SOLN
INTRAMUSCULAR | Status: AC
  Filled 2013-12-30: qty 2

## 2013-12-30 MED ORDER — VERAPAMIL HCL 2.5 MG/ML IV SOLN
INTRAVENOUS | Status: AC
  Filled 2013-12-30: qty 4

## 2013-12-30 MED ORDER — NITROGLYCERIN IN D5W 200-5 MCG/ML-% IV SOLN
INTRAVENOUS | Status: AC
  Filled 2013-12-30: qty 250

## 2013-12-30 MED ORDER — HEPARIN (PORCINE) IN NACL 2-0.9 UNIT/ML-% IJ SOLN
INTRAMUSCULAR | Status: AC
  Filled 2013-12-30: qty 1000

## 2013-12-30 MED ORDER — CLOPIDOGREL BISULFATE 75 MG PO TABS
75.0000 mg | ORAL_TABLET | Freq: Every day | ORAL | Status: DC
  Administered 2013-12-31 – 2014-01-05 (×6): 75 mg via ORAL
  Filled 2013-12-30 (×6): qty 1

## 2013-12-30 MED ORDER — HEPARIN SODIUM (PORCINE) 1000 UNIT/ML IJ SOLN
INTRAMUSCULAR | Status: AC
  Filled 2013-12-30: qty 1

## 2013-12-30 MED ORDER — LIDOCAINE HCL (PF) 1 % IJ SOLN
INTRAMUSCULAR | Status: AC
  Filled 2013-12-30: qty 30

## 2013-12-30 NOTE — Progress Notes (Signed)
Dr. Angelena Form at bedside, pressure released from femstop.  Will continue to monitor groins.

## 2013-12-30 NOTE — Progress Notes (Signed)
TELEMETRY: Reviewed telemetry pt in NSR: Filed Vitals:   12/29/13 2000 12/30/13 0000 12/30/13 0500 12/30/13 0736  BP: 138/98 135/64 125/58 127/61  Pulse: 97 87  69  Temp: 98 F (36.7 C)   97.6 F (36.4 C)  TempSrc: Oral   Oral  Resp:      Height:      Weight:   156 lb 8.4 oz (71 kg)   SpO2: 97% 98% 97% 97%    Intake/Output Summary (Last 24 hours) at 12/30/13 0824 Last data filed at 12/30/13 0800  Gross per 24 hour  Intake 1446.17 ml  Output      0 ml  Net 1446.17 ml   Filed Weights   12/28/13 0300 12/29/13 0500 12/30/13 0500  Weight: 152 lb 6.4 oz (69.128 kg) 155 lb 3.2 oz (70.398 kg) 156 lb 8.4 oz (71 kg)    Subjective Patient denies any chest pain or SOB. Up ambulating. Feels great- ready for her procedure.  Derrill Memo ON 12/31/2013] aspirin EC  81 mg Oral Daily  . atorvastatin  40 mg Oral q1800  . budesonide-formoterol  2 puff Inhalation BID  . carvedilol  6.25 mg Oral BID WC  . furosemide  40 mg Oral Daily  . lisinopril  5 mg Oral Daily  . sodium chloride  3 mL Intravenous Q12H   . sodium chloride 50 mL/hr (12/30/13 0543)  . heparin 1,100 Units/hr (12/29/13 2235)  . nitroGLYCERIN Stopped (12/29/13 0102)    LABS: Basic Metabolic Panel:  Recent Labs  12/29/13 0758 12/30/13 0400  NA 141 142  K 4.5 4.3  CL 105 105  CO2 23 25  GLUCOSE 102* 118*  BUN 17 17  CREATININE 0.68 0.70  CALCIUM 8.9 9.1  MG  --  2.0   Liver Function Tests: No results for input(s): AST, ALT, ALKPHOS, BILITOT, PROT, ALBUMIN in the last 72 hours. No results for input(s): LIPASE, AMYLASE in the last 72 hours. CBC:  Recent Labs  12/29/13 0758 12/30/13 0400  WBC 5.7 4.8  HGB 11.7* 10.9*  HCT 38.6 35.9*  MCV 79.6 79.6  PLT 184 167   Cardiac Enzymes: No results for input(s): CKTOTAL, CKMB, CKMBINDEX, TROPONINI in the last 72 hours. BNP: No results for input(s): PROBNP in the last 72 hours. D-Dimer: No results for input(s): DDIMER in the last 72 hours. Hemoglobin A1C: No  results for input(s): HGBA1C in the last 72 hours. Fasting Lipid Panel: No results for input(s): CHOL, HDL, LDLCALC, TRIG, CHOLHDL, LDLDIRECT in the last 72 hours. Thyroid Function Tests: No results for input(s): TSH, T4TOTAL, T3FREE, THYROIDAB in the last 72 hours.  Invalid input(s): FREET3   Radiology/Studies:  PORTABLE CHEST - 1 VIEW  COMPARISON: 05/11/2013.  FINDINGS: Enlarged cardiac silhouette without significant change. Increased prominence of the pulmonary vasculature and interstitial markings. Minimal right pleural effusion. Diffuse osteopenia. Marked bilateral shoulder degenerative changes.  IMPRESSION: Interval changes of acute congestive heart failure with stable cardiomegaly.   Electronically Signed By: Enrique Sack M.D. On: 12/25/2013 07:18  Ecg: NSR with LBBB. I have personally reviewed and interpreted this study.   Echo:Study Conclusions  - Left ventricle: The cavity size was normal. Wall thickness was increased in a pattern of moderate LVH. The estimated ejection fraction was 15%. Severe global hypokinesis with akinesis of the inferior, basal inferoseptal and apical walls. diastolic dysfunction (probably restrictive) with very high LV filling pressure. - Aortic valve: Poorly visualized. Mildly calcified leaflets. There was no stenosis. There was no regurgitation. -  Mitral valve: Calcified annulus. Mildly thickened leaflets . There was moderate regurgitation. - Left atrium: Moderately dilated at 44 ml/m2. - Right ventricle: The cavity size was normal. Wall thickness was normal. Systolic function was normal. Lateral annulus peak S velocity: 13.2 cm/s. - Right atrium: The atrium was normal in size. - Atrial septum: Aneurysmal interatrial septum. PFO cannot be excluded. - Tricuspid valve: There was moderate regurgitation. - Pulmonary arteries: PA peak pressure: 50 mm Hg (S).  Impressions:  - LVEF 15%, severe global  hypokinesis with inferior, inferoseptal and apical akinesis, moderate LVH, restrictive diastology wtih very high LV filling pressures, moderate MR and TR, moderately dilated LA, atrial septal aneurysm (cannot exclude PFO), moderate pulmonary hypertension - RVSP 50 mmHg.  RIGHT AND LEFT HEART CARDIAC CATHETERIZATION   HISTORY:  Beth Johnston is a 80 y.o. female who was admitted on 12/25/2013 with acute systolic heart failure and elevated troponin at 18. ECG showed left bundle branch block. Previously, the patient denied any significant history of any shortness of breath, although had a history of asthma. She ruled in for non-ST segment elevation MI. An echo Doppler study revealed an ejection fraction of 15% with severe global hypokinesis and akinesis of the inferior, basal, inferoseptal, and apical walls. She had restrictive physiology and moderate mitral regurgitation. There was moderate pulmonary hypertension with a PA estimated pressure at 50 mm. She is now referred for cardiac catheterization. With her history of an shortness of breath, marked LV dysfunction, and echo cardiographic evidence for pulmonary hypertension a right heart and left heart catheterization was performed after consent was obtained for this procedure.   PROCEDURE:   The patient was brought to the second floor [facility] cath lab in the postabsorptive state. Versed 2 mg and fentanyl 50 mcg were administered for conscious sedation. The right groin was prepped and draped in sterile fashion and a 5 [place] arterial sheath and 7 French venous sheath were inserted without difficulty. A Swan-Ganz catheter was advanced into the venous sheath and pressures were obtained in the right atrium, right ventricle, pulmonary artery, and pulmonary capillary wedge position. Cardiac outputs were obtained by the thermodilution and assumed Fick methods. Oxygen saturation was obtained in the pulmonary artery and aorta. A  pigtail catheter was inserted and simultaneous AO/PA pressures were recorded. The pigtail catheter was advanced into the left ventricle and simultaneous left ventricular and PCW pressures were recorded. Left ventriculography was performed in the RAO projection. A left ventricle to aorta pullback was performed. The pigtail catheter was then removed and diagnostic catheterization to delineate the coronary anatomy was performed utilizing 5 French Judkins 4 left and right diagnostic catheters. All catheters were removed and the patient. Hemostasis was obtained by direct manual pressure. The patient tolerated the procedure well and returned to his room in satisfactory condition.   HEMODYNAMICS:   RA: A wave 6; mean 5 RV: 30/ 5 PA: 30/14 PC: a 12 v 10; mean 10 initially;   LV: 134/19 PC: a 21 v 17;ean 18 AO: 133/56  Oxygen saturation in the aorta 98% and the pulmonary artery 66%  Cardiac output: 3.9 l/min (Thermo); 4.8 (Fick)  Cardiac index: 2.2 l/m/m2 2.7  ANGIOGRAPHY:   Left main: Angiographically normal and bifurcated into an LAD and left circumflex vessel.  LAD: Large caliber vessel which immediately gave rise to a proximal diagonal vessel. There was an 80% focal stenosis in the proximal portion of this diagonal 1 vessel. The LAD had diffuse stenoses of 80-90% proximally after  the Diagonal-1 vessel and then had another area of 80% stenosis proximal and distal to the second diagonal vessel with narrowing of 70-80% ostially in this second diagonal vessel. There were significant septal collateral supplying the distal RCA. The LAD extended to the LV apex.  Left circumflex: Large caliber vessel that gave rise to 2 major marginal branches and was free of significant obstructive disease.   Right coronary artery: Small to moderate size vessel that was subtotally/totally occluded after the takeoff of the first RV marginal branch and then was totally occluded after the acute  margin. There were moderate left to right collaterals originating predominantly from LAD septal perforating arteries.  Left ventriculography revealed a dilated left ventricle with severe global hypokinesis and an ejection fraction of 10-15%.   IMPRESSION:  Severe ischemic cardiomyopathy with an ejection fraction of 10-15%.  Severe two-vessel coronary obstructive disease with 80-90% diffuse proximal LAD stenoses with 80% focal stenosis in a large first diagonal vessel and 80% ostial stenosis of the second diagonal vessel, and total occlusion of the mid RCA proximal to the acute margin with predominant right to left collaterals evidence for normal left circumflex coronary artery.   RECOMMENDATION:  Ms. Polyak denies any history of significant shortness of breath prior to her current presentation with acute systolic heart failure and non-ST segment elevation myocardial infarction. She has severe LV dysfunction and findings compatible with an ischemic cardiomyopathy. This may represent stunning versus hibernating myocardium due to her diffuse high-grade LAD and RCA disease with troponin elevation to 18. Her pulmonary pressures today have significantly improved from 50 mm on her echo Doppler study and today are 30 mm PA pressure on right heart catheterization. Options were discussed with the patient in detail concerning possible surgical evaluation and evaluation for viable myocardium. The patient has made up her mind that she does not want CABG revascularization. If percutaneous coronary intervention is to be performed this will be high risk and may require PCI with IABP/Impella for cardiac support during the intervention. The patient will be hydrated gently following her procedure. Further discussion with the patient and family will be made as well as with colleagues prior to final determination. Tentative plans will be for consideration of possible high risk PCI to the LAD in approximately 48  hours. The patient will be heparinized and started on IV nitroglycerin.   Troy Sine, MD, [facility] 12/28/2013 4:13 PM  PHYSICAL EXAM General: Well developed, elderly, in no acute distress. Head: Normocephalic, atraumatic, sclera non-icteric, oropharynx is clear Neck: Negative for carotid bruits. JVD not elevated. No adenopathy Lungs: Clear bilaterally to auscultation without wheezes, rales, or rhonchi. Breathing is unlabored. Heart: RRR S1 S2 without gallop. Grade 2/6 systolic murmur at the apex. Abdomen: Soft, non-tender, non-distended with normoactive bowel sounds. No hepatomegaly. No rebound/guarding. No obvious abdominal masses. Msk:  Strength and tone appears normal for age. Extremities: No clubbing, cyanosis or edema.  Distal pedal pulses are 2+ and equal bilaterally. No right groin hematoma. Neuro: Alert and oriented X 3. Moves all extremities spontaneously. Psych:  Responds to questions appropriately with a normal affect.  ASSESSMENT AND PLAN: 1. NSTEMI. Underlying LBBB. Pain free on IV heparin. On beta blocker and ASA. Cardiac cath showed severe disease in the proximal to mid LAD. The RCA is codominant and occluded in the mid vessel. EF 10-15%. Patient refuses to consider CABG. She is amenable to consideration of high risk PCI.Marland Kitchen Will plan PCI of the LAD with potential rotational atherectomy +/- Impella support today  with Dr. Angelena Form.  Loaded with Plavix. Patient is a DNR but is willing to suspend this for her procedure.  2. Acute systolic CHF. EF 15% by Echo. Diuresed well on IV lasix. I/O negative 4 L since admit. On low dose Coreg, lisinopril, and lasix. Consider further titration of meds post procedure.  3. Moderate MR. 4. Moderate pulmonary HTN by Echo but pulmonary pressures on right heart cath were normal. 5. Anemia.  6. LBBB  Present on Admission:  . Acute pulmonary edema . Elevated troponin . LBBB (left bundle branch block) . Asthma, persistent  controlled  Signed, Sharayah Renfrow [place], [place] 12/30/2013 8:24 AM

## 2013-12-30 NOTE — Progress Notes (Signed)
Patient doing well after Catherization.  BBS clear, Bipap nopt needed at this time.  RT will continue to monitor.

## 2013-12-30 NOTE — Interval H&P Note (Signed)
History and Physical Interval Note:  12/30/2013 11:53 AM  Beth Johnston  has presented today for cardiac cath/PCI of the LAD with Impella LV assist device placement. with the diagnosis of CAD, ischemic CM, systolic CHF. The various methods of treatment have been discussed with the patient and family. After consideration of risks, benefits and other options for treatment, the patient has consented to  Procedure(s): PERCUTANEOUS CORONARY STENT INTERVENTION (PCI-S) (N/A) as a surgical intervention .  The patient's history has been reviewed, patient examined, no change in status, stable for surgery.  I have reviewed the patient's chart and labs.  Questions were answered to the patient's satisfaction.    Cath Lab Visit (complete for each Cath Lab visit)  Clinical Evaluation Leading to the Procedure:   ACS: Yes.    Non-ACS:    Anginal Classification: CCS IV  Anti-ischemic medical therapy: Minimal Therapy (1 class of medications)  Non-Invasive Test Results: No non-invasive testing performed  Prior CABG: No previous CABG         MCALHANY,CHRISTOPHER

## 2013-12-30 NOTE — Progress Notes (Signed)
Site area:RFA/RFV  Site Prior to Removal:  Level 0 Pressure Applied For: 28min Manual:   yes Patient Status During Pull:  stable Post Pull Site:  Level 0 Post Pull Instructions Given: yes  Post Pull Pulses Present:  palpable Dressing Applied:  clear Bedrest begins @  1730 Comments:

## 2013-12-30 NOTE — Progress Notes (Signed)
ANTICOAGULATION CONSULT NOTE - Follow Up Consult  Pharmacy Consult for Heparin  Indication: chest pain/ACS  No Known Allergies  Patient Measurements: Height: 5\' 6"  (167.6 cm) Weight: 156 lb 8.4 oz (71 kg) IBW/kg (Calculated) : 59.3  Vital Signs: Temp: 97.6 F (36.4 C) (12/02 0736) Temp Source: Oral (12/02 0736) BP: 127/61 mmHg (12/02 0736) Pulse Rate: 69 (12/02 0736)  Labs:  Recent Labs  12/28/13 0255 12/29/13 0758 12/30/13 0400  HGB 11.4* 11.7* 10.9*  HCT 37.2 38.6 35.9*  PLT 189 184 167  LABPROT 13.9  --   --   INR 1.05  --   --   HEPARINUNFRC 0.41 0.37 0.30  CREATININE 0.75 0.68 0.70    Estimated Creatinine Clearance: 47.3 mL/min (by C-G formula based on Cr of 0.7).   Assessment: 80 yo F admitted on 12/25/2013 with SOB and mildly elevated trop, cath with 2V CAD, pt refusing CABG, pharmacy consulted to resume heparin pending PCI 12/2.    PMH: asthma, chronic bronchitis, arthritis  Anticoagulation: NSTEMI -- heparin level is at low end of goal, CBC stable, no bleeding noted.  Cardiovascular: LBBB, NSTEMI, new HFrEF (15%) -  trop elevated to 18.8. FLP 11/29: LDL 82, TG 87; wt increased, BP at goal, HR at goal On ASA, atorva 40, Coreg 6.25 po bid, lasix 40 daily, lisinopril 5  Nephrology: CrCl 45-50 ml/min, lytes wnl.    Pulmonary: Asthma on RA + Symbicort  PTA Medication Issues: home meds not ordered: flonase  Best Practices: DVT PX: hep gtt  Goal of Therapy:  Heparin level 0.3-0.7 units/ml Monitor platelets by anticoagulation protocol: Yes   Plan:  - Increase heparin gtt 1150 units/hr to keep it above goal - Daily heparin level and CBC - Monitor for s/sx of bleeding  Hassie Bruce, Pharm. D. Clinical Pharmacy Resident Pager: [phone] Ph: [phone] 12/30/2013 8:35 AM

## 2013-12-30 NOTE — H&P (View-Only) (Signed)
TELEMETRY: Reviewed telemetry pt in NSR: Filed Vitals:   12/29/13 2000 12/30/13 0000 12/30/13 0500 12/30/13 0736  BP: 138/98 135/64 125/58 127/61  Pulse: 97 87  69  Temp: 98 F (36.7 C)   97.6 F (36.4 C)  TempSrc: Oral   Oral  Resp:      Height:      Weight:   156 lb 8.4 oz (71 kg)   SpO2: 97% 98% 97% 97%    Intake/Output Summary (Last 24 hours) at 12/30/13 0824 Last data filed at 12/30/13 0800  Gross per 24 hour  Intake 1446.17 ml  Output      0 ml  Net 1446.17 ml   Filed Weights   12/28/13 0300 12/29/13 0500 12/30/13 0500  Weight: 152 lb 6.4 oz (69.128 kg) 155 lb 3.2 oz (70.398 kg) 156 lb 8.4 oz (71 kg)    Subjective Patient denies any chest pain or SOB. Up ambulating. Feels great- ready for her procedure.  Derrill Memo ON 12/31/2013] aspirin EC  81 mg Oral Daily  . atorvastatin  40 mg Oral q1800  . budesonide-formoterol  2 puff Inhalation BID  . carvedilol  6.25 mg Oral BID WC  . furosemide  40 mg Oral Daily  . lisinopril  5 mg Oral Daily  . sodium chloride  3 mL Intravenous Q12H   . sodium chloride 50 mL/hr (12/30/13 0543)  . heparin 1,100 Units/hr (12/29/13 2235)  . nitroGLYCERIN Stopped (12/29/13 0102)    LABS: Basic Metabolic Panel:  Recent Labs  12/29/13 0758 12/30/13 0400  NA 141 142  K 4.5 4.3  CL 105 105  CO2 23 25  GLUCOSE 102* 118*  BUN 17 17  CREATININE 0.68 0.70  CALCIUM 8.9 9.1  MG  --  2.0   Liver Function Tests: No results for input(s): AST, ALT, ALKPHOS, BILITOT, PROT, ALBUMIN in the last 72 hours. No results for input(s): LIPASE, AMYLASE in the last 72 hours. CBC:  Recent Labs  12/29/13 0758 12/30/13 0400  WBC 5.7 4.8  HGB 11.7* 10.9*  HCT 38.6 35.9*  MCV 79.6 79.6  PLT 184 167   Cardiac Enzymes: No results for input(s): CKTOTAL, CKMB, CKMBINDEX, TROPONINI in the last 72 hours. BNP: No results for input(s): PROBNP in the last 72 hours. D-Dimer: No results for input(s): DDIMER in the last 72 hours. Hemoglobin A1C: No  results for input(s): HGBA1C in the last 72 hours. Fasting Lipid Panel: No results for input(s): CHOL, HDL, LDLCALC, TRIG, CHOLHDL, LDLDIRECT in the last 72 hours. Thyroid Function Tests: No results for input(s): TSH, T4TOTAL, T3FREE, THYROIDAB in the last 72 hours.  Invalid input(s): FREET3   Radiology/Studies:  PORTABLE CHEST - 1 VIEW  COMPARISON: 05/11/2013.  FINDINGS: Enlarged cardiac silhouette without significant change. Increased prominence of the pulmonary vasculature and interstitial markings. Minimal right pleural effusion. Diffuse osteopenia. Marked bilateral shoulder degenerative changes.  IMPRESSION: Interval changes of acute congestive heart failure with stable cardiomegaly.   Electronically Signed By: Enrique Sack M.D. On: 12/25/2013 07:18  Ecg: NSR with LBBB. I have personally reviewed and interpreted this study.   Echo:Study Conclusions  - Left ventricle: The cavity size was normal. Wall thickness was increased in a pattern of moderate LVH. The estimated ejection fraction was 15%. Severe global hypokinesis with akinesis of the inferior, basal inferoseptal and apical walls. diastolic dysfunction (probably restrictive) with very high LV filling pressure. - Aortic valve: Poorly visualized. Mildly calcified leaflets. There was no stenosis. There was no regurgitation. -  Mitral valve: Calcified annulus. Mildly thickened leaflets . There was moderate regurgitation. - Left atrium: Moderately dilated at 44 ml/m2. - Right ventricle: The cavity size was normal. Wall thickness was normal. Systolic function was normal. Lateral annulus peak S velocity: 13.2 cm/s. - Right atrium: The atrium was normal in size. - Atrial septum: Aneurysmal interatrial septum. PFO cannot be excluded. - Tricuspid valve: There was moderate regurgitation. - Pulmonary arteries: PA peak pressure: 50 mm Hg (S).  Impressions:  - LVEF 15%, severe global  hypokinesis with inferior, inferoseptal and apical akinesis, moderate LVH, restrictive diastology wtih very high LV filling pressures, moderate MR and TR, moderately dilated LA, atrial septal aneurysm (cannot exclude PFO), moderate pulmonary hypertension - RVSP 50 mmHg.  RIGHT AND LEFT HEART CARDIAC CATHETERIZATION   HISTORY:  Beth Johnston is a 80 y.o. female who was admitted on 12/25/2013 with acute systolic heart failure and elevated troponin at 18. ECG showed left bundle branch block. Previously, the patient denied any significant history of any shortness of breath, although had a history of asthma. She ruled in for non-ST segment elevation MI. An echo Doppler study revealed an ejection fraction of 15% with severe global hypokinesis and akinesis of the inferior, basal, inferoseptal, and apical walls. She had restrictive physiology and moderate mitral regurgitation. There was moderate pulmonary hypertension with a PA estimated pressure at 50 mm. She is now referred for cardiac catheterization. With her history of an shortness of breath, marked LV dysfunction, and echo cardiographic evidence for pulmonary hypertension a right heart and left heart catheterization was performed after consent was obtained for this procedure.   PROCEDURE:   The patient was brought to the second floor [facility] cath lab in the postabsorptive state. Versed 2 mg and fentanyl 50 mcg were administered for conscious sedation. The right groin was prepped and draped in sterile fashion and a 5 [place] arterial sheath and 7 French venous sheath were inserted without difficulty. A Swan-Ganz catheter was advanced into the venous sheath and pressures were obtained in the right atrium, right ventricle, pulmonary artery, and pulmonary capillary wedge position. Cardiac outputs were obtained by the thermodilution and assumed Fick methods. Oxygen saturation was obtained in the pulmonary artery and aorta. A  pigtail catheter was inserted and simultaneous AO/PA pressures were recorded. The pigtail catheter was advanced into the left ventricle and simultaneous left ventricular and PCW pressures were recorded. Left ventriculography was performed in the RAO projection. A left ventricle to aorta pullback was performed. The pigtail catheter was then removed and diagnostic catheterization to delineate the coronary anatomy was performed utilizing 5 French Judkins 4 left and right diagnostic catheters. All catheters were removed and the patient. Hemostasis was obtained by direct manual pressure. The patient tolerated the procedure well and returned to his room in satisfactory condition.   HEMODYNAMICS:   RA: A wave 6; mean 5 RV: 30/ 5 PA: 30/14 PC: a 12 v 10; mean 10 initially;   LV: 134/19 PC: a 21 v 17;ean 18 AO: 133/56  Oxygen saturation in the aorta 98% and the pulmonary artery 66%  Cardiac output: 3.9 l/min (Thermo); 4.8 (Fick)  Cardiac index: 2.2 l/m/m2 2.7  ANGIOGRAPHY:   Left main: Angiographically normal and bifurcated into an LAD and left circumflex vessel.  LAD: Large caliber vessel which immediately gave rise to a proximal diagonal vessel. There was an 80% focal stenosis in the proximal portion of this diagonal 1 vessel. The LAD had diffuse stenoses of 80-90% proximally after  the Diagonal-1 vessel and then had another area of 80% stenosis proximal and distal to the second diagonal vessel with narrowing of 70-80% ostially in this second diagonal vessel. There were significant septal collateral supplying the distal RCA. The LAD extended to the LV apex.  Left circumflex: Large caliber vessel that gave rise to 2 major marginal branches and was free of significant obstructive disease.   Right coronary artery: Small to moderate size vessel that was subtotally/totally occluded after the takeoff of the first RV marginal branch and then was totally occluded after the acute  margin. There were moderate left to right collaterals originating predominantly from LAD septal perforating arteries.  Left ventriculography revealed a dilated left ventricle with severe global hypokinesis and an ejection fraction of 10-15%.   IMPRESSION:  Severe ischemic cardiomyopathy with an ejection fraction of 10-15%.  Severe two-vessel coronary obstructive disease with 80-90% diffuse proximal LAD stenoses with 80% focal stenosis in a large first diagonal vessel and 80% ostial stenosis of the second diagonal vessel, and total occlusion of the mid RCA proximal to the acute margin with predominant right to left collaterals evidence for normal left circumflex coronary artery.   RECOMMENDATION:  Ms. Dufresne denies any history of significant shortness of breath prior to her current presentation with acute systolic heart failure and non-ST segment elevation myocardial infarction. She has severe LV dysfunction and findings compatible with an ischemic cardiomyopathy. This may represent stunning versus hibernating myocardium due to her diffuse high-grade LAD and RCA disease with troponin elevation to 18. Her pulmonary pressures today have significantly improved from 50 mm on her echo Doppler study and today are 30 mm PA pressure on right heart catheterization. Options were discussed with the patient in detail concerning possible surgical evaluation and evaluation for viable myocardium. The patient has made up her mind that she does not want CABG revascularization. If percutaneous coronary intervention is to be performed this will be high risk and may require PCI with IABP/Impella for cardiac support during the intervention. The patient will be hydrated gently following her procedure. Further discussion with the patient and family will be made as well as with colleagues prior to final determination. Tentative plans will be for consideration of possible high risk PCI to the LAD in approximately 48  hours. The patient will be heparinized and started on IV nitroglycerin.   Troy Sine, MD, [facility] 12/28/2013 4:13 PM  PHYSICAL EXAM General: Well developed, elderly, in no acute distress. Head: Normocephalic, atraumatic, sclera non-icteric, oropharynx is clear Neck: Negative for carotid bruits. JVD not elevated. No adenopathy Lungs: Clear bilaterally to auscultation without wheezes, rales, or rhonchi. Breathing is unlabored. Heart: RRR S1 S2 without gallop. Grade 2/6 systolic murmur at the apex. Abdomen: Soft, non-tender, non-distended with normoactive bowel sounds. No hepatomegaly. No rebound/guarding. No obvious abdominal masses. Msk:  Strength and tone appears normal for age. Extremities: No clubbing, cyanosis or edema.  Distal pedal pulses are 2+ and equal bilaterally. No right groin hematoma. Neuro: Alert and oriented X 3. Moves all extremities spontaneously. Psych:  Responds to questions appropriately with a normal affect.  ASSESSMENT AND PLAN: 1. NSTEMI. Underlying LBBB. Pain free on IV heparin. On beta blocker and ASA. Cardiac cath showed severe disease in the proximal to mid LAD. The RCA is codominant and occluded in the mid vessel. EF 10-15%. Patient refuses to consider CABG. She is amenable to consideration of high risk PCI.Marland Kitchen Will plan PCI of the LAD with potential rotational atherectomy +/- Impella support today  with Dr. Angelena Form.  Loaded with Plavix. Patient is a DNR but is willing to suspend this for her procedure.  2. Acute systolic CHF. EF 15% by Echo. Diuresed well on IV lasix. I/O negative 4 L since admit. On low dose Coreg, lisinopril, and lasix. Consider further titration of meds post procedure.  3. Moderate MR. 4. Moderate pulmonary HTN by Echo but pulmonary pressures on right heart cath were normal. 5. Anemia.  6. LBBB  Present on Admission:  . Acute pulmonary edema . Elevated troponin . LBBB (left bundle branch block) . Asthma, persistent  controlled  Signed, Arlesia Kiel [place], [place] 12/30/2013 8:24 AM

## 2013-12-30 NOTE — Progress Notes (Signed)
Not coming back  to [place], family aware, belongings transferred to 2H03.

## 2013-12-30 NOTE — CV Procedure (Addendum)
Cardiac Catheterization Operative Report  Beth Johnston [id number] 12/2/20152:23 PM No PCP Per Patient  Procedure Performed:  1. Placement of Impella Cardiac Assist device 2. PTCA/DES x 2 proximal and mid LAD 3. Rotablator atherectomy proximal and mid LAD  4. Placement temporary transvenous pacemaker  Operator: Lauree Chandler, MD  Indication: 80 yo female admitted with CHF, NSTEMI. Diagnostic cath 12/28/13 per Dr. Claiborne Billings. She was found to have severe calcified disease in the proximal and mid LAD. She was not felt to be a good candidate for CABG. PCI of the LAD planned for today with Impella ventricular assist and rotablator atherectomy.                                         Procedure Details: The risks, benefits, complications, treatment options, and expected outcomes were discussed with the patient. The patient and/or family concurred with the proposed plan, giving informed consent. The patient was brought to the cath lab after IV hydration was begun and oral premedication was given. The patient was further sedated with Versed.  The right groin was prepped and draped in the usual manner. Using the modified Seldinger access technique, a 6 French sheath was placed in the right femoral artery. A 6 French sheath was placed in the right femoral vein. A temporary transvenous pacemaker was placed from the right femoral vein into the RV.  The left groin was prepped and draped in a sterile fashion. A micro-puncture needle was used to gain access into the left femoral artery. An angiogram was taken from the small micro-puncture sheath. A then placed a 14 Fr sheath in the left femoral artery. The Impella assist device was placed into the LV and output was set at 3.0 L/min. She was given a weight based bolus of Angiomax and a drip was started. When the ACT was over 200, I engaged the left main with a XB LAD 3.5 guiding catheter. I then passed a Rotafloppy wire down the LAD. A 1.5 rotablator  burr was passed down the LAD with 4 passes. I then upsized this to a 1.75 burr and made 3 passes down the LAD. A 2.5 x 15 mm [place] balloon was inflated 3 times in the proximal and mid LAD. A 2.75 x 38 mm Promus Premier DES was deployed in the mid LAD. A 3.0 x 16 mm Promus Premier DES was deployed in the proximal LAD. The distal stented segment was post-dilated with a 2.75 x 20 mm [place] balloon x 2. A 3.25 x 12 mm [place] balloon was used to post-dilate the proximal stented segment. The stenosis was taken from 95% down to 0%. The left groin had been double pre-closed with Perclose devices x 2. The Impella was removed and the sheath was removed from the left femoral artery. Both Perclose devices were deployed. Manual pressure was held on the groin post sheath pull.   There were no immediate complications. The patient was taken to the recovery area in stable condition.   Hemodynamic Findings: Central aortic pressure: 124/77 LV pressures measured with Impella (see cath lab report)  Impression: 1. NSTEMI, severe stenosis proximal and mid LAD with severe LV dysfunction 2. High risk PCI with rotablator atherectomy of the proximal and mid LAD, DES x 2 proximal and mid LAD 3. Severe LV systolic dysfunction, case supported by Impella LV assist device  Recommendations: She will be continued  on ASA and Plavix for at least one year.        Complications:  None; patient tolerated the procedure well.

## 2013-12-31 DIAGNOSIS — I5021 Acute systolic (congestive) heart failure: Secondary | ICD-10-CM

## 2013-12-31 LAB — BASIC METABOLIC PANEL
ANION GAP: 10 (ref 5–15)
BUN: 12 mg/dL (ref 6–23)
CHLORIDE: 105 meq/L (ref 96–112)
CO2: 25 mEq/L (ref 19–32)
Calcium: 8.7 mg/dL (ref 8.4–10.5)
Creatinine, Ser: 0.65 mg/dL (ref 0.50–1.10)
GFR, EST NON AFRICAN AMERICAN: 78 mL/min — AB (ref >90)
Glucose, Bld: 85 mg/dL (ref 70–99)
POTASSIUM: 4.2 meq/L (ref 3.7–5.3)
SODIUM: 140 meq/L (ref 137–147)

## 2013-12-31 LAB — CBC
HEMATOCRIT: 32.5 % — AB (ref 36.0–46.0)
Hemoglobin: 9.8 g/dL — ABNORMAL LOW (ref 12.0–15.0)
MCH: 24.1 pg — ABNORMAL LOW (ref 26.0–34.0)
MCHC: 30.2 g/dL (ref 30.0–36.0)
MCV: 79.9 fL (ref 78.0–100.0)
Platelets: 169 10*3/uL (ref 150–400)
RBC: 4.07 MIL/uL (ref 3.87–5.11)
RDW: 15.2 % (ref 11.5–15.5)
WBC: 5.7 10*3/uL (ref 4.0–10.5)

## 2013-12-31 MED ORDER — SPIRONOLACTONE 12.5 MG HALF TABLET
12.5000 mg | ORAL_TABLET | Freq: Every day | ORAL | Status: DC
  Administered 2013-12-31 – 2014-01-05 (×6): 12.5 mg via ORAL
  Filled 2013-12-31 (×6): qty 1

## 2013-12-31 MED ORDER — LISINOPRIL 10 MG PO TABS
10.0000 mg | ORAL_TABLET | Freq: Every day | ORAL | Status: DC
  Filled 2013-12-31: qty 1

## 2013-12-31 MED ORDER — LISINOPRIL 5 MG PO TABS
5.0000 mg | ORAL_TABLET | Freq: Every day | ORAL | Status: DC
  Administered 2013-12-31 – 2014-01-05 (×6): 5 mg via ORAL
  Filled 2013-12-31 (×5): qty 1

## 2013-12-31 MED FILL — Sodium Chloride IV Soln 0.9%: INTRAVENOUS | Qty: 50 | Status: AC

## 2013-12-31 NOTE — Progress Notes (Addendum)
CARDIAC REHAB PHASE I   PRE:  Rate/Rhythm: 70 SR  BP:  Supine:   Sitting: 129/47  Standing:    SaO2: 100 RA  MODE:  Ambulation: 390 ft   POST:  Rate/Rhythm: 80 SR  BP:  Supine:   Sitting: 110/38  Standing:    SaO2: 99 RA [id number] Assisted X 1 to ambulate. Gait steady VS stable. Pt back to chair after walk with call light in reach. Started MI and stent education with pt and son. We discussed Outpt. CRP, she agrees to referral to [facility] program. I gave pt MI booklet and we began discussing CHF.I left  heart healthy and low sodium diet sheets for her to begin to look through.Pt voices understanding.  Rodney Langton RN 12/31/2013 10:41 AM

## 2013-12-31 NOTE — Progress Notes (Signed)
TELEMETRY: Reviewed telemetry pt in NSR, 4 beat run of NSVT: Filed Vitals:   12/31/13 0400 12/31/13 0500 12/31/13 0600 12/31/13 0722  BP: 125/59 139/54 138/51   Pulse: 70 65 65 67  Temp:      TempSrc:      Resp:    16  Height:      Weight:  154 lb 6.4 oz (70.035 kg)    SpO2: 99% 98% 95% 98%    Intake/Output Summary (Last 24 hours) at 12/31/13 0753 Last data filed at 12/31/13 0100  Gross per 24 hour  Intake    284 ml  Output    900 ml  Net   -616 ml   Filed Weights   12/29/13 0500 12/30/13 0500 12/31/13 0500  Weight: 155 lb 3.2 oz (70.398 kg) 156 lb 8.4 oz (71 kg) 154 lb 6.4 oz (70.035 kg)    Subjective Patient denies any chest pain or SOB. Feels great. Some soreness in muscles from sheath pull.   Marland Kitchen aspirin EC  81 mg Oral Daily  . atorvastatin  40 mg Oral q1800  . budesonide-formoterol  2 puff Inhalation BID  . carvedilol  6.25 mg Oral BID WC  . clopidogrel  75 mg Oral Q breakfast  . furosemide  40 mg Oral Daily  . lisinopril  5 mg Oral Daily      LABS: Basic Metabolic Panel:  Recent Labs  12/30/13 0400 12/31/13 0250  NA 142 140  K 4.3 4.2  CL 105 105  CO2 25 25  GLUCOSE 118* 85  BUN 17 12  CREATININE 0.70 0.65  CALCIUM 9.1 8.7  MG 2.0  --    Liver Function Tests: No results for input(s): AST, ALT, ALKPHOS, BILITOT, PROT, ALBUMIN in the last 72 hours. No results for input(s): LIPASE, AMYLASE in the last 72 hours. CBC:  Recent Labs  12/30/13 0400 12/31/13 0250  WBC 4.8 5.7  HGB 10.9* 9.8*  HCT 35.9* 32.5*  MCV 79.6 79.9  PLT 167 169     Radiology/Studies:  PORTABLE CHEST - 1 VIEW  COMPARISON: 05/11/2013.  FINDINGS: Enlarged cardiac silhouette without significant change. Increased prominence of the pulmonary vasculature and interstitial markings. Minimal right pleural effusion. Diffuse osteopenia. Marked bilateral shoulder degenerative changes.  IMPRESSION: Interval changes of acute congestive heart failure with  stable cardiomegaly.   Electronically Signed By: Enrique Sack M.D. On: 12/25/2013 07:18  Ecg: NSR with LBBB. LAD. I have personally reviewed and interpreted this study.   Echo:Study Conclusions  - Left ventricle: The cavity size was normal. Wall thickness was increased in a pattern of moderate LVH. The estimated ejection fraction was 15%. Severe global hypokinesis with akinesis of the inferior, basal inferoseptal and apical walls. diastolic dysfunction (probably restrictive) with very high LV filling pressure. - Aortic valve: Poorly visualized. Mildly calcified leaflets. There was no stenosis. There was no regurgitation. - Mitral valve: Calcified annulus. Mildly thickened leaflets . There was moderate regurgitation. - Left atrium: Moderately dilated at 44 ml/m2. - Right ventricle: The cavity size was normal. Wall thickness was normal. Systolic function was normal. Lateral annulus peak S velocity: 13.2 cm/s. - Right atrium: The atrium was normal in size. - Atrial septum: Aneurysmal interatrial septum. PFO cannot be excluded. - Tricuspid valve: There was moderate regurgitation. - Pulmonary arteries: PA peak pressure: 50 mm Hg (S).  Impressions:  - LVEF 15%, severe global hypokinesis with inferior, inferoseptal and apical akinesis, moderate LVH, restrictive diastology wtih very high LV filling pressures, moderate  MR and TR, moderately dilated LA, atrial septal aneurysm (cannot exclude PFO), moderate pulmonary hypertension - RVSP 50 mmHg.  RIGHT AND LEFT HEART CARDIAC CATHETERIZATION   HISTORY:  Beth Johnston is a 80 y.o. female who was admitted on 12/25/2013 with acute systolic heart failure and elevated troponin at 18. ECG showed left bundle branch block. Previously, the patient denied any significant history of any shortness of breath, although had a history of asthma. She ruled in for non-ST segment elevation MI. An echo Doppler study  revealed an ejection fraction of 15% with severe global hypokinesis and akinesis of the inferior, basal, inferoseptal, and apical walls. She had restrictive physiology and moderate mitral regurgitation. There was moderate pulmonary hypertension with a PA estimated pressure at 50 mm. She is now referred for cardiac catheterization. With her history of an shortness of breath, marked LV dysfunction, and echo cardiographic evidence for pulmonary hypertension a right heart and left heart catheterization was performed after consent was obtained for this procedure.   PROCEDURE:   The patient was brought to the second floor [facility] cath lab in the postabsorptive state. Versed 2 mg and fentanyl 50 mcg were administered for conscious sedation. The right groin was prepped and draped in sterile fashion and a 5 [place] arterial sheath and 7 French venous sheath were inserted without difficulty. A Swan-Ganz catheter was advanced into the venous sheath and pressures were obtained in the right atrium, right ventricle, pulmonary artery, and pulmonary capillary wedge position. Cardiac outputs were obtained by the thermodilution and assumed Fick methods. Oxygen saturation was obtained in the pulmonary artery and aorta. A pigtail catheter was inserted and simultaneous AO/PA pressures were recorded. The pigtail catheter was advanced into the left ventricle and simultaneous left ventricular and PCW pressures were recorded. Left ventriculography was performed in the RAO projection. A left ventricle to aorta pullback was performed. The pigtail catheter was then removed and diagnostic catheterization to delineate the coronary anatomy was performed utilizing 5 French Judkins 4 left and right diagnostic catheters. All catheters were removed and the patient. Hemostasis was obtained by direct manual pressure. The patient tolerated the procedure well and returned to his room in satisfactory condition.   HEMODYNAMICS:    RA: A wave 6; mean 5 RV: 30/ 5 PA: 30/14 PC: a 12 v 10; mean 10 initially;   LV: 134/19 PC: a 21 v 17;ean 18 AO: 133/56  Oxygen saturation in the aorta 98% and the pulmonary artery 66%  Cardiac output: 3.9 l/min (Thermo); 4.8 (Fick)  Cardiac index: 2.2 l/m/m2 2.7  ANGIOGRAPHY:   Left main: Angiographically normal and bifurcated into an LAD and left circumflex vessel.  LAD: Large caliber vessel which immediately gave rise to a proximal diagonal vessel. There was an 80% focal stenosis in the proximal portion of this diagonal 1 vessel. The LAD had diffuse stenoses of 80-90% proximally after the Diagonal-1 vessel and then had another area of 80% stenosis proximal and distal to the second diagonal vessel with narrowing of 70-80% ostially in this second diagonal vessel. There were significant septal collateral supplying the distal RCA. The LAD extended to the LV apex.  Left circumflex: Large caliber vessel that gave rise to 2 major marginal branches and was free of significant obstructive disease.   Right coronary artery: Small to moderate size vessel that was subtotally/totally occluded after the takeoff of the first RV marginal branch and then was totally occluded after the acute margin. There were moderate left to right collaterals  originating predominantly from LAD septal perforating arteries.  Left ventriculography revealed a dilated left ventricle with severe global hypokinesis and an ejection fraction of 10-15%.   IMPRESSION:  Severe ischemic cardiomyopathy with an ejection fraction of 10-15%.  Severe two-vessel coronary obstructive disease with 80-90% diffuse proximal LAD stenoses with 80% focal stenosis in a large first diagonal vessel and 80% ostial stenosis of the second diagonal vessel, and total occlusion of the mid RCA proximal to the acute margin with predominant right to left collaterals evidence for normal left circumflex coronary  artery.   RECOMMENDATION:  Beth Johnston denies any history of significant shortness of breath prior to her current presentation with acute systolic heart failure and non-ST segment elevation myocardial infarction. She has severe LV dysfunction and findings compatible with an ischemic cardiomyopathy. This may represent stunning versus hibernating myocardium due to her diffuse high-grade LAD and RCA disease with troponin elevation to 18. Her pulmonary pressures today have significantly improved from 50 mm on her echo Doppler study and today are 30 mm PA pressure on right heart catheterization. Options were discussed with the patient in detail concerning possible surgical evaluation and evaluation for viable myocardium. The patient has made up her mind that she does not want CABG revascularization. If percutaneous coronary intervention is to be performed this will be high risk and may require PCI with IABP/Impella for cardiac support during the intervention. The patient will be hydrated gently following her procedure. Further discussion with the patient and family will be made as well as with colleagues prior to final determination. Tentative plans will be for consideration of possible high risk PCI to the LAD in approximately 48 hours. The patient will be heparinized and started on IV nitroglycerin.   Troy Sine, MD, [facility] 12/28/2013 4:13 PM  PHYSICAL EXAM General: Well developed, elderly, in no acute distress. Head: Normocephalic, atraumatic, sclera non-icteric, oropharynx is clear Neck: Negative for carotid bruits. JVD not elevated. No adenopathy Lungs: Clear bilaterally to auscultation without wheezes, rales, or rhonchi. Breathing is unlabored. Heart: RRR S1 S2 without gallop. Grade 2/6 systolic murmur at the apex. Abdomen: Soft, non-tender, non-distended with normoactive bowel sounds. No hepatomegaly. No rebound/guarding. No obvious abdominal masses. Msk:  Strength and tone appears  normal for age. Extremities: No clubbing, cyanosis or edema.  Distal pedal pulses are 2+ and equal bilaterally. No hematoma in bilateral groins.  Neuro: Alert and oriented X 3. Moves all extremities spontaneously. Psych:  Responds to questions appropriately with a normal affect.  ASSESSMENT AND PLAN: 1. NSTEMI. Underlying LBBB. Pain free on IV heparin. On beta blocker, Plavix,  ASA. Cardiac cath showed severe disease in the proximal to mid LAD. The RCA is codominant and occluded in the mid vessel. EF 10-15%. S/p PCI of the LAD yesterday with rotational atherectomy and stenting x 2 with DES. Impella support. Very nice result. Will transfer patient to telemetry today. Ambulate with cardiac Rehab.  2. Acute systolic CHF. EF 15% by Echo. On low dose Coreg, lisinopril, and lasix. Will increase lisinopril today. Start low dose aldactone.  3. Moderate MR. 4. Moderate pulmonary HTN by Echo but pulmonary pressures on right heart cath were normal. 5. Anemia. Mild drop in Hbg as expected post PCI.  6. LBBB  Present on Admission:  . Acute pulmonary edema . Elevated troponin . LBBB (left bundle branch block) . Asthma, persistent controlled  Signed, Peter Martinique, [place] 12/31/2013 7:53 AM

## 2013-12-31 NOTE — Plan of Care (Signed)
Problem: Phase II Progression Outcomes Goal: Begin discharge teaching Outcome: Progressing Heart failure packet and medication information sheets discussed with patient and granddaughter. Goal: Other Phase II Outcomes/Goals Outcome: Not Applicable Date Met:  02/40/97  Problem: Phase III Progression Outcomes Goal: Pain controlled on oral analgesia Outcome: Completed/Met Date Met:  12/31/13 Goal: Tolerating diet Outcome: Completed/Met Date Met:  12/31/13

## 2013-12-31 NOTE — Progress Notes (Signed)
Patient transferred from [facility] today. Skin warm and dry. Oral mucosa pink and moist. No sob noted. Denies pain and discomfort. Son was with patient when transferred to this floor. Patient is independent.

## 2014-01-01 ENCOUNTER — Telehealth: Payer: Self-pay | Admitting: Cardiovascular Disease

## 2014-01-01 ENCOUNTER — Inpatient Hospital Stay (HOSPITAL_COMMUNITY): Payer: Medicare Other

## 2014-01-01 ENCOUNTER — Encounter (HOSPITAL_COMMUNITY): Payer: Self-pay | Admitting: *Deleted

## 2014-01-01 DIAGNOSIS — M79609 Pain in unspecified limb: Secondary | ICD-10-CM

## 2014-01-01 LAB — CBC
HEMATOCRIT: 28.3 % — AB (ref 36.0–46.0)
HEMOGLOBIN: 8.8 g/dL — AB (ref 12.0–15.0)
MCH: 24.6 pg — AB (ref 26.0–34.0)
MCHC: 31.1 g/dL (ref 30.0–36.0)
MCV: 79.3 fL (ref 78.0–100.0)
Platelets: 153 10*3/uL (ref 150–400)
RBC: 3.57 MIL/uL — ABNORMAL LOW (ref 3.87–5.11)
RDW: 15 % (ref 11.5–15.5)
WBC: 6.2 10*3/uL (ref 4.0–10.5)

## 2014-01-01 MED ORDER — HYDROCODONE-ACETAMINOPHEN 5-325 MG PO TABS
1.0000 | ORAL_TABLET | ORAL | Status: DC | PRN
  Administered 2014-01-01 – 2014-01-02 (×2): 1 via ORAL
  Administered 2014-01-03: 2 via ORAL
  Filled 2014-01-01 (×2): qty 2

## 2014-01-01 MED ORDER — HYDROCODONE-ACETAMINOPHEN 5-325 MG PO TABS
ORAL_TABLET | ORAL | Status: AC
  Filled 2014-01-01: qty 1

## 2014-01-01 NOTE — Telephone Encounter (Signed)
New message    Daughter calling stating her mother still in the hospital room 3-26. Aware that Dr. Angelena Form is in clinic today.    C/O leg swollen . Her grand-daughter is with her now.    Grand-daughter notify the nurse of her leg . Wants Dr. Angelena Form to go around to see her

## 2014-01-01 NOTE — Progress Notes (Signed)
CARDIAC REHAB PHASE I   Came to ambulate pt however family and pt st she is unable to lift her right leg today. St she started with some pain in thigh yesterday afternoon but was able to walk to BR, etc. However today upon waking she cannot lift it in the bed. Swollen, hard. Began discussing HF. Will hold ambulation and f/u for plan. [phone]  Josephina Shih [place] CES, ACSM 01/01/2014 10:46 AM

## 2014-01-01 NOTE — Progress Notes (Signed)
Dr. Mare Ferrari notified as patient and family had not been updated regarding patient's CT results.  Per Dr. Mare Ferrari, keep patient on strict bed rest.  Patient and family updated and agreeable.  Dr. Mare Ferrari at bedside.  Will continue to monitor.

## 2014-01-01 NOTE — Progress Notes (Signed)
I was called by nurse concerning the pseudoaneurysm.  The patient is having minimal discomfort in her right leg below the groin into the upper thigh.  There is no significant ecchymosis or discoloration.  She is only minimally tender on palpation of the groin itself.  I do not hear a systolic bruit.  We will keep her at bedrest tonight and reassess tomorrow.  We will work with the vascular techs as to whether this would be best handled with prolonged compression tomorrow or by injection of thrombin by Dr. Burt Knack on Monday.

## 2014-01-01 NOTE — Telephone Encounter (Signed)
Spoke with Trish at hospital. She will make hospital team aware.

## 2014-01-01 NOTE — Progress Notes (Signed)
Patient Name: Beth Johnston Date of Encounter: 01/01/2014  Principal Problem:   Acute pulmonary edema Active Problems:   Asthma, persistent controlled   LBBB (left bundle branch block)   NSTEMI (non-ST elevated myocardial infarction)   Mitral regurgitation   Cardiomyopathy, ischemic   Acute systolic CHF (congestive heart failure)   Primary Cardiologist: Dr. Debara Pickett  Patient Profile: 80 yo female w/ asthma, LBBB, was admitted 11/27 with pulm edema, ?MI, cath w/ EF 10-15%, 90% LAD, D1 80%, 80% D2, RCA 100%, staged PCI 12/02 with temp pacer and Impella. Right leg soreness started after cath, worse 12/04.  SUBJECTIVE: No chest pain, no shortness of breath. However, in the last 24 hours, her right groin and thigh have become swollen, tender and the pain is progressed to the point where she cannot lift her leg and is having significant difficulty with ambulation. Of note, the Impella was on the left side.  OBJECTIVE Filed Vitals:   12/31/13 2118 01/01/14 0218 01/01/14 0558 01/01/14 0820  BP:  104/47 122/52 125/55  Pulse: 71 66 73 70  Temp:  98.6 F (37 C) 98 F (36.7 C) 97.8 F (36.6 C)  TempSrc:  Oral Oral Oral  Resp: 16 18 17 18   Height:      Weight:   154 lb 12.2 oz (70.2 kg)   SpO2: 96% 95% 100% 98%    Intake/Output Summary (Last 24 hours) at 01/01/14 1040 Last data filed at 01/01/14 0951  Gross per 24 hour  Intake   1720 ml  Output   2025 ml  Net   -305 ml   Filed Weights   12/31/13 0500 12/31/13 1053 01/01/14 0558  Weight: 154 lb 6.4 oz (70.035 kg) 156 lb 9.6 oz (71.033 kg) 154 lb 12.2 oz (70.2 kg)    PHYSICAL EXAM General: Well developed, well nourished, female in no acute distress. Head: Normocephalic, atraumatic.  Neck: Supple without bruits, JVD not elevated. Lungs:  Resp regular and unlabored, few Rales bases. Heart: RRR, S1, S2, no S3, S4, soft murmur; soft rub. Abdomen: Soft, non-tender, non-distended, BS + x 4.  Extremities: No clubbing,  cyanosis, no edema. Right upper thigh/groin area is very firm to touch, large hematoma palpable Neuro: Alert and oriented X 3. Moves all extremities spontaneously. Psych: Normal affect.  LABS: CBC:  Recent Labs  12/30/13 0400 12/31/13 0250  WBC 4.8 5.7  HGB 10.9* 9.8*  HCT 35.9* 32.5*  MCV 79.6 79.9  PLT 167 732   Basic Metabolic Panel:  Recent Labs  12/30/13 0400 12/31/13 0250  NA 142 140  K 4.3 4.2  CL 105 105  CO2 25 25  GLUCOSE 118* 85  BUN 17 12  CREATININE 0.70 0.65  CALCIUM 9.1 8.7  MG 2.0  --    BNP: PRO B NATRIURETIC PEPTIDE (BNP)  Date/Time Value Ref Range Status  12/26/2013 12:30 AM 3271.0* 0 - 450 pg/mL Final  12/25/2013 06:09 AM 1516.0* 0 - 450 pg/mL Final   TELE:  Sinus rhythm with PVCs      12/02 cardiac catheterization  Procedure Performed:  1. Placement of Impella Cardiac Assist device 2. PTCA/DES x 2 proximal and mid LAD 3. Rotablator atherectomy proximal and mid LAD  4. Placement temporary transvenous pacemaker  Echocardiogram: 12/25/2013 Conclusions - Left ventricle: The cavity size was normal. Wall thickness was increased in a pattern of moderate LVH. The estimated ejection fraction was 15%. Severe global hypokinesis with akinesis of the inferior, basal inferoseptal and apical  walls. diastolic dysfunction (probably restrictive) with very high LV filling pressure. - Aortic valve: Poorly visualized. Mildly calcified leaflets. There was no stenosis. There was no regurgitation. - Mitral valve: Calcified annulus. Mildly thickened leaflets . There was moderate regurgitation. - Left atrium: Moderately dilated at 44 ml/m2. - Right ventricle: The cavity size was normal. Wall thickness was normal. Systolic function was normal. Lateral annulus peak S velocity: 13.2 cm/s. - Right atrium: The atrium was normal in size. - Atrial septum: Aneurysmal interatrial septum. PFO cannot be excluded. - Tricuspid valve: There was  moderate regurgitation. - Pulmonary arteries: PA peak pressure: 50 mm Hg (S). Impressions: - LVEF 15%, severe global hypokinesis with inferior, inferoseptal and apical akinesis, moderate LVH, restrictive diastology wtih very high LV filling pressures, moderate MR and TR, moderately dilated LA, atrial septal aneurysm (cannot exclude PFO), moderate pulmonary hypertension - RVSP 50 mmHg.  Current Medications:  . aspirin EC  81 mg Oral Daily  . atorvastatin  40 mg Oral q1800  . budesonide-formoterol  2 puff Inhalation BID  . carvedilol  6.25 mg Oral BID WC  . clopidogrel  75 mg Oral Q breakfast  . furosemide  40 mg Oral Daily  . lisinopril  5 mg Oral Daily  . spironolactone  12.5 mg Oral Daily      ASSESSMENT AND PLAN: Principal Problem:   Acute pulmonary edema - volume status appears good on Lasix 40 mg by mouth daily and spironolactone 12.5.  Active Problems:   Asthma, persistent controlled - no wheezing on exam, on home inhaler    LBBB (left bundle branch block) - chronic    NSTEMI (non-ST elevated myocardial infarction) - may have been the trigger for the pulmonary edema. She is S/P intervention to the LAD, on aspirin, statin, beta blocker, Plavix, ACE inhibitor    Mitral regurgitation - seen on echocardiogram, results above    Cardiomyopathy, ischemic - echocardiogram results above, on Ace, beta blocker and spironolactone    Acute systolic CHF (congestive heart failure) - volume status currently appears good    Anemia - possibly secondary to acute blood loss, recheck CBC now and continue to follow    Possible right groin hematoma - hematoma is larger limiting leg movement, will check abdominal/pelvic CT and follow-up on results.  Signed, Rosaria Ferries , PA-C 10:40 AM 01/01/2014 Patient seen and examined.  I have amended note abovle to reflect my findings Plan on USN of groin to evaluate r/o pseudoaneurysm  No retroperitoneal hematoma on CT  Getting closer to  [place]

## 2014-01-01 NOTE — Progress Notes (Signed)
Paged Beth Johnston with results of ultrasound. Called back, results given about positive pseudoaneurysm.

## 2014-01-01 NOTE — Progress Notes (Signed)
Patient's legs "sore", rating 5/10 at beginning of shift.  Patient states legs "have been sore since my surgeries".  Bilateral femoral sites rated level 1 as both are bruised.  Administered Tylenol to good effect.  Will continue to monitor.

## 2014-01-01 NOTE — Progress Notes (Signed)
Pt complaining of soreness to her right leg. Soreness is starting at the top of her leg, and goes down to the top of her knee. Patient is now having trouble with ambulating due to the soreness. No swelling noted, however leg does feel somewhat warm. Her groin sites are level 1 with bruising noted. Soreness is rated at 10/10 at this point. Beth Johnston notified.

## 2014-01-01 NOTE — Progress Notes (Signed)
CSW received a nursing referral in Progression Rounds this morning that patient required transportation resources as she will be receiving Cardiac Rehab in the near future. CSW met with patient and her son Coralyn Mark this morning to discuss. Both patient and son Coralyn Mark were adamant that patient does not have any transportation needs. She normally drives herself and if she were unable to drive- her son and her daughter Al Corpus live very close to her and will take her anywhere she needs to go. Patient relates that she has never been concerned with transportation.  She is, however, concerned that she is unable to lift her right leg (s/p heart cath) and this is causing her a great deal of distress. She states that nursing and MD are aware.  She denies any past problems/issues with walking and stated that she would not have any interest in any type of SNF rehab- even if recommended by PT. Son Coralyn Mark concurred with this and stated that they would allow home health to come out if needed. CSW relayed information to [place]. CSW signing off.  Lorie Phenix. Pauline Good, [place]

## 2014-01-01 NOTE — Progress Notes (Addendum)
*  PRELIMINARY RESULTS* Vascular Ultrasound Right groin evaluation for Pseudoaneurysm has been completed.  Preliminary findings: Positive for pseudo measuring 75mm with a neck measuring 59mm.  Paged Dr. Harrington Challenger at [facility] with results, she will let MD know.     Landry Mellow, RDMS, RVT  01/01/2014, 3:43 PM

## 2014-01-01 NOTE — Discharge Planning (Signed)
Medicare Important Message given? YES  (If response is "NO", the following Medicare IM given date fields will be blank)  Date Medicare IM given: 01/01/2014 Medicare IM given by: Ellan Lambert, RN, BSN   [phone]            Pt adm on 12/25/13 with NSTEMI.  Prior to admission, pt lives at home alone, and is independent.  Son at bedside is supportive, and assists with transporting pt to doctor's appointments.  Will continue to follow for discharge needs as pt progresses.  May benefit from PT consult when able to tolerate medically, as she is complaining of difficulty lifting right leg.  MD aware of this.  Will follow progress.    Lionel December, RN, BSN Phone [phone]

## 2014-01-01 NOTE — Telephone Encounter (Signed)
thanks

## 2014-01-02 DIAGNOSIS — T81718A Complication of other artery following a procedure, not elsewhere classified, initial encounter: Secondary | ICD-10-CM

## 2014-01-02 DIAGNOSIS — Z9861 Coronary angioplasty status: Secondary | ICD-10-CM

## 2014-01-02 DIAGNOSIS — I2510 Atherosclerotic heart disease of native coronary artery without angina pectoris: Secondary | ICD-10-CM

## 2014-01-02 DIAGNOSIS — I729 Aneurysm of unspecified site: Secondary | ICD-10-CM | POA: Diagnosis not present

## 2014-01-02 DIAGNOSIS — I998 Other disorder of circulatory system: Secondary | ICD-10-CM

## 2014-01-02 DIAGNOSIS — J9600 Acute respiratory failure, unspecified whether with hypoxia or hypercapnia: Secondary | ICD-10-CM | POA: Insufficient documentation

## 2014-01-02 DIAGNOSIS — D500 Iron deficiency anemia secondary to blood loss (chronic): Secondary | ICD-10-CM

## 2014-01-02 HISTORY — DX: Atherosclerotic heart disease of native coronary artery without angina pectoris: I25.10

## 2014-01-02 LAB — CBC
HCT: 26.2 % — ABNORMAL LOW (ref 36.0–46.0)
Hemoglobin: 8 g/dL — ABNORMAL LOW (ref 12.0–15.0)
MCH: 24.4 pg — AB (ref 26.0–34.0)
MCHC: 30.5 g/dL (ref 30.0–36.0)
MCV: 79.9 fL (ref 78.0–100.0)
PLATELETS: 143 10*3/uL — AB (ref 150–400)
RBC: 3.28 MIL/uL — ABNORMAL LOW (ref 3.87–5.11)
RDW: 14.9 % (ref 11.5–15.5)
WBC: 6.9 10*3/uL (ref 4.0–10.5)

## 2014-01-02 LAB — BASIC METABOLIC PANEL
ANION GAP: 10 (ref 5–15)
BUN: 17 mg/dL (ref 6–23)
CALCIUM: 8.7 mg/dL (ref 8.4–10.5)
CO2: 25 mEq/L (ref 19–32)
Chloride: 104 mEq/L (ref 96–112)
Creatinine, Ser: 0.7 mg/dL (ref 0.50–1.10)
GFR calc Af Amer: 88 mL/min — ABNORMAL LOW (ref >90)
GFR, EST NON AFRICAN AMERICAN: 76 mL/min — AB (ref >90)
Glucose, Bld: 101 mg/dL — ABNORMAL HIGH (ref 70–99)
Potassium: 4.6 mEq/L (ref 3.7–5.3)
SODIUM: 139 meq/L (ref 137–147)

## 2014-01-02 LAB — HEMOGLOBIN AND HEMATOCRIT, BLOOD
HCT: 27.7 % — ABNORMAL LOW (ref 36.0–46.0)
Hemoglobin: 8.5 g/dL — ABNORMAL LOW (ref 12.0–15.0)

## 2014-01-02 NOTE — Plan of Care (Signed)
Problem: Phase I Progression Outcomes Goal: Anginal pain relieved Outcome: Completed/Met Date Met:  01/02/14 Goal: Aspirin unless contraindicated Outcome: Completed/Met Date Met:  01/02/14 Goal: Voiding-avoid urinary catheter unless indicated Outcome: Completed/Met Date Met:  01/02/14 Goal: Hemodynamically stable Outcome: Progressing Goal: Vascular site scale level 0 - I Vascular Site Scale Level 0: No bruising/bleeding/hematoma Level I (Mild): Bruising/Ecchymosis, minimal bleeding/ooozing, palpable hematoma < 3 cm Level II (Moderate): Bleeding not affecting hemodynamic parameters, pseudoaneurysm, palpable hematoma > 3 cm Level III (Severe) Bleeding which affects hemodynamic parameters or retroperitoneal hemorrhage  Outcome: Progressing Goal: Initial discharge plan identified Outcome: Progressing Goal: Other Phase I Outcomes/Goals Outcome: Not Applicable Date Met:  28/24/17

## 2014-01-02 NOTE — Progress Notes (Signed)
I spoke with the vascular tech. Pseudoaneurysm essentially unchanged.  The are not allowed to do compressions on the weekends because of staffing issues. In her opinion this is a small to medium pseudoaneurysm and it may close on its own. For now continue bedrest, follow Hgb, and re evaluate Monday.   Kerin Ransom PA-C 01/02/2014 12:51 PM

## 2014-01-02 NOTE — Progress Notes (Signed)
VASCULAR LAB PRELIMINARY  PRELIMINARY  PRELIMINARY  PRELIMINARY  Right groin ultrasound to reevaluate psuedoaneurysm completed.    Preliminary report:  Pseudoaneurysm unchanged from study done 01/01/14.  Maricus Tanzi, RVT 01/02/2014, 12:44 PM

## 2014-01-02 NOTE — Plan of Care (Signed)
Problem: Phase II Progression Outcomes Goal: Anginal pain absent Outcome: Completed/Met Date Met:  01/02/14 Goal: Wean IV nitroglycerin to PO or topical Outcome: Not Applicable Date Met:  01/02/14 Goal: Tolerating diet Outcome: Completed/Met Date Met:  01/02/14 Goal: Other Phase II Outcomes/Goals Outcome: Not Applicable Date Met:  01/02/14     

## 2014-01-02 NOTE — Progress Notes (Signed)
    Subjective:  No acute distress, pain when moving her Rt leg.  Objective:  Vital Signs in the last 24 hours: Temp:  [97.8 F (36.6 C)-99 F (37.2 C)] 99 F (37.2 C) (12/05 3300) Pulse Rate:  [67-79] 74 (12/05 0608) Resp:  [16-18] 16 (12/05 0608) BP: (93-125)/(42-55) 106/49 mmHg (12/05 0608) SpO2:  [97 %-99 %] 97 % (12/05 7622) Weight:  [156 lb (70.761 kg)] 156 lb (70.761 kg) (12/05 6333)  Intake/Output from previous day:  Intake/Output Summary (Last 24 hours) at 01/02/14 0817 Last data filed at 01/02/14 0725  Gross per 24 hour  Intake    940 ml  Output    650 ml  Net    290 ml    Physical Exam: General appearance: alert, cooperative, no distress and pale Lungs: clear to auscultation bilaterally Heart: regular rate and rhythm Extremities: Rt groin site with some ecchymosis, no bruit   Rate: 74  Rhythm: normal sinus rhythm and LBBB  Lab Results:  Recent Labs  01/01/14 1109 01/02/14 0458  WBC 6.2 6.9  HGB 8.8* 8.0*  PLT 153 143*    Recent Labs  12/31/13 0250 01/02/14 0458  NA 140 139  K 4.2 4.6  CL 105 104  CO2 25 25  GLUCOSE 85 101*  BUN 12 17  CREATININE 0.65 0.70   No results for input(s): TROPONINI in the last 72 hours.  Invalid input(s): CK, MB No results for input(s): INR in the last 72 hours.  Imaging: Imaging results have been reviewed  Cardiac Studies: Duplex 01/01/14 Positive for pseudo measuring 30mm with a neck measuring 54mm.  Assessment/Plan:  80 yo female w/ asthma, LBBB, was admitted 11/27 with pulm edema and NSTEMI. Cath w/ EF 10-15%, 90% LAD, D1 80%, 80% D2, RCA 100%. She declined CABG and underwent high PCI staged PCI/DES 12/02 to her LAD. Post PCI she developed Rt groin pseudoaneurysm.    Principal Problem:   Acute respiratory failure  Active Problems:   NSTEMI (non-ST elevated myocardial infarction)   Acute systolic CHF (congestive heart failure)   CAD S/P high risk LAD PCI/DES 12/30/13   Cardiomyopathy, ischemic-EF  15%-pre PCI   Pseudoaneurysm following PCI   Blood loss anemia   Asthma, persistent controlled   LBBB (left bundle branch block)   Mitral regurgitation   PLAN: Will review Duplex with MD- ? Repeat this am- ? Compress vs observe. Check f/u Hgb this pm. Hold meds for low B/P- parameters written.   Kerin Ransom PA-C Beeper [phone] 01/02/2014, 8:17 AM

## 2014-01-03 LAB — CBC
HCT: 27.8 % — ABNORMAL LOW (ref 36.0–46.0)
Hemoglobin: 8.5 g/dL — ABNORMAL LOW (ref 12.0–15.0)
MCH: 24.2 pg — ABNORMAL LOW (ref 26.0–34.0)
MCHC: 30.6 g/dL (ref 30.0–36.0)
MCV: 79.2 fL (ref 78.0–100.0)
Platelets: 171 10*3/uL (ref 150–400)
RBC: 3.51 MIL/uL — ABNORMAL LOW (ref 3.87–5.11)
RDW: 14.8 % (ref 11.5–15.5)
WBC: 7 10*3/uL (ref 4.0–10.5)

## 2014-01-03 MED ORDER — DOCUSATE SODIUM 100 MG PO CAPS
100.0000 mg | ORAL_CAPSULE | Freq: Every day | ORAL | Status: DC
  Administered 2014-01-03 – 2014-01-05 (×3): 100 mg via ORAL
  Filled 2014-01-03 (×3): qty 1

## 2014-01-03 MED ORDER — POLYETHYLENE GLYCOL 3350 17 G PO PACK
17.0000 g | PACK | Freq: Every day | ORAL | Status: DC
  Administered 2014-01-03 – 2014-01-05 (×3): 17 g via ORAL
  Filled 2014-01-03 (×3): qty 1

## 2014-01-03 NOTE — Progress Notes (Signed)
Pt remains on bedrest pain and swelling to right leg decreased from yesterday. Pt. Feeling anxious about her situation, informed patient and daughter of plans of care for now. Will cont. To monitor and support.

## 2014-01-03 NOTE — Progress Notes (Signed)
The patient states less pain in her groin area this morning.  She received Norco x1 at 2200 last night and was able to sleep the majority of the night.  She reports a worsening cough and a few rhonchi can be heard in her upper lung lobes.  The RT gave her an incentive spirometer and she was encouraged to use it.  Her daughter is at the bedside.

## 2014-01-03 NOTE — Progress Notes (Signed)
Subjective: She is feeling much better.  She is able to move her leg without pain.  No CP or orthopnea.  Objective: Vital signs in last 24 hours: Temp:  [97.5 F (36.4 C)-98.6 F (37 C)] 97.5 F (36.4 C) (12/06 0457) Pulse Rate:  [75-83] 83 (12/06 0457) Resp:  [18] 18 (12/06 0457) BP: (103-112)/(46-67) 103/67 mmHg (12/06 0457) SpO2:  [94 %-98 %] 95 % (12/06 0935) Weight:  [153 lb 4.8 oz (69.536 kg)] 153 lb 4.8 oz (69.536 kg) (12/06 0457) Last BM Date: 01/01/14  Intake/Output from previous day: 12/05 0701 - 12/06 0700 In: 1280 [P.O.:1280] Out: 2100 [Urine:2100] Intake/Output this shift: Total I/O In: 240 [P.O.:240] Out: -   Medications Current Facility-Administered Medications  Medication Dose Route Frequency Provider Last Rate Last Dose  . acetaminophen (TYLENOL) tablet 650 mg  650 mg Oral Q4H PRN Troy Sine, MD   650 mg at 01/01/14 0856  . aspirin EC tablet 81 mg  81 mg Oral Daily Pixie Casino, MD   81 mg at 01/02/14 1110  . atorvastatin (LIPITOR) tablet 40 mg  40 mg Oral q1800 Satira Sark, MD   40 mg at 01/02/14 1826  . budesonide-formoterol (SYMBICORT) 160-4.5 MCG/ACT inhaler 2 puff  2 puff Inhalation BID Pixie Casino, MD   2 puff at 01/03/14 0935  . carvedilol (COREG) tablet 6.25 mg  6.25 mg Oral BID WC Doreene Burke Kilroy, PA-C   6.25 mg at 01/02/14 1826  . clopidogrel (PLAVIX) tablet 75 mg  75 mg Oral Q breakfast Burnell Blanks, MD   75 mg at 01/02/14 1110  . furosemide (LASIX) tablet 40 mg  40 mg Oral Daily Erlene Quan, PA-C   40 mg at 01/02/14 1110  . HYDROcodone-acetaminophen (NORCO/VICODIN) 5-325 MG per tablet 1-2 tablet  1-2 tablet Oral Q4H PRN Lonn Georgia, PA-C   1 tablet at 01/02/14 2214  . levalbuterol (XOPENEX) nebulizer solution 0.63 mg  0.63 mg Nebulization Q6H PRN Pixie Casino, MD      . lisinopril (PRINIVIL,ZESTRIL) tablet 5 mg  5 mg Oral Daily Erlene Quan, PA-C   5 mg at 01/02/14 1110  . ondansetron (ZOFRAN) injection 4  mg  4 mg Intravenous Q6H PRN Troy Sine, MD      . spironolactone (ALDACTONE) tablet 12.5 mg  12.5 mg Oral Daily Luke K Kilroy, PA-C   12.5 mg at 01/02/14 1110    PE: General appearance: alert, cooperative and no distress Lungs: clear to auscultation bilaterally Heart: regular rate and rhythm and harsh raspy 2/6 sys MM at Apex Extremities: No LEE Pulses: 2+ and symmetric Skin: Warm and dry.  no bruit in the right groin.   Neurologic: Grossly normal  Lab Results:   Recent Labs  01/01/14 1109 01/02/14 0458 01/02/14 1423 01/03/14 0535  WBC 6.2 6.9  --  7.0  HGB 8.8* 8.0* 8.5* 8.5*  HCT 28.3* 26.2* 27.7* 27.8*  PLT 153 143*  --  171   BMET  Recent Labs  01/02/14 0458  NA 139  K 4.6  CL 104  CO2 25  GLUCOSE 101*  BUN 17  CREATININE 0.70  CALCIUM 8.7    Assessment/Plan  80 yo female w/ asthma, LBBB, was admitted 11/27 with pulm edema, ?MI, cath w/ EF 10-15%, 90% LAD, D1 80%, 80% D2, RCA 100%, staged PCI 12/02 with temp pacer and Impella. Right leg soreness started after cath, worse 12/04.  Principal Problem:   Acute  respiratory failure Active Problems:   Asthma, persistent controlled   LBBB (left bundle branch block)   NSTEMI (non-ST elevated myocardial infarction)   Mitral regurgitation   Cardiomyopathy, ischemic-EF 15%-pre PCI   Acute systolic CHF (congestive heart failure)   CAD S/P high risk LAD PCI/DES 12/30/13   Pseudoaneurysm following PCI   Blood loss anemia  Plan:   Cath 12/2: NSTEMI, severe stenosis proximal and mid LAD with severe LV dysfunction. High risk PCI with rotablator atherectomy of the proximal and mid LAD, DES x 2 proximal and mid LAD.  Severe LV systolic dysfunction, case supported by Impella LV assist device.    ASA, plavix, coreg 6.25 BID  Small to medium pseudoaneurysm.  Did not compress yesterday due to staffing issue with vascular on the weekends.   Hgb stable at 8.5 but was 10.9 on 12/2.  Improved a lot since yesterday.  Ok to get  up to the bathroom today.  Let's hold off on ambulation in the hall until we recheck the pseudo tomorrow morning.    BP and HR stable.  Not one alarm on tele.    EF 15% by echo.  Net fluids: -0.8L/-4.0L.  appears euvolemic.  Lasix 40mg  daily, aldactone 12.5 daily, lisinopril 5 daily.  Daily weight monitoring discussed.  Will need follow up echo in 2-3 months.  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  She cleans buses!  Cardiac rehab.   LOS: 9 days    Kayleana Waites PA-C 01/03/2014 9:37 AM

## 2014-01-03 NOTE — Plan of Care (Signed)
Problem: Phase I Progression Outcomes Goal: Hemodynamically stable Outcome: Completed/Met Date Met:  01/03/14     

## 2014-01-04 ENCOUNTER — Encounter (HOSPITAL_COMMUNITY): Payer: Self-pay | Admitting: Physician Assistant

## 2014-01-04 DIAGNOSIS — R778 Other specified abnormalities of plasma proteins: Secondary | ICD-10-CM | POA: Insufficient documentation

## 2014-01-04 DIAGNOSIS — J9600 Acute respiratory failure, unspecified whether with hypoxia or hypercapnia: Secondary | ICD-10-CM

## 2014-01-04 DIAGNOSIS — I509 Heart failure, unspecified: Secondary | ICD-10-CM | POA: Insufficient documentation

## 2014-01-04 DIAGNOSIS — Z955 Presence of coronary angioplasty implant and graft: Secondary | ICD-10-CM

## 2014-01-04 DIAGNOSIS — Z9889 Other specified postprocedural states: Secondary | ICD-10-CM

## 2014-01-04 DIAGNOSIS — R7989 Other specified abnormal findings of blood chemistry: Secondary | ICD-10-CM

## 2014-01-04 LAB — CBC
HCT: 26.9 % — ABNORMAL LOW (ref 36.0–46.0)
Hemoglobin: 8.5 g/dL — ABNORMAL LOW (ref 12.0–15.0)
MCH: 25.5 pg — ABNORMAL LOW (ref 26.0–34.0)
MCHC: 31.6 g/dL (ref 30.0–36.0)
MCV: 80.8 fL (ref 78.0–100.0)
Platelets: 184 K/uL (ref 150–400)
RBC: 3.33 MIL/uL — ABNORMAL LOW (ref 3.87–5.11)
RDW: 14.8 % (ref 11.5–15.5)
WBC: 6 K/uL (ref 4.0–10.5)

## 2014-01-04 MED ORDER — HYDROMORPHONE HCL 1 MG/ML IJ SOLN
0.5000 mg | Freq: Once | INTRAMUSCULAR | Status: AC
  Administered 2014-01-04: 0.5 mg via INTRAVENOUS

## 2014-01-04 MED ORDER — HYDROMORPHONE HCL 1 MG/ML IJ SOLN
0.5000 mg | Freq: Once | INTRAMUSCULAR | Status: AC | PRN
  Administered 2014-01-04: 0.5 mg via INTRAVENOUS
  Filled 2014-01-04 (×2): qty 1

## 2014-01-04 MED ORDER — HYDROMORPHONE HCL 1 MG/ML IJ SOLN
0.5000 mg | Freq: Once | INTRAMUSCULAR | Status: AC | PRN
  Administered 2014-01-04: 0.5 mg via INTRAVENOUS

## 2014-01-04 MED ORDER — BISACODYL 5 MG PO TBEC
5.0000 mg | DELAYED_RELEASE_TABLET | Freq: Every day | ORAL | Status: DC | PRN
  Administered 2014-01-04: 5 mg via ORAL
  Filled 2014-01-04: qty 1

## 2014-01-04 NOTE — Progress Notes (Signed)
CARDIAC REHAB PHASE I   PRE:  Rate/Rhythm: 67 SR    BP: sitting 120/56    SaO2: 97 RA  MODE:  Ambulation: to BR   POST:  Rate/Rhythm: 75     BP: sitting 118/72     SaO2: 98 RA  Pt off bedrest. Ambulated to BR without problems except urgency to urinate. However upon standing from bathroom pt became sick and had to vomit in trash can. Sat for a couple minutes with BP 118/72. Pt then able to walk to recliner. No other problems. Will f/u in am for ambulation in hall. [id number]  Josephina Shih [place] CES, ACSM 01/04/2014 3:18 PM

## 2014-01-04 NOTE — Progress Notes (Signed)
VASCULAR LAB PRELIMINARY  PRELIMINARY  PRELIMINARY  PRELIMINARY  Right groin ultrasound completed.    Preliminary report:  Pseudoaneurysm noted in the right CFA at the bifurcation.  Short neck.  Nole Robey, RVT 01/04/2014, 9:06 AM

## 2014-01-04 NOTE — Progress Notes (Signed)
Patient requesting medication to help her have a  Bowel movement. Paged on call doctor, awaiting page.

## 2014-01-04 NOTE — Progress Notes (Signed)
1100 compression of R groin peudo aneurisym done at bedside by ultrasound Pa Katie at the bedside.. Pt given Dilaudid 0.5 mg during procedure . Tolerated the procedure well. Bedrest wii bell observed x 3 hours as per Dr. Gwenlyn Found

## 2014-01-04 NOTE — Progress Notes (Addendum)
Subjective: She is feeling much better.  She is able to move her leg without pain.  No CP or orthopnea. Was hoping to go home today but understands she needs compression of her pseudoaneurysm.   Objective: Vital signs in last 24 hours: Temp:  [97.5 F (36.4 C)-98.6 F (37 C)] 97.9 F (36.6 C) (12/07 0548) Pulse Rate:  [67-84] 67 (12/07 0548) Resp:  [16-17] 17 (12/07 0548) BP: (99-104)/(47-54) 104/54 mmHg (12/07 0548) SpO2:  [94 %-96 %] 96 % (12/07 0548) Weight:  [155 lb 1.6 oz (70.353 kg)] 155 lb 1.6 oz (70.353 kg) (12/07 0548) Last BM Date: 01/01/14  Intake/Output from previous day: 12/06 0701 - 12/07 0700 In: 1240 [P.O.:1240] Out: 1275 [Urine:1275] Intake/Output this shift:    Medications Current Facility-Administered Medications  Medication Dose Route Frequency Provider Last Rate Last Dose  . acetaminophen (TYLENOL) tablet 650 mg  650 mg Oral Q4H PRN Troy Sine, MD   650 mg at 01/01/14 0856  . aspirin EC tablet 81 mg  81 mg Oral Daily Pixie Casino, MD   81 mg at 01/03/14 1019  . atorvastatin (LIPITOR) tablet 40 mg  40 mg Oral q1800 Satira Sark, MD   40 mg at 01/03/14 1858  . budesonide-formoterol (SYMBICORT) 160-4.5 MCG/ACT inhaler 2 puff  2 puff Inhalation BID Pixie Casino, MD   2 puff at 01/03/14 2149  . carvedilol (COREG) tablet 6.25 mg  6.25 mg Oral BID WC Doreene Burke Kilroy, PA-C   6.25 mg at 01/03/14 1858  . clopidogrel (PLAVIX) tablet 75 mg  75 mg Oral Q breakfast Burnell Blanks, MD   75 mg at 01/03/14 1019  . docusate sodium (COLACE) capsule 100 mg  100 mg Oral Daily Jules Husbands, MD   100 mg at 01/03/14 2235  . furosemide (LASIX) tablet 40 mg  40 mg Oral Daily Erlene Quan, PA-C   40 mg at 01/03/14 1019  . HYDROcodone-acetaminophen (NORCO/VICODIN) 5-325 MG per tablet 1-2 tablet  1-2 tablet Oral Q4H PRN Lonn Georgia, PA-C   2 tablet at 01/03/14 2235  . levalbuterol (XOPENEX) nebulizer solution 0.63 mg  0.63 mg Nebulization Q6H PRN Pixie Casino, MD      . lisinopril (PRINIVIL,ZESTRIL) tablet 5 mg  5 mg Oral Daily Erlene Quan, PA-C   5 mg at 01/03/14 1019  . ondansetron (ZOFRAN) injection 4 mg  4 mg Intravenous Q6H PRN Troy Sine, MD      . polyethylene glycol (MIRALAX / GLYCOLAX) packet 17 g  17 g Oral Daily Brett Canales, PA-C   17 g at 01/03/14 1030  . spironolactone (ALDACTONE) tablet 12.5 mg  12.5 mg Oral Daily Luke K Kilroy, PA-C   12.5 mg at 01/03/14 1020    PE: General appearance: alert, cooperative and no distress Lungs: clear to auscultation bilaterally Heart: regular rate and rhythm and harsh raspy 2/6 sys MM at Apex Extremities: No LEE Pulses: 2+ and symmetric Skin: Warm and dry.  no bruit in the right groin.   Neurologic: Grossly normal  Lab Results:   Recent Labs  01/02/14 0458 01/02/14 1423 01/03/14 0535 01/04/14 0328  WBC 6.9  --  7.0 6.0  HGB 8.0* 8.5* 8.5* 8.5*  HCT 26.2* 27.7* 27.8* 26.9*  PLT 143*  --  171 184   BMET  Recent Labs  01/02/14 0458  NA 139  K 4.6  CL 104  CO2 25  GLUCOSE 101*  BUN 17  CREATININE 0.70  CALCIUM 8.7    Echocardiogram (12/25/2013): Study Conclusions - Left ventricle: The cavity size was normal. Wall thickness was increased in a pattern of moderate LVH. The estimated ejection fraction was 15%. Severe global hypokinesis with akinesis of the inferior, basal inferoseptal and apical walls. diastolic dysfunction (probably restrictive) with very high LV filling pressure. - Aortic valve: Poorly visualized. Mildly calcified leaflets. There was no stenosis. There was no regurgitation. - Mitral valve: Calcified annulus. Mildly thickened leaflets . There was moderate regurgitation. - Left atrium: Moderately dilated at 44 ml/m2. - Right ventricle: The cavity size was normal. Wall thickness was normal. Systolic function was normal. Lateral annulus peak S velocity: 13.2 cm/s. - Right atrium: The atrium was normal in size. - Atrial  septum: Aneurysmal interatrial septum. PFO cannot be excluded. - Tricuspid valve: There was moderate regurgitation. - Pulmonary arteries: PA peak pressure: 50 mm Hg (S). Impressions: - LVEF 15%, severe global hypokinesis with inferior, inferoseptal and apical akinesis, moderate LVH, restrictive diastology wtih very high LV filling pressures, moderate MR and TR, moderately dilated LA, atrial septal aneurysm (cannot exclude PFO), moderate pulmonary hypertension - RVSP 50 mmHg.   Assessment/Plan   Principal Problem:   Acute respiratory failure Active Problems:   Asthma, persistent controlled   LBBB (left bundle branch block)   NSTEMI (non-ST elevated myocardial infarction)   Mitral regurgitation   Cardiomyopathy, ischemic-EF 15%-pre PCI   Acute systolic CHF (congestive heart failure)   CAD S/P high risk LAD PCI/DES 12/30/13   Pseudoaneurysm following PCI   Blood loss anemia  Beth Johnston is a 80 y.o. female with a history of asthma, LBBB (2013) and no prior cardiac history who presented to [facility] on 12/25/13 with acute dyspnea. She was found to have acute pulmonary edema, NSTEMI and acute systolic CHF.   CAD/NSTEMI- s/p LHC 12/2 w/ severe stenosis proximal and mid LAD with severe LV dysfunction. High risk PCI with rotablator atherectomy of the proximal and mid LAD, DES x 2 proximal and mid LAD.  Severe LV systolic dysfunction, case supported by Impella LV assist device.  -- Continue ASA, plavix, coreg 6.25 BID  Small to medium pseudoaneurysm.   -- Repeat US this AM with "pseudoaneurysm noted in the right CFA at the bifurcation. Short neck." -- Hgb stable at 8.5 but was 10.9 on 12/2.  Continue to monitor. --Vascular US aided femoral pseudoaneurysm compression today. Looks like it is closed after about 30 minutes of compression. Will let her eat and then vascular will come re check tonight. -- If remains stable will plan on DC tomorrow.   Ischemic CM- EF 15% by echo.   --  Net neg 4L. appears euvolemic. -- Continue Lasix 40mg  daily, carvediolol 6.25mg  BID, aldactone 12.5 daily, lisinopril 5 daily.  -- Will need follow up echo in 2-3 months. If no improvement on cardiac meds may need ICD placement.  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  NSVT- 7 beat run noted on tele. May need lifevest with EF 15%. MD to see.    LOS: 10 days    Eileen Stanford PA-C 01/04/2014 10:04 AM   Agree with note by Nell Range PA-C.  S/P High risk hemodynamically supported (Impella) LAD HSRA/PCI stent with severe LV dysfunction (EF 15%). NSVT on Tele. Had small pseudoaneurysm on duplex, compressed today. Agree with LifeVest. Prior to DC home.  Lorretta Harp, M.D., [place], [facility], [place] [address]  [place], [place]  [address]  [phone] 01/04/2014 1:01 PM

## 2014-01-04 NOTE — Progress Notes (Signed)
The patient received Vicodin x 1 overnight for mild pain in her right groin.  She did not complain of any pain this morning in the area when asked.

## 2014-01-05 ENCOUNTER — Other Ambulatory Visit: Payer: Self-pay | Admitting: Cardiology

## 2014-01-05 DIAGNOSIS — I724 Aneurysm of artery of lower extremity: Secondary | ICD-10-CM

## 2014-01-05 DIAGNOSIS — I429 Cardiomyopathy, unspecified: Secondary | ICD-10-CM

## 2014-01-05 LAB — CBC
HCT: 28.2 % — ABNORMAL LOW (ref 36.0–46.0)
Hemoglobin: 8.8 g/dL — ABNORMAL LOW (ref 12.0–15.0)
MCH: 25.1 pg — ABNORMAL LOW (ref 26.0–34.0)
MCHC: 31.2 g/dL (ref 30.0–36.0)
MCV: 80.3 fL (ref 78.0–100.0)
Platelets: 218 10*3/uL (ref 150–400)
RBC: 3.51 MIL/uL — ABNORMAL LOW (ref 3.87–5.11)
RDW: 14.5 % (ref 11.5–15.5)
WBC: 6.1 10*3/uL (ref 4.0–10.5)

## 2014-01-05 MED ORDER — CLOPIDOGREL BISULFATE 75 MG PO TABS: 75.0000 mg | ORAL_TABLET | Freq: Every day | ORAL | Status: DC

## 2014-01-05 MED ORDER — LISINOPRIL 5 MG PO TABS: 5.0000 mg | ORAL_TABLET | Freq: Every day | ORAL | Status: DC

## 2014-01-05 MED ORDER — SPIRONOLACTONE 25 MG PO TABS: 12.5000 mg | ORAL_TABLET | Freq: Every day | ORAL | Status: DC

## 2014-01-05 MED ORDER — FUROSEMIDE 40 MG PO TABS: 40.0000 mg | ORAL_TABLET | Freq: Every day | ORAL | Status: DC

## 2014-01-05 MED ORDER — CARVEDILOL 6.25 MG PO TABS: 6.2500 mg | ORAL_TABLET | Freq: Two times a day (BID) | ORAL | Status: DC

## 2014-01-05 MED ORDER — ASPIRIN 81 MG PO TBEC: 81.0000 mg | DELAYED_RELEASE_TABLET | Freq: Every day | ORAL | Status: DC

## 2014-01-05 MED ORDER — ATORVASTATIN CALCIUM 40 MG PO TABS: 40.0000 mg | ORAL_TABLET | Freq: Every day | ORAL | Status: DC

## 2014-01-05 NOTE — Progress Notes (Signed)
Patient Profile: 80 y.o. female with a history of asthma, LBBB (2013) and no prior cardiac history who presented to [facility] on 12/25/13 with acute dyspnea. She was found to have acute pulmonary edema, NSTEMI and acute systolic CHF.   Subjective: No complaints. Denies CP and dyspnea. Right anterior groin is still sore but denies extreme pain. No flank or low back pain. Has been ambulating around room w/o difficulty. She is still not 100% sure she wants to take [place] home.   Objective: Vital signs in last 24 hours: Temp:  [97.2 F (36.2 C)-98.3 F (36.8 C)] 98.3 F (36.8 C) (12/08 0450) Pulse Rate:  [65-69] 66 (12/08 0450) Resp:  [16-18] 18 (12/08 0450) BP: (104-124)/(48-62) 105/51 mmHg (12/08 0450) SpO2:  [96 %-99 %] 97 % (12/08 0450) Weight:  [152 lb 8.9 oz (69.2 kg)] 152 lb 8.9 oz (69.2 kg) (12/08 0450) Last BM Date: 01/01/14  Intake/Output from previous day: 12/07 0701 - 12/08 0700 In: 150 [P.O.:150] Out: 200 [Urine:200] Intake/Output this shift:    Medications Current Facility-Administered Medications  Medication Dose Route Frequency Provider Last Rate Last Dose  . acetaminophen (TYLENOL) tablet 650 mg  650 mg Oral Q4H PRN Troy Sine, MD   650 mg at 01/01/14 0856  . aspirin EC tablet 81 mg  81 mg Oral Daily Pixie Casino, MD   81 mg at 01/04/14 1200  . atorvastatin (LIPITOR) tablet 40 mg  40 mg Oral q1800 Satira Sark, MD   40 mg at 01/04/14 1800  . bisacodyl (DULCOLAX) EC tablet 5 mg  5 mg Oral Daily PRN Fay Records, MD   5 mg at 01/04/14 2317  . budesonide-formoterol (SYMBICORT) 160-4.5 MCG/ACT inhaler 2 puff  2 puff Inhalation BID Pixie Casino, MD   2 puff at 01/05/14 0740  . carvedilol (COREG) tablet 6.25 mg  6.25 mg Oral BID WC Doreene Burke Kilroy, PA-C   6.25 mg at 01/04/14 1759  . clopidogrel (PLAVIX) tablet 75 mg  75 mg Oral Q breakfast Burnell Blanks, MD   75 mg at 01/04/14 0900  . docusate sodium (COLACE) capsule 100 mg  100 mg Oral Daily Jules Husbands, MD   100 mg at 01/04/14 1140  . furosemide (LASIX) tablet 40 mg  40 mg Oral Daily Erlene Quan, PA-C   40 mg at 01/04/14 1142  . HYDROcodone-acetaminophen (NORCO/VICODIN) 5-325 MG per tablet 1-2 tablet  1-2 tablet Oral Q4H PRN Lonn Georgia, PA-C   2 tablet at 01/03/14 2235  . levalbuterol (XOPENEX) nebulizer solution 0.63 mg  0.63 mg Nebulization Q6H PRN Pixie Casino, MD      . lisinopril (PRINIVIL,ZESTRIL) tablet 5 mg  5 mg Oral Daily Erlene Quan, PA-C   5 mg at 01/04/14 1148  . ondansetron (ZOFRAN) injection 4 mg  4 mg Intravenous Q6H PRN Troy Sine, MD   4 mg at 01/04/14 1738  . polyethylene glycol (MIRALAX / GLYCOLAX) packet 17 g  17 g Oral Daily Brett Canales, PA-C   17 g at 01/04/14 1142  . spironolactone (ALDACTONE) tablet 12.5 mg  12.5 mg Oral Daily Doreene Burke Kilroy, PA-C   12.5 mg at 01/04/14 1141    PE: General appearance: alert, cooperative and no distress Neck: no JVD Lungs: clear to auscultation bilaterally Heart: regular rate and rhythm Extremities: no LEE Pulses: 2+ and symmetric Skin: warm and dry Neurologic: Grossly normal  Lab Results:   Recent Labs  01/03/14 0535 01/04/14  9833 01/05/14 0545  WBC 7.0 6.0 6.1  HGB 8.5* 8.5* 8.8*  HCT 27.8* 26.9* 28.2*  PLT 171 184 218     Assessment/Plan  Principal Problem:   Acute respiratory failure Active Problems:   Asthma, persistent controlled   LBBB (left bundle branch block)   NSTEMI (non-ST elevated myocardial infarction)   Mitral regurgitation   Cardiomyopathy, ischemic-EF 15%-pre PCI   Acute systolic CHF (congestive heart failure)   CAD S/P high risk LAD PCI/DES 12/30/13   Pseudoaneurysm following PCI   Blood loss anemia   Acute heart failure   Elevated troponin   Stented coronary artery  CAD/NSTEMI- s/p LHC 12/2 w/ severe stenosis of proximal and mid LAD with severe LV dysfunction. High risk PCI with rotablator atherectomy of the proximal and mid LAD, DES x 2 proximal and mid LAD.  Severe LV systolic dysfunction, case supported by Impella LV assist device.  -- Continue ASA, plavix, coreg 6.25 BID  Small to medium pseudoaneurysm.  S/p ultrasound guided compression 01/14/14 -- Hgb stable w/o further drop, 8.5 -->8.8 -- PE w/o right femoral bruit. ? Need for repeat ultrasound to ensure closure.    Ischemic CM- EF 15% by echo.  -- Net neg 4L. appears euvolemic. -- Continue Lasix 40mg  daily, carvediolol 6.25mg  BID, aldactone 12.5 daily, lisinopril 5 daily.  -- LifeVest fitted.  -- Will need follow up echo in 2-3 months. If no improvement on cardiac meds may need ICD placement.  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  NSVT- 7 beat run noted on tele earlier this admission. LifeVest ordered w/ EF at 15%, but patient still not 100% sure she wants to use at home.    LOS: 11 days    Brittainy M. Ladoris Gene 01/05/2014 7:45 AM  Agree with note written by Ellen Henri  [facility]  OK for D/C home. No CP/SOB. Right groin w/o bruit, 2+RPP. Long discusion re LifeVest with Pt and Family. She is incompas mentis and has decided not to wear LV. Will check groin duplex as OP, ROV with MLP 1-2 weeks and with MD 3-4 weeks. [facility]. @D  in 3 months for LV fxn & to decide re ICD Rx for primary prevention (EF 15%). OK for D/C home.   Lorretta Harp 01/05/2014 10:01 AM

## 2014-01-05 NOTE — Discharge Summary (Signed)
Physician Discharge Summary  Patient ID: Beth Johnston MRN: [id number] DOB/AGE: [birth year] 78 y.o.   Primary Cardiologist: Dr. Angelena Form  Admit date: 12/25/2013 Discharge date: 01/05/2014  Admission Diagnoses: Acute Respiratory Failure  Discharge Diagnoses:  Active Problems:   Asthma, persistent controlled   LBBB (left bundle branch block)   NSTEMI (non-ST elevated myocardial infarction)   Mitral regurgitation   Cardiomyopathy, ischemic-EF 15%-pre PCI   Acute systolic CHF (congestive heart failure)   CAD S/P high risk LAD PCI/DES 12/30/13   Pseudoaneurysm following PCI   Blood loss anemia   Acute heart failure   Elevated troponin   Stented coronary artery   Discharged Condition: stable  Hospital Course: The patient is a 80 y.o. female with a past medical history significant for asthma and upper airway cough syndrome followed by Dr. Halford Chessman. She also has a history of left bundle branch block which was noted in 2013, however no prior cardiac work-up.   She presented to [facility] on 12/25/13 with acute onset of dyspnea. 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 She was on the job working when she developed symptoms. She noted associated diaphoresis, but no chest pain. She was treated with oxygen and transported to the ED.   On arrival, she was placed on BiPAP for marked dyspnea and given IV lasix for diuresis. Laboratory work indicated elevated BNP and mildly elevated troponin. Chest x-ray showed small pleural effusion and signs of pulmonary congestion and cardiomegaly. Cardiac enzymes continued to be cycled and increased dramatically, peaking at 18.80. A 2D echo demonstrated severe systolic dysfunction, with EF at 15%. There was severe global hypokinesis with akinesis of the inferior, basal and inferoseptal and apical walls. She had restrictive physiology and moderate mitral regurgitation. There was moderate pulmonary hypertension with a PA estimated pressure at 50 mm.   She underwent a R/LHC on 12/28/13 by Dr. Claiborne Billings. She was noted to have severe two-vessel CAD with 80-90% diffuse proximal LAD stenosis with 80% focal stenosis in a large first diagonal vessel and 80% ostial stenosis of the second diagonal vessel, and total occlusion of the mid RCA proximal to the acute margin with predominant right to left collaterals. Pulmonary pressures had improved dramatically by time of cath down to 30 mm PA pressure. Options were discussed with the patient in detail concerning possible surgical evaluation and evaluation for viable myocardium. She verbalized that she wished to avoid CABG revascularization. She was informed that if percutaneous coronary intervention was to be performed, it would be high riskand would require PCI with IABP/Impella for cardiac support during the intervention. After consideration, she opted to undergo attempt at PCI. The procedure was performed by Dr. Angelena Form on 12/30/13. She underwent successful high risk PCI with rotablator atherectomy of the proximal and mid LAD, DES x 2 in the proximal and mid LAD. This was performed using Impella LV assist device. She tolerated the procedure well and was placed on DAPT with ASA + Plavix.  Post-cath recovery was complicated by a right groin pseudo aneurysm . Hgb remained fairly stable. She required ultrasound guided compression. Post-exam was negative for femoral bruit and her pain improved. Also of note was runs of NSVT on telemetry. Given her low EF, it was recommended that she be bridged with a LifeVest at time of discharge. However, after consideration, she opted not to wear a LifeVest. She was informed of potential risk and still decided against protection. She was placed on the appropriate medical therapy including an ACE-I,  BB, spironolactone and diuretic. A statin was also added for her CAD.   On hospital day 11, she was seen and examined by Dr. Gwenlyn Found who determined she was stable for discharge home. She denied CP.  She had no further dyspnea. Right femoral access site was felt to be stable. She was on the appropriate medical therapy. Dr. Gwenlyn Found recommended a f/u right LE arterial ultrasound in 1 week. Post-hospital f/u has been arranged with Kerin Ransom, PA-C, on 01/14/14. Follow-up with Dr. Angelena Form has been arranged for 01/26/14. She will need a repeat 2D echo in 3 months.    Consults: None  Significant Diagnostic Studies:  2D echo   [facility] 12/28/13 HEMODYNAMICS:   RA: A wave 6; mean 5 RV: 30/ 5 PA: 30/14 PC: a 12 v 10; mean 10 initially;   LV: 134/19 PC: a 21 v 17;ean 18 AO: 133/56  Oxygen saturation in the aorta 98% and the pulmonary artery 66%  Cardiac output: 3.9 l/min (Thermo); 4.8 (Fick)  Cardiac index: 2.2 l/m/m2 2.7  ANGIOGRAPHY:   Left main: Angiographically normal and bifurcated into an LAD and left circumflex vessel.  LAD: Large caliber vessel which immediately gave rise to a proximal diagonal vessel. There was an 80% focal stenosis in the proximal portion of this diagonal 1 vessel. The LAD had diffuse stenoses of 80-90% proximally after the Diagonal-1 vessel and then had another area of 80% stenosis proximal and distal to the second diagonal vessel with narrowing of 70-80% ostially in this second diagonal vessel. There were significant septal collateral supplying the distal RCA. The LAD extended to the LV apex.  Left circumflex: Large caliber vessel that gave rise to 2 major marginal branches and was free of significant obstructive disease.   Right coronary artery: Small to moderate size vessel that was subtotally/totally occluded after the takeoff of the first RV marginal branch and then was totally occluded after the acute margin. There were moderate left to right collaterals originating predominantly from LAD septal perforating arteries.  Left ventriculography revealed a dilated left ventricle with severe global hypokinesis and an ejection fraction of  10-15%.  LHC 12/30/13   Hemodynamic Findings: Central aortic pressure: 124/77 LV pressures measured with Impella (see cath lab report)  Impression: 1. NSTEMI, severe stenosis proximal and mid LAD with severe LV dysfunction 2. High risk PCI with rotablator atherectomy of the proximal and mid LAD, DES x 2 proximal and mid LAD 3. Severe LV systolic dysfunction, case supported by Impella LV assist device   Lower Extremity Arterial Duplex Right 01/01/14 Summary:  - Positive for pseudoaneurysm. - Neck measurement 0.55 cm. Pseudo transverse 0.49 cm at the neck and 2.19 cm with and active flow 2.02 cm. - No evidence of arteriovenous fistula.  Treatments: See Hospital Course   Discharge Exam: Blood pressure 120/57, pulse 73, temperature 98.3 F (36.8 C), temperature source Oral, resp. rate 16, height 5\' 6"  (1.676 m), weight 152 lb 8.9 oz (69.2 kg), SpO2 97 %.   Disposition: 01-Home or Self Care      Discharge Instructions    Amb Referral to Cardiac Rehabilitation    Complete by:  As directed      Diet - low sodium heart healthy    Complete by:  As directed      Driving Restrictions    Complete by:  As directed   No driving until follow-up appointment     Increase activity slowly    Complete by:  As directed  Lifting restrictions    Complete by:  As directed   No heavy lifting until follow-up appointment            Medication List    TAKE these medications        aspirin 81 MG EC tablet  Take 1 tablet (81 mg total) by mouth daily.     atorvastatin 40 MG tablet  Commonly known as:  LIPITOR  Take 1 tablet (40 mg total) by mouth daily at 6 PM.     budesonide-formoterol 160-4.5 MCG/ACT inhaler  Commonly known as:  SYMBICORT  Inhale 2 puffs into the lungs 2 (two) times daily.     carvedilol 6.25 MG tablet  Commonly known as:  COREG  Take 1 tablet (6.25 mg total) by mouth 2 (two) times daily with a meal.     clopidogrel 75 MG tablet  Commonly known as:   PLAVIX  Take 1 tablet (75 mg total) by mouth daily with breakfast.     fluticasone 50 MCG/ACT nasal spray  Commonly known as:  FLONASE  Place 2 sprays into both nostrils daily.     furosemide 40 MG tablet  Commonly known as:  LASIX  Take 1 tablet (40 mg total) by mouth daily.     lisinopril 5 MG tablet  Commonly known as:  PRINIVIL,ZESTRIL  Take 1 tablet (5 mg total) by mouth daily.     spironolactone 25 MG tablet  Commonly known as:  ALDACTONE  Take 0.5 tablets (12.5 mg total) by mouth daily.       Follow-up Information    Follow up with [facility] On 01/11/2014.   Why:  10:00 am For ultrasound of leg   Contact information:   [place] [id number] [phone]      Follow up with Erlene Quan, PA-C On 01/14/2014.   Specialty:  Cardiology   Why:  10:00 am (Dr. Camillia Herter PA)   Contact information:   [address] [place] [address] [phone]       Follow up with Lauree Chandler, MD On 01/26/2014.   Specialty:  Cardiology   Why:  10:00 am   Contact information:   [place] [place] [address] [phone]      TIME SPENT ON DISCHARGE, INCLUDING PHYSICIAN TIME: >30 MINUTES  Signed: Lyda Jester 01/05/2014, 4:58 PM

## 2014-01-05 NOTE — Progress Notes (Signed)
CARDIAC REHAB PHASE I   PRE:  Rate/Rhythm: 69 off monitor   BP:  Supine:   Sitting: 100/40  Standing:    SaO2: 99 RA  MODE:  Ambulation: 400 ft   POST:  Rate/Rhythm: 92  BP:  Supine:   Sitting: 120/50  Standing:    SaO2: 100 RA 1115-1150 Assisted X 1 to ambulate with hand held assist. Gait steady. Pt c/o of right groin site tender, but is feeling better. Pt able to walk 400 feet without c/o of cp or SOB. VS stable Pt to side of bed after walk with call light in reach and family present. Completed discharge education with pt. We discussed CHF, zones, fluid and sodium restrictions and exercise guidelines. She voices understanding.  Rodney Langton RN 01/05/2014 11:45 AM

## 2014-01-05 NOTE — Progress Notes (Signed)
VASCULAR LAB PRELIMINARY  PRELIMINARY  PRELIMINARY  PRELIMINARY  Right groin recheck completed.    Preliminary report:  Pseudoaneurysm remains thrombosed the morning after compression.  Shawanna Zanders, RVT 01/05/2014, 11:01 AM

## 2014-01-05 NOTE — Plan of Care (Signed)
Problem: Phase III Progression Outcomes Goal: Activity at appropriate level-compared to baseline (UP IN CHAIR FOR HEMODIALYSIS)  Outcome: Completed/Met Date Met:  01/05/14 Goal: Dyspnea controlled with activity Outcome: Completed/Met Date Met:  01/05/14 Goal: Discharge plan remains appropriate-arrangements made Outcome: Completed/Met Date Met:  01/05/14

## 2014-01-06 ENCOUNTER — Telehealth: Payer: Self-pay | Admitting: *Deleted

## 2014-01-06 ENCOUNTER — Other Ambulatory Visit (HOSPITAL_COMMUNITY): Payer: Self-pay | Admitting: Cardiology

## 2014-01-06 ENCOUNTER — Telehealth: Payer: Self-pay | Admitting: Physician Assistant

## 2014-01-06 DIAGNOSIS — I729 Aneurysm of unspecified site: Secondary | ICD-10-CM

## 2014-01-06 NOTE — Telephone Encounter (Signed)
Faxed cardiac rehab order - phase 2 - no gxt required.

## 2014-01-06 NOTE — Telephone Encounter (Signed)
Patient recently underwent coronary cardiac catheterization under in [facility] support. Hospitalization was complicated by a right groin pseudoaneurysm. Patient by patient's daughter as her mothers right groin has continued to be extremely painful. She has not been able to walk around and has to lift her leg with her hand in order to move it. She also noticed some swelling on the right lower extremity. She is willing to bring patient in for U/S of groin, i contacted the hospital operator and was told no U/S tech on staff after 7pm. I attempted to call the patient back, however could not reach her again x2 and her voice mail is full so I cannot leave a message. I will try again later.  Hilbert Corrigan PA Pager: [phone]

## 2014-01-06 NOTE — Telephone Encounter (Signed)
Finally got in touch with patient's daughter, apparently she was on the phone with Dr. Angelena Form. All question answered. She will seek medical attention if has an enlarging pulsatile mass or loss of sensation in RLE. Otherwise will treat conservatively.  Hilbert Corrigan PA Pager: [phone]

## 2014-01-07 ENCOUNTER — Encounter (HOSPITAL_COMMUNITY): Payer: Self-pay | Admitting: Cardiovascular Disease

## 2014-01-11 ENCOUNTER — Ambulatory Visit (HOSPITAL_COMMUNITY): Payer: Medicare Other | Attending: Cardiology | Admitting: Cardiology

## 2014-01-11 ENCOUNTER — Telehealth: Payer: Self-pay | Admitting: *Deleted

## 2014-01-11 DIAGNOSIS — I97618 Postprocedural hemorrhage and hematoma of a circulatory system organ or structure following other circulatory system procedure: Secondary | ICD-10-CM | POA: Diagnosis not present

## 2014-01-11 DIAGNOSIS — I729 Aneurysm of unspecified site: Secondary | ICD-10-CM

## 2014-01-11 DIAGNOSIS — R609 Edema, unspecified: Secondary | ICD-10-CM | POA: Diagnosis not present

## 2014-01-11 DIAGNOSIS — R1031 Right lower quadrant pain: Secondary | ICD-10-CM

## 2014-01-11 DIAGNOSIS — R1030 Lower abdominal pain, unspecified: Secondary | ICD-10-CM | POA: Insufficient documentation

## 2014-01-11 NOTE — Telephone Encounter (Signed)
Pt in office today for LE doppler and mentioned to receptionist that she was having pain in back between shoulder blades. She called triage and went to evaluate. States she has had  pain in back between shoulder blade off and on since Friday.Had pain during church yesterday and took some tylenol when she got home and has not had any pain since. She wants to know if could be heart related.  Denies pain at this time. Spoke w/Dayna Dunn,PA/flex who states we can not tell her that it is heart related without w/u.  EKG would show LBBB. She is scheduled to see Jana Half on Thurs 12/17. Advised if pain is constant to go to ER otherwise will be seen on Thurs.  She did have a long stay in hospital from 11/27-12/8 and could be muscle related.  May take tylenol for relief but if continues will need to go to ER. She verbalizes understanding and will be seen on Thursday unless pain becomes worse then she will go to ER.  Very pleasant lady.

## 2014-01-11 NOTE — Progress Notes (Signed)
Right lower arterial duplex groin performed

## 2014-01-14 ENCOUNTER — Encounter: Payer: Medicare Other | Admitting: Cardiology

## 2014-01-25 ENCOUNTER — Encounter: Payer: Self-pay | Admitting: Cardiology

## 2014-01-25 ENCOUNTER — Ambulatory Visit (INDEPENDENT_AMBULATORY_CARE_PROVIDER_SITE_OTHER): Payer: Medicare Other | Admitting: Cardiology

## 2014-01-25 ENCOUNTER — Other Ambulatory Visit (INDEPENDENT_AMBULATORY_CARE_PROVIDER_SITE_OTHER): Payer: Medicare Other

## 2014-01-25 ENCOUNTER — Other Ambulatory Visit (HOSPITAL_COMMUNITY): Payer: Medicare Other

## 2014-01-25 DIAGNOSIS — I2510 Atherosclerotic heart disease of native coronary artery without angina pectoris: Secondary | ICD-10-CM | POA: Diagnosis not present

## 2014-01-25 DIAGNOSIS — Z9861 Coronary angioplasty status: Secondary | ICD-10-CM

## 2014-01-25 DIAGNOSIS — I255 Ischemic cardiomyopathy: Secondary | ICD-10-CM | POA: Diagnosis not present

## 2014-01-25 LAB — BASIC METABOLIC PANEL
BUN: 24 mg/dL — ABNORMAL HIGH (ref 6–23)
CO2: 30 mEq/L (ref 19–32)
Calcium: 9.3 mg/dL (ref 8.4–10.5)
Chloride: 107 mEq/L (ref 96–112)
Creatinine, Ser: 0.7 mg/dL (ref 0.4–1.2)
GFR: 79.07 mL/min (ref >60.00)
Glucose, Bld: 94 mg/dL (ref 70–99)
Potassium: 4.3 mEq/L (ref 3.5–5.1)
Sodium: 142 mEq/L (ref 135–145)

## 2014-01-25 LAB — CBC WITH DIFFERENTIAL/PLATELET
Basophils Absolute: 0 10*3/uL (ref 0.0–0.1)
Basophils Relative: 1 % (ref 0.0–3.0)
Eosinophils Absolute: 0.3 10*3/uL (ref 0.0–0.7)
Eosinophils Relative: 6.4 % — ABNORMAL HIGH (ref 0.0–5.0)
HCT: 34.2 % — ABNORMAL LOW (ref 36.0–46.0)
Hemoglobin: 10.9 g/dL — ABNORMAL LOW (ref 12.0–15.0)
Lymphocytes Relative: 30.3 % (ref 12.0–46.0)
Lymphs Abs: 1.5 10*3/uL (ref 0.7–4.0)
MCHC: 31.9 g/dL (ref 30.0–36.0)
MCV: 79.8 fl (ref 78.0–100.0)
Monocytes Absolute: 0.6 10*3/uL (ref 0.1–1.0)
Monocytes Relative: 11 % (ref 3.0–12.0)
Neutro Abs: 2.6 10*3/uL (ref 1.4–7.7)
Neutrophils Relative %: 51.3 % (ref 43.0–77.0)
Platelets: 181 10*3/uL (ref 150.0–400.0)
RBC: 4.28 Mil/uL (ref 3.87–5.11)
RDW: 17.7 % — ABNORMAL HIGH (ref 11.5–15.5)
WBC: 5.1 10*3/uL (ref 4.0–10.5)

## 2014-01-25 MED ORDER — LOSARTAN POTASSIUM 25 MG PO TABS: 25.0000 mg | ORAL_TABLET | Freq: Every day | ORAL | Status: DC

## 2014-01-25 MED ORDER — PANTOPRAZOLE SODIUM 40 MG PO TBEC
40.0000 mg | DELAYED_RELEASE_TABLET | Freq: Every day | ORAL | Status: DC
Start: 2014-01-25 — End: 2015-09-01

## 2014-01-25 NOTE — Patient Instructions (Signed)
Your physician has recommended you make the following change in your medication:  1) STOP Lisinopril 2) START Cozaar 38mmg daily. An Rx has been sent to your pharmacy 3) START Protonix 40mg  daily. An RX has been sent to your pharmacy  Labs Today to be drawn at the [employer] office: Cbc,Bmet, Stool cards  Please have echo as previously scheduled  Keep follow up appointment with Dr.Mcalhany as planned

## 2014-01-25 NOTE — Assessment & Plan Note (Signed)
Change ACE to ARB

## 2014-01-25 NOTE — Progress Notes (Signed)
01/25/2014 Beth Johnston   06-Mar-1925  [id number]  Primary Physician No PCP Per Patient Primary Cardiologist: Dr Julianne Handler  HPI:  80 y/o admitted 12/25/13 with dyspnea. EF 15%. Troponin positive, no chest pain. She underwent cath and LAD DES. This was complicated by a pseudoaneurysm.  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 She is in the office today for f/u. She denies any chest pain or dyspnea. Her Rt groin is better.    Current Outpatient Prescriptions  Medication Sig Dispense Refill  . aspirin EC 81 MG EC tablet Take 1 tablet (81 mg total) by mouth daily.    Marland Kitchen atorvastatin (LIPITOR) 40 MG tablet Take 1 tablet (40 mg total) by mouth daily at 6 PM. 30 tablet 5  . budesonide-formoterol (SYMBICORT) 160-4.5 MCG/ACT inhaler Inhale 2 puffs into the lungs 2 (two) times daily. (Patient taking differently: Inhale 2 puffs into the lungs 2 (two) times daily as needed (shortness of breath). ) 1 Inhaler 6  . carvedilol (COREG) 6.25 MG tablet Take 1 tablet (6.25 mg total) by mouth 2 (two) times daily with a meal. 60 tablet 5  . clopidogrel (PLAVIX) 75 MG tablet Take 1 tablet (75 mg total) by mouth daily with breakfast. 30 tablet 11  . fluticasone (FLONASE) 50 MCG/ACT nasal spray Place 2 sprays into both nostrils daily. 16 g 2  . furosemide (LASIX) 40 MG tablet Take 1 tablet (40 mg total) by mouth daily. 30 tablet 5  . meloxicam (MOBIC) 7.5 MG tablet Take 7.5 mg by mouth 3 (three) times daily as needed.  1  . spironolactone (ALDACTONE) 25 MG tablet Take 0.5 tablets (12.5 mg total) by mouth daily. 30 tablet 5   No current facility-administered medications for this visit.    No Known Allergies  History   Social History  . Marital Status: Widowed    Spouse Name: N/A    Number of Children: N/A  . Years of Education: N/A   Occupational History  . Not on file.   Social History Main Topics  . Smoking status: Never Smoker   . Smokeless tobacco: Never Used  . Alcohol Use: No  . Drug Use: No  .  Sexual Activity: Not on file   Other Topics Concern  . Not on file   Social History Narrative     Review of Systems: General: negative for chills, fever, night sweats or weight changes.  Cardiovascular: negative for chest pain, dyspnea on exertion, edema, orthopnea, palpitations, paroxysmal nocturnal dyspnea or shortness of breath Dry cough Dermatological: negative for rash Respiratory: negative for cough or wheezing Urologic: negative for hematuria Abdominal: negative for nausea, vomiting, diarrhea, bright red blood per rectum, melena, or hematemesis Neurologic: negative for visual changes, syncope, or dizziness Light sensativity All other systems reviewed and are otherwise negative except as noted above.    Blood pressure 108/60, pulse 63, height 5\' 5"  (1.651 m), weight 152 lb 1.9 oz (69.001 kg).  General appearance: alert, cooperative and no distress Lungs: clear to auscultation bilaterally Heart: regular rate and rhythm and 2/6 systolic murmur AOV  EKG NSR, LBBB  ASSESSMENT AND PLAN:   CAD S/P high risk LAD PCI/DES 12/30/13 No angina  Cardiomyopathy, ischemic-EF 15%-pre PCI No CHF  Pseudoaneurysm following PCI Stable at follow up US 01/12/14  Cough Change ACE to ARB  Blood loss anemia Check f/u CBC with stool guaiacs and add PPI  LBBB  PLAN  Check CBC, stool giuacs BMP, change ACE to ARB, add PPI. Echo ordered  for March, f/u with Dr Julianne Handler in Jan  Elior Robinette KPA-C 01/25/2014 10:25 AM

## 2014-01-25 NOTE — Assessment & Plan Note (Signed)
No angina 

## 2014-01-25 NOTE — Assessment & Plan Note (Signed)
Check f/u CBC with stool guaiacs and add PPI

## 2014-01-25 NOTE — Assessment & Plan Note (Signed)
Stable at follow up US 01/12/14

## 2014-01-25 NOTE — Assessment & Plan Note (Signed)
No CHF- 

## 2014-02-26 ENCOUNTER — Encounter: Payer: Self-pay | Admitting: Cardiovascular Disease

## 2014-02-26 ENCOUNTER — Ambulatory Visit (INDEPENDENT_AMBULATORY_CARE_PROVIDER_SITE_OTHER): Payer: Medicare Other | Admitting: Cardiovascular Disease

## 2014-02-26 ENCOUNTER — Encounter: Payer: Self-pay | Admitting: *Deleted

## 2014-02-26 VITALS — BP 132/62 | HR 76 | Ht 65.0 in | Wt 153.0 lb

## 2014-02-26 DIAGNOSIS — I447 Left bundle-branch block, unspecified: Secondary | ICD-10-CM | POA: Diagnosis not present

## 2014-02-26 DIAGNOSIS — I2510 Atherosclerotic heart disease of native coronary artery without angina pectoris: Secondary | ICD-10-CM

## 2014-02-26 DIAGNOSIS — I340 Nonrheumatic mitral (valve) insufficiency: Secondary | ICD-10-CM

## 2014-02-26 DIAGNOSIS — I255 Ischemic cardiomyopathy: Secondary | ICD-10-CM | POA: Diagnosis not present

## 2014-02-26 NOTE — Progress Notes (Signed)
History of Present Illness: 81 yo female with history of CAD, ischemic cardiomyopathy, LBBB, moderate mitral regurgitation here today for cardiac follow up. She was admitted to [facility] November 2015 with a NSTEMI. Cardiac cath 12/28/13 per Dr. Claiborne Billings with severe calcific disease in the proximal and mid LAD. LVEF noted to be 10%. She refused CABG. I performed a rotablator atherectomy of the LAD on 12/30/13 with Impella LV assist support. A 2.75 x 38 mm Promus Premier DES was deployed in the mid LAD. A 3.0 x 16 mm Promus Premier DES was deployed in the proximal LAD. The distal stented segment was post-dilated with a 2.75 x 20 mm Spicer balloon x 2. A 3.25 x 12 mm [place] balloon was used to post-dilate the proximal stented segment. The stenosis was taken from 95% down to 0%. She did develop a right groin pseudoaneurysm. She did well overall and was seen in f/u by Kerin Ransom, PA-C on 01/25/14. Of note, pt is a DNR and refused to wear a Lifevest following discharge.   She is here today for follow up. She is feeling well. No chest pain or SOB. No LE edema. She does have daily back pain. Dry cough getting better off of Lisinopril.   Primary Care Physician:  Last Lipid Profile:Lipid Panel     Component Value Date/Time   CHOL 158 12/27/2013 0215   TRIG 87 12/27/2013 0215   HDL 59 12/27/2013 0215   CHOLHDL 2.7 12/27/2013 0215   VLDL 17 12/27/2013 0215   LDLCALC 82 12/27/2013 0215     Past Medical History  Diagnosis Date  . PONV (postoperative nausea and vomiting)   . Asthma   . Upper airway cough syndrome     Post-nasal drip  . Arthritis     "mostly my hands" (12/25/2013)  . Ischemic cardiomyopathy     a. EF 15%    Past Surgical History  Procedure Laterality Date  . Replacement total knee bilateral Bilateral 2009  . Appendectomy  1960's  . Cystocele repair  12/18/2011    Procedure: ANTERIOR REPAIR (CYSTOCELE);  Surgeon: Gus Height, MD;  Location: [facility];  Service: Gynecology;  Laterality: N/A;    . Joint replacement    . Joint replacement Right 1980's    "ring finger"  . Cardiac catheterization  12/28/2013    Procedure: RIGHT/LEFT HEART CATH AND CORONARY ANGIOGRAPHY;  Surgeon: Troy Sine, MD;  Location: [facility] C[facility] LAB;  Service: Cardiovascular;;  . Percutaneous coronary stent intervention (pci-s) N/A 12/30/2013    Procedure: PERCUTANEOUS CORONARY STENT INTERVENTION (PCI-S);  Surgeon: Burnell Blanks, MD;  Location: [facility] CATH LAB;  Service: Cardiovascular;  Laterality: N/A;    Current Outpatient Prescriptions  Medication Sig Dispense Refill  . aspirin EC 81 MG EC tablet Take 1 tablet (81 mg total) by mouth daily.    Marland Kitchen atorvastatin (LIPITOR) 40 MG tablet Take 1 tablet (40 mg total) by mouth daily at 6 PM. 30 tablet 5  . budesonide-formoterol (SYMBICORT) 160-4.5 MCG/ACT inhaler Inhale 2 puffs into the lungs 2 (two) times daily. (Patient taking differently: Inhale 2 puffs into the lungs 2 (two) times daily as needed (shortness of breath). ) 1 Inhaler 6  . carvedilol (COREG) 6.25 MG tablet Take 1 tablet (6.25 mg total) by mouth 2 (two) times daily with a meal. 60 tablet 5  . clopidogrel (PLAVIX) 75 MG tablet Take 1 tablet (75 mg total) by mouth daily with breakfast. 30 tablet 11  . DUREZOL 0.05 % EMUL  1  . fluticasone (FLONASE) 50 MCG/ACT nasal spray Place 2 sprays into both nostrils daily. (Patient taking differently: Place 2 sprays into both nostrils daily as needed. ) 16 g 2  . furosemide (LASIX) 40 MG tablet Take 1 tablet (40 mg total) by mouth daily. 30 tablet 5  . ILEVRO 0.3 % SUSP   1  . losartan (COZAAR) 25 MG tablet Take 1 tablet (25 mg total) by mouth daily. 30 tablet 5  . meloxicam (MOBIC) 7.5 MG tablet Take 7.5 mg by mouth 3 (three) times daily as needed.  1  . pantoprazole (PROTONIX) 40 MG tablet Take 1 tablet (40 mg total) by mouth daily. 30 tablet 5  . spironolactone (ALDACTONE) 25 MG tablet Take 0.5 tablets (12.5 mg total) by mouth daily. 30 tablet 5   No current  facility-administered medications for this visit.    No Known Allergies  History   Social History  . Marital Status: Widowed    Spouse Name: N/A    Number of Children: N/A  . Years of Education: N/A   Occupational History  . Not on file.   Social History Main Topics  . Smoking status: Never Smoker   . Smokeless tobacco: Never Used  . Alcohol Use: No  . Drug Use: No  . Sexual Activity: Not on file   Other Topics Concern  . Not on file   Social History Narrative    Family History  Problem Relation Age of Onset  . Cerebral aneurysm Mother     died at age 54  . Healthy Father     died of old age    Review of Systems:  As stated in the HPI and otherwise negative.   BP 132/62 mmHg  Pulse 76  Ht 5\' 5"  (1.651 m)  Wt 153 lb (69.4 kg)  BMI 25.46 kg/m2  SpO2 96%  Physical Examination: General: Well developed, well nourished, NAD HEENT: OP clear, mucus membranes moist SKIN: warm, dry. No rashes. Neuro: No focal deficits Musculoskeletal: Muscle strength 5/5 all ext Psychiatric: Mood and affect normal Neck: No JVD, no carotid bruits, no thyromegaly, no lymphadenopathy. Lungs:Clear bilaterally, no wheezes, rhonci, crackles Cardiovascular: Regular rate and rhythm. Systolic murmur noted. No gallops or rubs. Abdomen:Soft. Bowel sounds present. Non-tender.  Extremities: No lower extremity edema. Pulses are 2 + in the bilateral DP/PT.   Assessment and Plan:   1. CAD: s/p recent NSTEMI with complex PCI of the LAD with rotablator atherectomy. She is on ASA, Plavix, statin, beta blocker and Ace-inh.   2. Ischemic cardiomyopathy: LVEF=10%. She is on good medical therapy. Repeat echo 04/13/14.   3. Chronic systolic CHF: Volume status is stable on Lasix.   4. LBBB: Chronic  5. Mitral regurgitation: Moderate by echo November 2015.

## 2014-02-26 NOTE — Patient Instructions (Addendum)
Your physician recommends that you schedule a follow-up appointment in:  3 months. Scheduled for Jun 16, 2014 at 8:30

## 2014-03-12 DIAGNOSIS — J0100 Acute maxillary sinusitis, unspecified: Secondary | ICD-10-CM | POA: Diagnosis not present

## 2014-03-23 DIAGNOSIS — M25512 Pain in left shoulder: Secondary | ICD-10-CM | POA: Diagnosis not present

## 2014-03-23 DIAGNOSIS — M25511 Pain in right shoulder: Secondary | ICD-10-CM | POA: Diagnosis not present

## 2014-04-13 ENCOUNTER — Ambulatory Visit (HOSPITAL_COMMUNITY): Payer: Medicare Other | Attending: Cardiology

## 2014-04-13 DIAGNOSIS — I429 Cardiomyopathy, unspecified: Secondary | ICD-10-CM | POA: Diagnosis not present

## 2014-04-13 NOTE — Progress Notes (Signed)
2D Echo completed. 04/13/2014 

## 2014-04-15 ENCOUNTER — Telehealth: Payer: Self-pay | Admitting: Cardiovascular Disease

## 2014-04-15 NOTE — Telephone Encounter (Signed)
Left message to call back  

## 2014-04-15 NOTE — Telephone Encounter (Signed)
New Msg        Pt calling to get results of echo.    Please return call.

## 2014-06-15 NOTE — Progress Notes (Signed)
Chief Complaint  Patient presents with  . Coronary Artery Disease   History of Present Illness: 81 yo female with history of CAD, ischemic cardiomyopathy, LBBB, moderate mitral regurgitation here today for cardiac follow up. She was admitted to [facility] November 2015 with a NSTEMI. Cardiac cath 12/28/13 per Dr. Claiborne Billings with severe calcific disease in the proximal and mid LAD. LVEF noted to be 10%. She refused CABG. I performed a rotablator atherectomy of the LAD on 12/30/13 with Impella LV assist support. A 2.75 x 38 mm Promus Premier DES was deployed in the mid LAD. A 3.0 x 16 mm Promus Premier DES was deployed in the proximal LAD. The distal stented segment was post-dilated with a 2.75 x 20 mm [place] balloon x 2. A 3.25 x 12 mm [place] balloon was used to post-dilate the proximal stented segment. The stenosis was taken from 95% down to 0%. She did develop a right groin pseudoaneurysm. She did well overall and was seen in f/u by Kerin Ransom, PA-C on 01/25/14. Of note, pt is a DNR and refused to wear a Lifevest following discharge. Echo 04/13/14 with LVEF=30-35% with mild MR, mild to moderate AS.   She is here today for follow up. She is feeling well. No chest pain or SOB. No LE edema. She is back to normal activities and is not limited in any way. She is going on a cruise next week.    Past Medical History  Diagnosis Date  . PONV (postoperative nausea and vomiting)   . Asthma   . Upper airway cough syndrome     Post-nasal drip  . Arthritis     "mostly my hands" (12/25/2013)  . Ischemic cardiomyopathy     a. EF 15%    Past Surgical History  Procedure Laterality Date  . Replacement total knee bilateral Bilateral 2009  . Appendectomy  1960's  . Cystocele repair  12/18/2011    Procedure: ANTERIOR REPAIR (CYSTOCELE);  Surgeon: Gus Height, MD;  Location: [facility];  Service: Gynecology;  Laterality: N/A;  . Joint replacement    . Joint replacement Right 1980's    "ring finger"  . Cardiac catheterization   12/28/2013    Procedure: RIGHT/LEFT HEART CATH AND CORONARY ANGIOGRAPHY;  Surgeon: Troy Sine, MD;  Location: [facility] CATH LAB;  Service: Cardiovascular;;  . Percutaneous coronary stent intervention (pci-s) N/A 12/30/2013    Procedure: PERCUTANEOUS CORONARY STENT INTERVENTION (PCI-S);  Surgeon: Burnell Blanks, MD;  Location: [facility] CATH LAB;  Service: Cardiovascular;  Laterality: N/A;    Current Outpatient Prescriptions  Medication Sig Dispense Refill  . aspirin EC 81 MG EC tablet Take 1 tablet (81 mg total) by mouth daily.    Marland Kitchen atorvastatin (LIPITOR) 40 MG tablet Take 1 tablet (40 mg total) by mouth daily at 6 PM. 30 tablet 5  . carvedilol (COREG) 6.25 MG tablet Take 1 tablet (6.25 mg total) by mouth 2 (two) times daily with a meal. 60 tablet 5  . clopidogrel (PLAVIX) 75 MG tablet Take 1 tablet (75 mg total) by mouth daily with breakfast. 30 tablet 11  . furosemide (LASIX) 40 MG tablet Take 1 tablet (40 mg total) by mouth daily. 30 tablet 5  . ILEVRO 0.3 % SUSP   1  . losartan (COZAAR) 25 MG tablet Take 1 tablet (25 mg total) by mouth daily. 30 tablet 5  . meloxicam (MOBIC) 7.5 MG tablet Take 7.5 mg by mouth 3 (three) times daily as needed (for pain).  1  . pantoprazole (PROTONIX) 40 MG tablet Take 1 tablet (40 mg total) by mouth daily. 30 tablet 5  . spironolactone (ALDACTONE) 25 MG tablet Take 12.5 mg by mouth 2 (two) times daily.     No current facility-administered medications for this visit.    Allergies  Allergen Reactions  . Lisinopril Cough    History   Social History  . Marital Status: Widowed    Spouse Name: N/A  . Number of Children: N/A  . Years of Education: N/A   Occupational History  . Not on file.   Social History Main Topics  . Smoking status: Never Smoker   . Smokeless tobacco: Never Used  . Alcohol Use: No  . Drug Use: No  . Sexual Activity: Not on file   Other Topics Concern  . Not on file   Social History Narrative    Family History    Problem Relation Age of Onset  . Cerebral aneurysm Mother     died at age 66  . Healthy Father     died of old age    Review of Systems:  As stated in the HPI and otherwise negative.   BP 110/48 mmHg  Pulse 64  Ht 5\' 5"  (1.651 m)  Wt 156 lb (70.761 kg)  BMI 25.96 kg/m2  Physical Examination: General: Well developed, well nourished, NAD HEENT: OP clear, mucus membranes moist SKIN: warm, dry. No rashes. Neuro: No focal deficits Musculoskeletal: Muscle strength 5/5 all ext Psychiatric: Mood and affect normal Neck: No JVD, no carotid bruits, no thyromegaly, no lymphadenopathy. Lungs:Clear bilaterally, no wheezes, rhonci, crackles Cardiovascular: Regular rate and rhythm. Systolic murmur noted. No gallops or rubs. Abdomen:Soft. Bowel sounds present. Non-tender.  Extremities: No lower extremity edema. Pulses are 2 + in the bilateral DP/PT.  Echo 04/13/14: Left ventricle: Marded lateral and septal wall dysynergy Inferior septal and apical hypokinesis. The cavity size was severely dilated. Wall thickness was increased in a pattern of severe LVH. Systolic function was moderately to severely reduced. The estimated ejection fraction was in the range of 30% to 35%. Doppler parameters are consistent with abnormal left ventricular relaxation (grade 1 diastolic dysfunction). - Aortic valve: Given degree of LV dysfunction AS likely mild to moderae There was trivial regurgitation. Valve area (VTI): 1.18 cm^2. Valve area (Vmean): 1.13 cm^2. - Mitral valve: Calcified annulus. Mildly thickened leaflets . There was mild regurgitation. - Left atrium: The atrium was moderately dilated. - Atrial septum: No defect or patent foramen ovale was identified.  EKG:  EKG is not ordered today. The ekg ordered today demonstrates   Recent Labs: 12/26/2013: Pro B Natriuretic peptide (BNP) 3271.0* 12/27/2013: ALT 37* 12/30/2013: Magnesium 2.0 01/25/2014: BUN 24*; Creatinine 0.7;  Hemoglobin 10.9*; Platelets 181.0; Potassium 4.3; Sodium 142   Lipid Panel    Component Value Date/Time   CHOL 158 12/27/2013 0215   TRIG 87 12/27/2013 0215   HDL 59 12/27/2013 0215   CHOLHDL 2.7 12/27/2013 0215   VLDL 17 12/27/2013 0215   LDLCALC 82 12/27/2013 0215     Wt Readings from Last 3 Encounters:  06/16/14 156 lb (70.761 kg)  02/26/14 153 lb (69.4 kg)  01/25/14 152 lb 1.9 oz (69.001 kg)     Other studies Reviewed: Additional studies/ records that were reviewed today include: . Review of the above records demonstrates:    Assessment and Plan:   1. CAD: s/p recent NSTEMI with complex PCI of the LAD with rotablator atherectomy December 2015. She is on  ASA, Plavix, statin, beta blocker and Ace-inh.   2. Ischemic cardiomyopathy: LVEF=30-35% post PCI. She is on good medical therapy.   3. Chronic systolic CHF: Volume status is stable on Lasix.   4. LBBB: Chronic  5. Mitral regurgitation: Moderate by echo November 2015. Will follow  Current medicines are reviewed at length with the patient today.  The patient does not have concerns regarding medicines.  The following changes have been made:  no change  Labs/ tests ordered today include:  No orders of the defined types were placed in this encounter.    Disposition:   FU with me in 6 months  Signed, Lauree Chandler, MD 06/16/2014 10:36 AM    [facility] [place], [place], [place]  [address] Phone: [phone]; Fax: [phone]

## 2014-06-16 ENCOUNTER — Encounter: Payer: Self-pay | Admitting: Cardiovascular Disease

## 2014-06-16 ENCOUNTER — Ambulatory Visit (INDEPENDENT_AMBULATORY_CARE_PROVIDER_SITE_OTHER): Payer: Medicare Other | Admitting: Cardiovascular Disease

## 2014-06-16 VITALS — BP 110/48 | HR 64 | Ht 65.0 in | Wt 156.0 lb

## 2014-06-16 DIAGNOSIS — I447 Left bundle-branch block, unspecified: Secondary | ICD-10-CM | POA: Diagnosis not present

## 2014-06-16 DIAGNOSIS — M25511 Pain in right shoulder: Secondary | ICD-10-CM | POA: Diagnosis not present

## 2014-06-16 DIAGNOSIS — I2510 Atherosclerotic heart disease of native coronary artery without angina pectoris: Secondary | ICD-10-CM

## 2014-06-16 DIAGNOSIS — I255 Ischemic cardiomyopathy: Secondary | ICD-10-CM

## 2014-06-16 DIAGNOSIS — I340 Nonrheumatic mitral (valve) insufficiency: Secondary | ICD-10-CM

## 2014-06-16 DIAGNOSIS — M25512 Pain in left shoulder: Secondary | ICD-10-CM | POA: Diagnosis not present

## 2014-06-16 NOTE — Patient Instructions (Signed)
Medication Instructions:  Your physician recommends that you continue on your current medications as directed. Please refer to the Current Medication list given to you today.   Labwork: none  Testing/Procedures: none  Follow-Up: Your physician wants you to follow-up in: 6 months.  You will receive a reminder letter in the mail two months in advance. If you don't receive a letter, please call our office to schedule the follow-up appointment.       

## 2014-06-25 ENCOUNTER — Other Ambulatory Visit: Payer: Self-pay

## 2014-06-25 DIAGNOSIS — I255 Ischemic cardiomyopathy: Secondary | ICD-10-CM

## 2014-06-25 MED ORDER — CLOPIDOGREL BISULFATE 75 MG PO TABS: 75.0000 mg | ORAL_TABLET | Freq: Every day | ORAL | Status: DC

## 2014-06-25 MED ORDER — ATORVASTATIN CALCIUM 40 MG PO TABS: 40.0000 mg | ORAL_TABLET | Freq: Every day | ORAL | Status: DC

## 2014-06-25 MED ORDER — SPIRONOLACTONE 25 MG PO TABS: 12.5000 mg | ORAL_TABLET | Freq: Two times a day (BID) | ORAL | Status: DC

## 2014-06-25 MED ORDER — CARVEDILOL 6.25 MG PO TABS: 6.2500 mg | ORAL_TABLET | Freq: Two times a day (BID) | ORAL | Status: DC

## 2014-06-25 MED ORDER — LOSARTAN POTASSIUM 25 MG PO TABS: 25.0000 mg | ORAL_TABLET | Freq: Every day | ORAL | Status: DC

## 2014-06-25 MED ORDER — FUROSEMIDE 40 MG PO TABS: 40.0000 mg | ORAL_TABLET | Freq: Every day | ORAL | Status: DC

## 2014-06-25 NOTE — Telephone Encounter (Signed)
Per note 5.18.16

## 2014-07-06 ENCOUNTER — Other Ambulatory Visit: Payer: Self-pay | Admitting: Cardiology

## 2014-07-07 ENCOUNTER — Other Ambulatory Visit: Payer: Self-pay

## 2014-07-07 ENCOUNTER — Telehealth: Payer: Self-pay | Admitting: Cardiovascular Disease

## 2014-07-07 DIAGNOSIS — I255 Ischemic cardiomyopathy: Secondary | ICD-10-CM

## 2014-07-07 MED ORDER — LOSARTAN POTASSIUM 25 MG PO TABS: 25.0000 mg | ORAL_TABLET | Freq: Every day | ORAL | Status: DC

## 2014-07-07 NOTE — Telephone Encounter (Signed)
[facility] states they did not receive the refill authorization for this patient's [place] that was sent 06/25/14.  Please resend.

## 2014-09-07 DIAGNOSIS — H02839 Dermatochalasis of unspecified eye, unspecified eyelid: Secondary | ICD-10-CM | POA: Diagnosis not present

## 2014-09-07 DIAGNOSIS — H2512 Age-related nuclear cataract, left eye: Secondary | ICD-10-CM | POA: Diagnosis not present

## 2014-09-07 DIAGNOSIS — H2511 Age-related nuclear cataract, right eye: Secondary | ICD-10-CM | POA: Diagnosis not present

## 2014-09-07 DIAGNOSIS — H18411 Arcus senilis, right eye: Secondary | ICD-10-CM | POA: Diagnosis not present

## 2014-09-16 DIAGNOSIS — M25511 Pain in right shoulder: Secondary | ICD-10-CM | POA: Diagnosis not present

## 2014-09-16 DIAGNOSIS — M25512 Pain in left shoulder: Secondary | ICD-10-CM | POA: Diagnosis not present

## 2014-09-20 DIAGNOSIS — H25811 Combined forms of age-related cataract, right eye: Secondary | ICD-10-CM | POA: Diagnosis not present

## 2014-09-20 DIAGNOSIS — H2511 Age-related nuclear cataract, right eye: Secondary | ICD-10-CM | POA: Diagnosis not present

## 2014-09-21 DIAGNOSIS — H25012 Cortical age-related cataract, left eye: Secondary | ICD-10-CM | POA: Diagnosis not present

## 2014-09-21 DIAGNOSIS — H2512 Age-related nuclear cataract, left eye: Secondary | ICD-10-CM | POA: Diagnosis not present

## 2014-10-01 DIAGNOSIS — H2512 Age-related nuclear cataract, left eye: Secondary | ICD-10-CM | POA: Diagnosis not present

## 2014-10-01 DIAGNOSIS — H25812 Combined forms of age-related cataract, left eye: Secondary | ICD-10-CM | POA: Diagnosis not present

## 2014-10-07 DIAGNOSIS — R05 Cough: Secondary | ICD-10-CM | POA: Diagnosis not present

## 2014-10-07 DIAGNOSIS — H6123 Impacted cerumen, bilateral: Secondary | ICD-10-CM | POA: Diagnosis not present

## 2014-10-07 DIAGNOSIS — J0100 Acute maxillary sinusitis, unspecified: Secondary | ICD-10-CM | POA: Diagnosis not present

## 2014-10-13 ENCOUNTER — Telehealth: Payer: Self-pay | Admitting: *Deleted

## 2014-10-13 NOTE — Telephone Encounter (Signed)
Pt left only name and [phone].   I called pt and she said she wanted to make an appt with Dr. Paulla Dolly, transferred pt to schedulers.

## 2014-10-18 ENCOUNTER — Ambulatory Visit (INDEPENDENT_AMBULATORY_CARE_PROVIDER_SITE_OTHER): Payer: Medicare Other | Admitting: Podiatry

## 2014-10-18 ENCOUNTER — Encounter: Payer: Self-pay | Admitting: Podiatry

## 2014-10-18 VITALS — BP 110/50

## 2014-10-18 DIAGNOSIS — B351 Tinea unguium: Secondary | ICD-10-CM

## 2014-10-18 DIAGNOSIS — M79673 Pain in unspecified foot: Secondary | ICD-10-CM

## 2014-10-18 NOTE — Progress Notes (Signed)
HPI Presents today chief complaint of painful elongated toenails.  Objective: Pulses are palpable bilateral nails are thick, yellow dystrophic onychomycosis and painful palpation.   Assessment: Onychomycosis with pain in limb.  Plan: Treatment of nails in thickness and length as covered service secondary to pain.  

## 2014-10-20 NOTE — Progress Notes (Signed)
Subjective:     Patient ID: Beth Johnston, female   DOB: Jun 14, 1925, 81 y.o.   MRN: [id number]  HPI patient presents with thick nails 1-5 both feet but states they're not currently sore she just cannot cut them   Review of Systems     Objective:   Physical Exam Neurovascular status unchanged with thick yellow brittle nailbeds 1-5 both feet nonpainful    Assessment:     Mycotic nail infection    Plan:     Debride nailbeds 1-5 both feet with no iatrogenic bleeding noted

## 2014-10-21 DIAGNOSIS — L821 Other seborrheic keratosis: Secondary | ICD-10-CM | POA: Diagnosis not present

## 2014-10-21 DIAGNOSIS — L570 Actinic keratosis: Secondary | ICD-10-CM | POA: Diagnosis not present

## 2014-12-08 DIAGNOSIS — M25511 Pain in right shoulder: Secondary | ICD-10-CM | POA: Diagnosis not present

## 2014-12-08 DIAGNOSIS — M25512 Pain in left shoulder: Secondary | ICD-10-CM | POA: Diagnosis not present

## 2014-12-28 NOTE — Progress Notes (Signed)
Chief Complaint  Patient presents with  . Follow-up   History of Present Illness: 81 yo female with history of CAD, ischemic cardiomyopathy, LBBB, moderate mitral regurgitation here today for cardiac follow up. She was admitted to [facility] November 2015 with a NSTEMI. Cardiac cath 12/28/13 per Dr. Claiborne Billings with severe calcific disease in the proximal and mid LAD. LVEF noted to be 10%. She refused CABG. I performed a rotablator atherectomy of the LAD on 12/30/13 with Impella LV assist support. A 2.75 x 38 mm Promus Premier DES was deployed in the mid LAD. A 3.0 x 16 mm Promus Premier DES was deployed in the proximal LAD. The distal stented segment was post-dilated with a 2.75 x 20 mm [place] balloon x 2. A 3.25 x 12 mm [place] balloon was used to post-dilate the proximal stented segment. The stenosis was taken from 95% down to 0%. She did develop a right groin pseudoaneurysm. She did well overall and was seen in f/u by Kerin Ransom, PA-C on 01/25/14. Of note, pt is a DNR and refused to wear a Lifevest following discharge. Echo 04/13/14 with LVEF=30-35% with mild MR, mild to moderate AS.   She is here today for follow up. She is feeling well. No chest pain or SOB. No LE edema. She is very active, still working. She just got back from a cruise.   Primary care provider: None  Past Medical History  Diagnosis Date  . PONV (postoperative nausea and vomiting)   . Asthma   . Upper airway cough syndrome     Post-nasal drip  . Arthritis     "mostly my hands" (12/25/2013)  . Ischemic cardiomyopathy     a. EF 15%    Past Surgical History  Procedure Laterality Date  . Replacement total knee bilateral Bilateral 2009  . Appendectomy  1960's  . Cystocele repair  12/18/2011    Procedure: ANTERIOR REPAIR (CYSTOCELE);  Surgeon: Gus Height, MD;  Location: [facility];  Service: Gynecology;  Laterality: N/A;  . Joint replacement    . Joint replacement Right 1980's    "ring finger"  . Cardiac catheterization  12/28/2013   Procedure: RIGHT/LEFT HEART CATH AND CORONARY ANGIOGRAPHY;  Surgeon: Troy Sine, MD;  Location: [facility] CATH LAB;  Service: Cardiovascular;;  . Percutaneous coronary stent intervention (pci-s) N/A 12/30/2013    Procedure: PERCUTANEOUS CORONARY STENT INTERVENTION (PCI-S);  Surgeon: Burnell Blanks, MD;  Location: [facility] CATH LAB;  Service: Cardiovascular;  Laterality: N/A;    Current Outpatient Prescriptions  Medication Sig Dispense Refill  . aspirin EC 81 MG EC tablet Take 1 tablet (81 mg total) by mouth daily.    Marland Kitchen atorvastatin (LIPITOR) 40 MG tablet Take 1 tablet (40 mg total) by mouth daily at 6 PM. 30 tablet 6  . carvedilol (COREG) 6.25 MG tablet Take 1 tablet (6.25 mg total) by mouth 2 (two) times daily with a meal. 60 tablet 6  . clopidogrel (PLAVIX) 75 MG tablet Take 1 tablet (75 mg total) by mouth daily with breakfast. 30 tablet 6  . furosemide (LASIX) 40 MG tablet Take 1 tablet (40 mg total) by mouth daily. 30 tablet 6  . ILEVRO 0.3 % SUSP   1  . losartan (COZAAR) 25 MG tablet Take 1 tablet (25 mg total) by mouth daily. 30 tablet 6  . meloxicam (MOBIC) 7.5 MG tablet Take 7.5 mg by mouth 3 (three) times daily as needed (for pain).   1  . pantoprazole (PROTONIX) 40 MG tablet  Take 1 tablet (40 mg total) by mouth daily. 30 tablet 5  . spironolactone (ALDACTONE) 25 MG tablet Take 0.5 tablets (12.5 mg total) by mouth 2 (two) times daily. 30 tablet 6   No current facility-administered medications for this visit.    Allergies  Allergen Reactions  . Lisinopril Cough    Social History   Social History  . Marital Status: Widowed    Spouse Name: N/A  . Number of Children: N/A  . Years of Education: N/A   Occupational History  . Not on file.   Social History Main Topics  . Smoking status: Never Smoker   . Smokeless tobacco: Never Used  . Alcohol Use: No  . Drug Use: No  . Sexual Activity: Not on file   Other Topics Concern  . Not on file   Social History Narrative     Family History  Problem Relation Age of Onset  . Cerebral aneurysm Mother     died at age 34  . Healthy Father     died of old age    Review of Systems:  As stated in the HPI and otherwise negative.   BP 120/56 mmHg  Pulse 76  Ht 5\' 5"  (1.651 m)  Wt 160 lb (72.576 kg)  BMI 26.63 kg/m2  Physical Examination: General: Well developed, well nourished, NAD HEENT: OP clear, mucus membranes moist SKIN: warm, dry. No rashes. Neuro: No focal deficits Musculoskeletal: Muscle strength 5/5 all ext Psychiatric: Mood and affect normal Neck: No JVD, no carotid bruits, no thyromegaly, no lymphadenopathy. Lungs:Clear bilaterally, no wheezes, rhonci, crackles Cardiovascular: Regular rate and rhythm. Systolic murmur noted. No gallops or rubs. Abdomen:Soft. Bowel sounds present. Non-tender.  Extremities: No lower extremity edema. Pulses are 2 + in the bilateral DP/PT.  Echo 04/13/14: Left ventricle: Marded lateral and septal wall dysynergy Inferior septal and apical hypokinesis. The cavity size was severely dilated. Wall thickness was increased in a pattern of severe LVH. Systolic function was moderately to severely reduced. The estimated ejection fraction was in the range of 30% to 35%. Doppler parameters are consistent with abnormal left ventricular relaxation (grade 1 diastolic dysfunction). - Aortic valve: Given degree of LV dysfunction AS likely mild to moderae There was trivial regurgitation. Valve area (VTI): 1.18 cm^2. Valve area (Vmean): 1.13 cm^2. - Mitral valve: Calcified annulus. Mildly thickened leaflets . There was mild regurgitation. - Left atrium: The atrium was moderately dilated. - Atrial septum: No defect or patent foramen ovale was identified.  EKG:  EKG is ordered today. The ekg ordered today demonstrates NSR,r ate 76 bpm. LBBB  Recent Labs: 12/30/2013: Magnesium 2.0 01/25/2014: BUN 24*; Creatinine, Ser 0.7; Hemoglobin 10.9*; Platelets 181.0;  Potassium 4.3; Sodium 142   Lipid Panel    Component Value Date/Time   CHOL 158 12/27/2013 0215   TRIG 87 12/27/2013 0215   HDL 59 12/27/2013 0215   CHOLHDL 2.7 12/27/2013 0215   VLDL 17 12/27/2013 0215   LDLCALC 82 12/27/2013 0215     Wt Readings from Last 3 Encounters:  12/29/14 160 lb (72.576 kg)  06/16/14 156 lb (70.761 kg)  02/26/14 153 lb (69.4 kg)     Other studies Reviewed: Additional studies/ records that were reviewed today include: . Review of the above records demonstrates:    Assessment and Plan:   1. CAD: s/p NSTEMI with complex PCI of the LAD with rotablator atherectomy December 2015. She is on ASA, Plavix, statin, beta blocker and Ace-inh.   2. Ischemic cardiomyopathy: LVEF=30-35%  post PCI. She is on good medical therapy.   3. Chronic systolic CHF: Volume status is stable on Lasix.   4. LBBB: Chronic  5. Mitral regurgitation: Moderate by echo March 2016. Will repeat in 6 months.   6. Aortic stenosis: Mild to moderate by echo March 2016. Repeat echo 6 months.   Current medicines are reviewed at length with the patient today.  The patient does not have concerns regarding medicines.  The following changes have been made:  no change  Labs/ tests ordered today include:   Orders Placed This Encounter  Procedures  . Basic Metabolic Panel (BMET)  . Lipid Profile  . Hepatic function panel  . CBC w/Diff  . EKG 12-Lead  . Echocardiogram    Disposition:   FU with me in 6 months  Signed, Lauree Chandler, MD 12/29/2014 3:51 PM    [facility] [place], [place], [place]  [address] Phone: [phone]; Fax: [phone]

## 2014-12-29 ENCOUNTER — Encounter: Payer: Self-pay | Admitting: Cardiovascular Disease

## 2014-12-29 ENCOUNTER — Ambulatory Visit (INDEPENDENT_AMBULATORY_CARE_PROVIDER_SITE_OTHER): Payer: Medicare Other | Admitting: Cardiovascular Disease

## 2014-12-29 VITALS — BP 120/56 | HR 76 | Ht 65.0 in | Wt 160.0 lb

## 2014-12-29 DIAGNOSIS — I350 Nonrheumatic aortic (valve) stenosis: Secondary | ICD-10-CM

## 2014-12-29 DIAGNOSIS — I5022 Chronic systolic (congestive) heart failure: Secondary | ICD-10-CM

## 2014-12-29 DIAGNOSIS — I447 Left bundle-branch block, unspecified: Secondary | ICD-10-CM | POA: Diagnosis not present

## 2014-12-29 DIAGNOSIS — I255 Ischemic cardiomyopathy: Secondary | ICD-10-CM

## 2014-12-29 DIAGNOSIS — I2510 Atherosclerotic heart disease of native coronary artery without angina pectoris: Secondary | ICD-10-CM

## 2014-12-29 DIAGNOSIS — I340 Nonrheumatic mitral (valve) insufficiency: Secondary | ICD-10-CM

## 2014-12-29 NOTE — Patient Instructions (Addendum)
Medication Instructions:  Your physician recommends that you continue on your current medications as directed. Please refer to the Current Medication list given to you today.   Labwork: Your physician recommends that you return for fasting lab work on December 6,2016.  The lab opens at 7:30 AM   Testing/Procedures: Your physician has requested that you have an echocardiogram. Echocardiography is a painless test that uses sound waves to create images of your heart. It provides your doctor with information about the size and shape of your heart and how well your heart's chambers and valves are working. This procedure takes approximately one hour. There are no restrictions for this procedure. To be done in 6 months--week or 2 prior to appt with Dr. Angelena Form    Follow-Up: Your physician wants you to follow-up in: 6 months.  You will receive a reminder letter in the mail two months in advance. If you don't receive a letter, please call our office to schedule the follow-up appointment.   Any Other Special Instructions Will Be Listed Below (If Applicable).     If you need a refill on your cardiac medications before your next appointment, please call your pharmacy.

## 2015-01-04 ENCOUNTER — Other Ambulatory Visit (INDEPENDENT_AMBULATORY_CARE_PROVIDER_SITE_OTHER): Payer: Medicare Other | Admitting: *Deleted

## 2015-01-04 DIAGNOSIS — I2510 Atherosclerotic heart disease of native coronary artery without angina pectoris: Secondary | ICD-10-CM

## 2015-01-04 DIAGNOSIS — I5022 Chronic systolic (congestive) heart failure: Secondary | ICD-10-CM

## 2015-01-04 DIAGNOSIS — I509 Heart failure, unspecified: Secondary | ICD-10-CM | POA: Diagnosis not present

## 2015-01-04 DIAGNOSIS — I340 Nonrheumatic mitral (valve) insufficiency: Secondary | ICD-10-CM

## 2015-01-04 DIAGNOSIS — I348 Other nonrheumatic mitral valve disorders: Secondary | ICD-10-CM | POA: Diagnosis not present

## 2015-01-04 LAB — CBC WITH DIFFERENTIAL/PLATELET
BASOS ABS: 0 10*3/uL (ref 0.0–0.1)
BASOS PCT: 1 % (ref 0–1)
Eosinophils Absolute: 0.3 10*3/uL (ref 0.0–0.7)
Eosinophils Relative: 6 % — ABNORMAL HIGH (ref 0–5)
HCT: 32.9 % — ABNORMAL LOW (ref 36.0–46.0)
HEMOGLOBIN: 10.7 g/dL — AB (ref 12.0–15.0)
LYMPHS ABS: 1.2 10*3/uL (ref 0.7–4.0)
Lymphocytes Relative: 29 % (ref 12–46)
MCH: 27.2 pg (ref 26.0–34.0)
MCHC: 32.5 g/dL (ref 30.0–36.0)
MCV: 83.7 fL (ref 78.0–100.0)
MONOS PCT: 14 % — AB (ref 3–12)
MPV: 9.8 fL (ref 8.6–12.4)
Monocytes Absolute: 0.6 10*3/uL (ref 0.1–1.0)
NEUTROS ABS: 2.2 10*3/uL (ref 1.7–7.7)
NEUTROS PCT: 50 % (ref 43–77)
Platelets: 206 10*3/uL (ref 150–400)
RBC: 3.93 MIL/uL (ref 3.87–5.11)
RDW: 13.6 % (ref 11.5–15.5)
WBC: 4.3 10*3/uL (ref 4.0–10.5)

## 2015-01-04 LAB — LIPID PANEL
Cholesterol: 126 mg/dL (ref 125–200)
HDL: 61 mg/dL (ref >=46)
LDL CALC: 53 mg/dL (ref <130)
Total CHOL/HDL Ratio: 2.1 Ratio (ref <=5.0)
Triglycerides: 60 mg/dL (ref <150)
VLDL: 12 mg/dL (ref <30)

## 2015-01-04 LAB — BASIC METABOLIC PANEL
BUN: 28 mg/dL — AB (ref 7–25)
CHLORIDE: 106 mmol/L (ref 98–110)
CO2: 26 mmol/L (ref 20–31)
CREATININE: 1.03 mg/dL — AB (ref 0.60–0.88)
Calcium: 9 mg/dL (ref 8.6–10.4)
GLUCOSE: 98 mg/dL (ref 65–99)
Potassium: 4.2 mmol/L (ref 3.5–5.3)
Sodium: 139 mmol/L (ref 135–146)

## 2015-01-04 LAB — HEPATIC FUNCTION PANEL
ALBUMIN: 3.9 g/dL (ref 3.6–5.1)
ALK PHOS: 54 U/L (ref 33–130)
ALT: 20 U/L (ref 6–29)
AST: 26 U/L (ref 10–35)
BILIRUBIN INDIRECT: 0.9 mg/dL (ref 0.2–1.2)
BILIRUBIN TOTAL: 1.1 mg/dL (ref 0.2–1.2)
Bilirubin, Direct: 0.2 mg/dL (ref <=0.2)
Total Protein: 6.4 g/dL (ref 6.1–8.1)

## 2015-01-06 ENCOUNTER — Telehealth: Payer: Self-pay | Admitting: Cardiovascular Disease

## 2015-01-06 NOTE — Telephone Encounter (Signed)
F/u  Pt returning RN phone call- ab work

## 2015-01-18 ENCOUNTER — Ambulatory Visit: Payer: Medicare Other | Admitting: Podiatry

## 2015-01-26 ENCOUNTER — Other Ambulatory Visit: Payer: Self-pay

## 2015-01-26 DIAGNOSIS — I255 Ischemic cardiomyopathy: Secondary | ICD-10-CM

## 2015-01-26 MED ORDER — LOSARTAN POTASSIUM 25 MG PO TABS: 25.0000 mg | ORAL_TABLET | Freq: Every day | ORAL | Status: DC

## 2015-01-26 NOTE — Telephone Encounter (Signed)
Beth Blanks, MD at 12/28/2014 2:22 PM  losartan (COZAAR) 25 MG tablet Take 1 tablet (25 mg total) by mouth daily Current medicines are reviewed at length with the patient today. The patient does not have concerns regarding medicines.  The following changes have been made: no change

## 2015-02-05 ENCOUNTER — Other Ambulatory Visit: Payer: Self-pay | Admitting: Cardiovascular Disease

## 2015-02-16 DIAGNOSIS — J209 Acute bronchitis, unspecified: Secondary | ICD-10-CM | POA: Diagnosis not present

## 2015-02-19 ENCOUNTER — Other Ambulatory Visit: Payer: Self-pay | Admitting: Cardiovascular Disease

## 2015-03-01 ENCOUNTER — Other Ambulatory Visit: Payer: Self-pay | Admitting: Cardiovascular Disease

## 2015-05-01 ENCOUNTER — Other Ambulatory Visit: Payer: Self-pay | Admitting: Cardiovascular Disease

## 2015-05-11 DIAGNOSIS — M25512 Pain in left shoulder: Secondary | ICD-10-CM | POA: Diagnosis not present

## 2015-05-11 DIAGNOSIS — M25511 Pain in right shoulder: Secondary | ICD-10-CM | POA: Diagnosis not present

## 2015-05-13 ENCOUNTER — Other Ambulatory Visit: Payer: Self-pay | Admitting: *Deleted

## 2015-05-13 MED ORDER — CARVEDILOL 6.25 MG PO TABS: ORAL_TABLET | ORAL | Status: DC

## 2015-05-23 ENCOUNTER — Other Ambulatory Visit: Payer: Self-pay | Admitting: *Deleted

## 2015-05-23 MED ORDER — FUROSEMIDE 40 MG PO TABS: 40.0000 mg | ORAL_TABLET | Freq: Every day | ORAL | Status: DC

## 2015-05-26 DIAGNOSIS — L570 Actinic keratosis: Secondary | ICD-10-CM | POA: Diagnosis not present

## 2015-05-26 DIAGNOSIS — L820 Inflamed seborrheic keratosis: Secondary | ICD-10-CM | POA: Diagnosis not present

## 2015-06-13 ENCOUNTER — Ambulatory Visit (HOSPITAL_COMMUNITY): Payer: Medicare Other | Attending: Cardiovascular Disease

## 2015-06-13 ENCOUNTER — Other Ambulatory Visit: Payer: Self-pay

## 2015-06-13 DIAGNOSIS — I350 Nonrheumatic aortic (valve) stenosis: Secondary | ICD-10-CM | POA: Diagnosis not present

## 2015-06-13 DIAGNOSIS — J45909 Unspecified asthma, uncomplicated: Secondary | ICD-10-CM | POA: Insufficient documentation

## 2015-06-13 DIAGNOSIS — I447 Left bundle-branch block, unspecified: Secondary | ICD-10-CM | POA: Insufficient documentation

## 2015-06-13 DIAGNOSIS — I255 Ischemic cardiomyopathy: Secondary | ICD-10-CM | POA: Diagnosis not present

## 2015-06-13 DIAGNOSIS — I2510 Atherosclerotic heart disease of native coronary artery without angina pectoris: Secondary | ICD-10-CM | POA: Diagnosis not present

## 2015-06-13 DIAGNOSIS — I340 Nonrheumatic mitral (valve) insufficiency: Secondary | ICD-10-CM | POA: Insufficient documentation

## 2015-06-13 DIAGNOSIS — I517 Cardiomegaly: Secondary | ICD-10-CM | POA: Insufficient documentation

## 2015-06-15 ENCOUNTER — Telehealth: Payer: Self-pay | Admitting: Cardiovascular Disease

## 2015-06-15 NOTE — Telephone Encounter (Signed)
Follow Up  ° °Pt called states that she is returning the call °

## 2015-06-15 NOTE — Telephone Encounter (Signed)
Spoke with pt and reviewed echo results with her.

## 2015-06-24 ENCOUNTER — Ambulatory Visit: Payer: Medicare Other | Admitting: Cardiovascular Disease

## 2015-07-01 ENCOUNTER — Other Ambulatory Visit: Payer: Self-pay | Admitting: Cardiovascular Disease

## 2015-07-01 ENCOUNTER — Other Ambulatory Visit: Payer: Self-pay | Admitting: *Deleted

## 2015-07-01 MED ORDER — SPIRONOLACTONE 25 MG PO TABS: 12.5000 mg | ORAL_TABLET | Freq: Two times a day (BID) | ORAL | Status: DC

## 2015-07-10 IMAGING — CT CT ABD-PELV W/O CM
2 of 4 series · 16 of 46 positions shown, 18 images · non-contrast
Comparison: None.

CLINICAL DATA: Recent heart catheterization. Complains of right
groin pain. Unable to raise the right leg or put weight on the right
leg.

EXAM:
CT ABDOMEN AND PELVIS WITHOUT CONTRAST
TECHNIQUE: Multidetector CT imaging of the abdomen and pelvis was performed
following the standard protocol without IV contrast.

[Series 2: abd/ pelvis 5.0 i30f 1 · axial · 0.74mm/px · z∈[+576,+1032]mm · 13 of 99 slices shown, 15 images]
[im 4/99  soft-tissue]
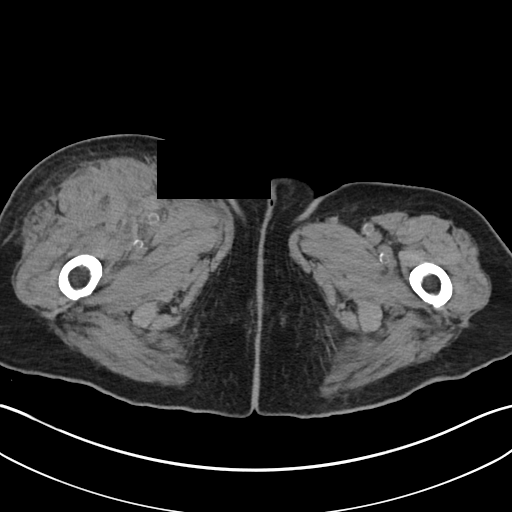
[im 4/99  bone]
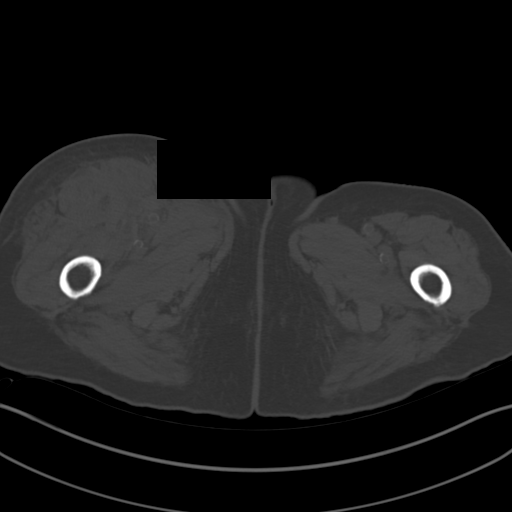
[im 12/99  soft-tissue]
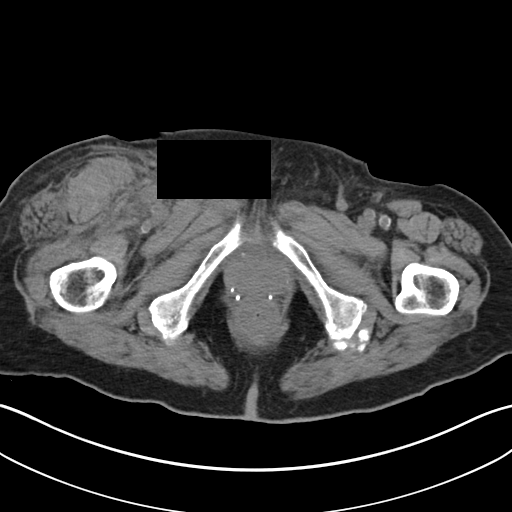
[im 19/99  soft-tissue]
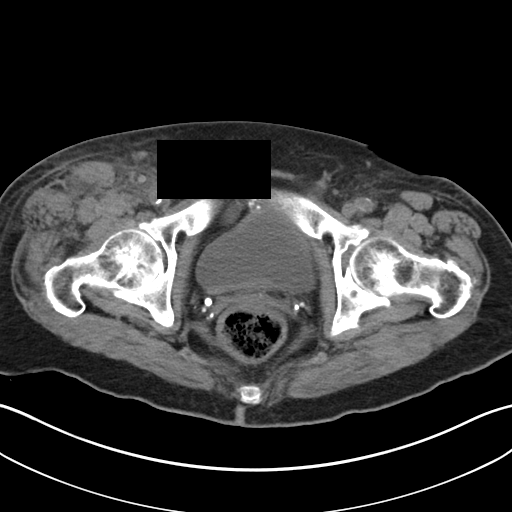
[im 27/99  soft-tissue]
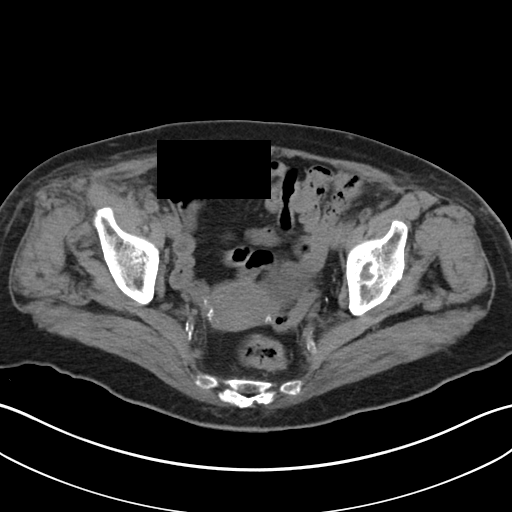
[im 34/99  soft-tissue]
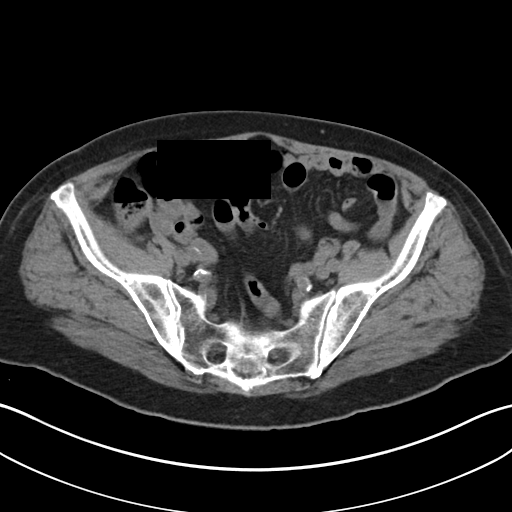
[im 42/99  soft-tissue]
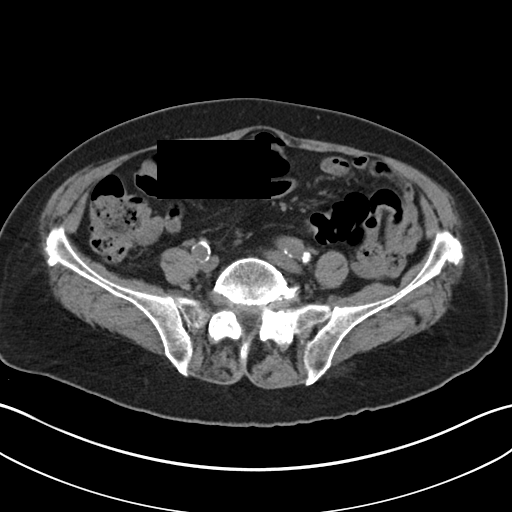
[im 50/99  soft-tissue]
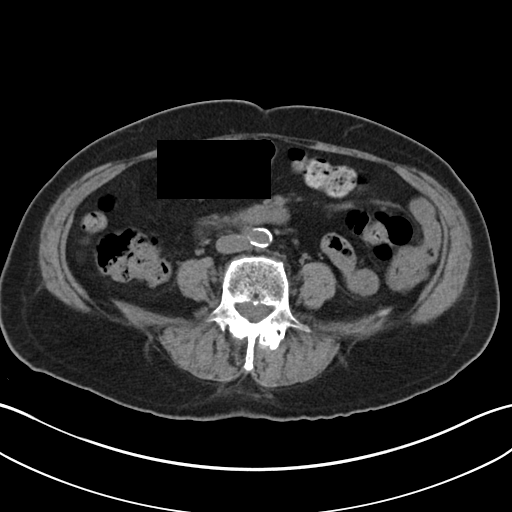
[im 57/99  soft-tissue]
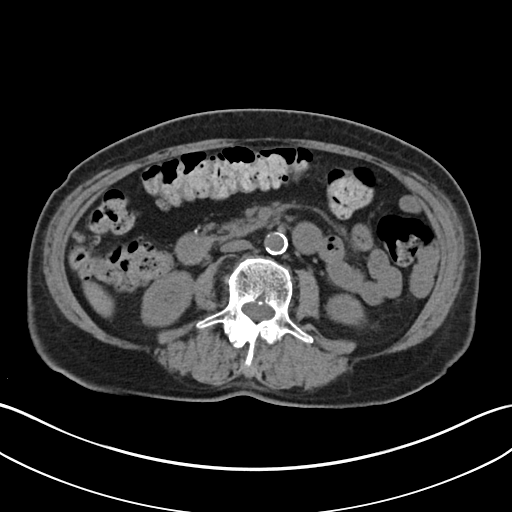
[im 65/99  soft-tissue]
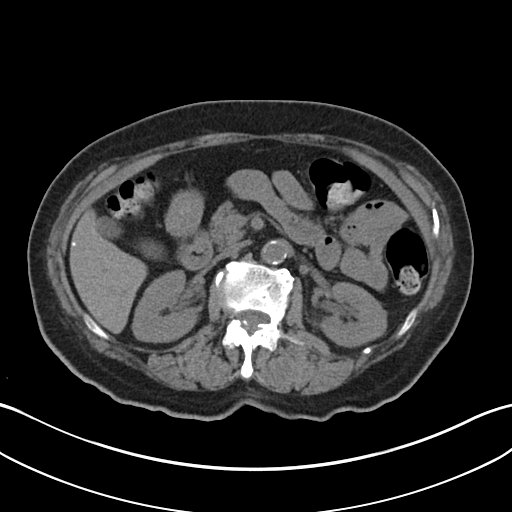
[im 65/99  bone]
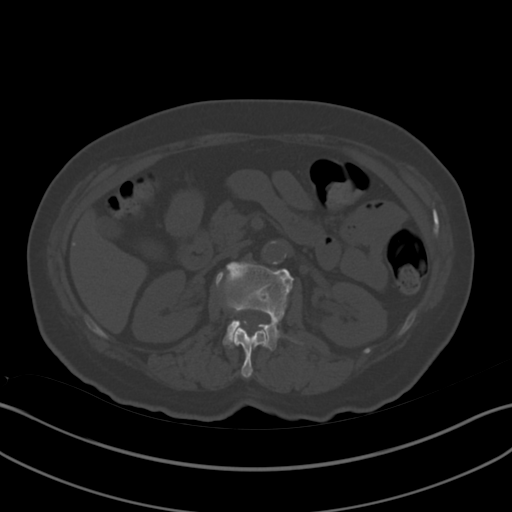
[im 72/99  soft-tissue]
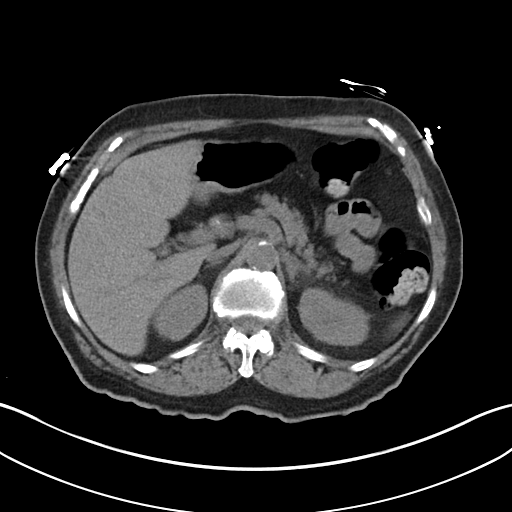
[im 80/99  soft-tissue]
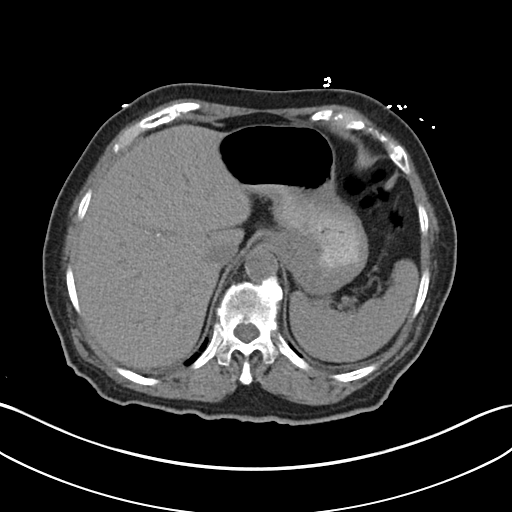
[im 87/99  soft-tissue]
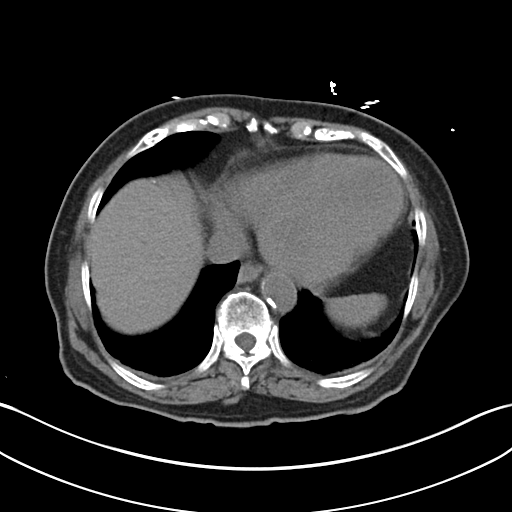
[im 95/99  soft-tissue]
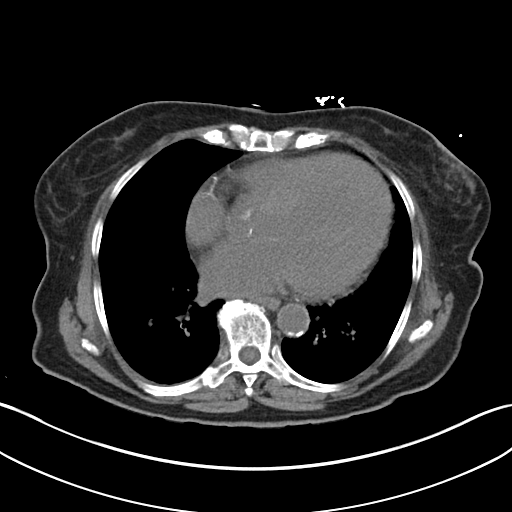

[Series 5: cor st · coronal · 0.79mm/px · 3 of 77 slices shown]
[im 26/77  soft-tissue]
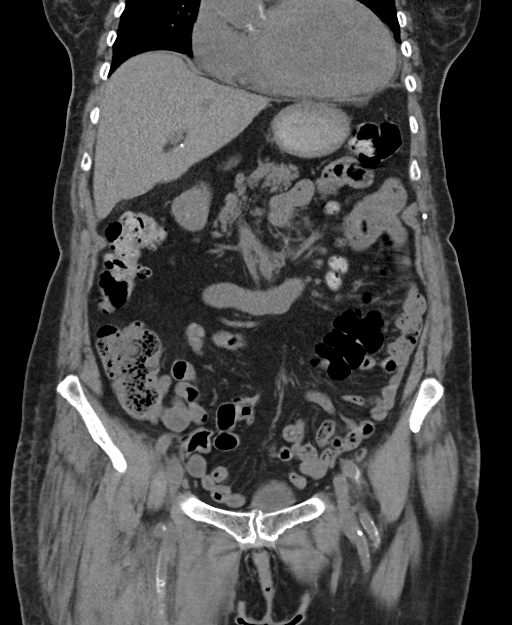
[im 34/77  soft-tissue]
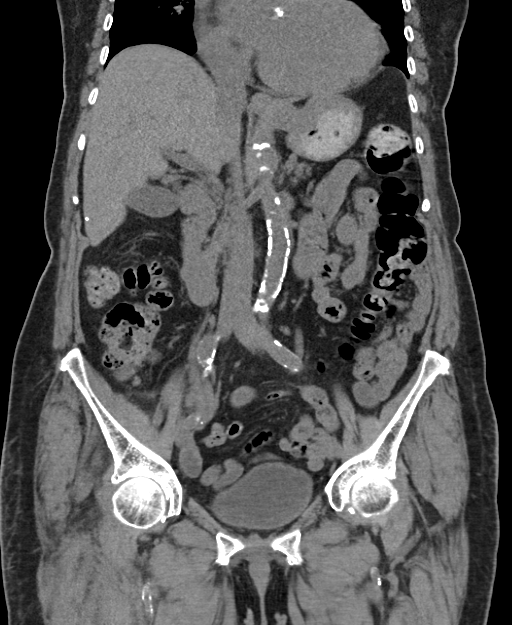
[im 43/77  soft-tissue]
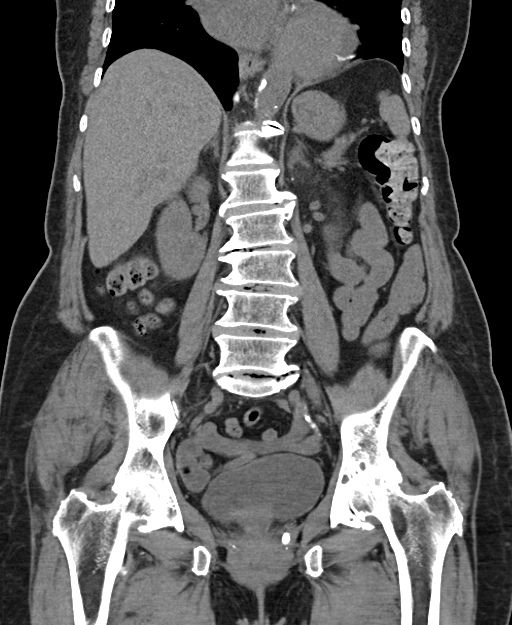

[16 of 46 positions shown; findings below may reference images not displayed]

FINDINGS: Lung bases are clear. No pleural effusions. Evidence for coronary
artery calcifications. Heart size is enlarged. Negative for free
intraperitoneal air.

There is soft tissue edema throughout the right groin surrounding
the right common femoral artery and proximal right femoral arteries.
There are scattered pockets of high density in the proximal right
thigh consistent with blood products. There is no evidence for a
retroperitoneal hematoma.

Scattered calcifications in the liver are compatible with old
granulomatous disease. No acute abnormality to the gallbladder.
Normal appearance of the spleen, pancreas and right adrenal gland.
Mild stranding around the left adrenal gland is nonspecific. Normal
appearance of both kidneys without stones or hydronephrosis. The
abdominal aorta is heavily calcified without dilatation. No
significant free fluid or lymphadenopathy. No gross abnormality to
the uterus or adnexal structures or urinary bladder.

Multilevel degenerative changes in the lumbar spine.
IMPRESSION: High-density material and stranding in the right groin and proximal
right thigh. Findings are consistent with blood products and
hematoma formation. Recommend an arterial duplex examination to
exclude a pseudo aneurysm. No retroperitoneal hematoma.

Mild stranding involving the left adrenal gland is nonspecific.
Unclear if this represents an acute finding versus chronic changes.
No evidence for an adrenal lesion.

## 2015-08-16 ENCOUNTER — Encounter: Payer: Self-pay | Admitting: *Deleted

## 2015-08-16 ENCOUNTER — Other Ambulatory Visit: Payer: Self-pay | Admitting: *Deleted

## 2015-08-20 ENCOUNTER — Other Ambulatory Visit: Payer: Self-pay | Admitting: Cardiovascular Disease

## 2015-08-29 DIAGNOSIS — H5203 Hypermetropia, bilateral: Secondary | ICD-10-CM | POA: Diagnosis not present

## 2015-08-29 DIAGNOSIS — H26492 Other secondary cataract, left eye: Secondary | ICD-10-CM | POA: Diagnosis not present

## 2015-08-29 DIAGNOSIS — H35031 Hypertensive retinopathy, right eye: Secondary | ICD-10-CM | POA: Diagnosis not present

## 2015-09-01 ENCOUNTER — Ambulatory Visit (INDEPENDENT_AMBULATORY_CARE_PROVIDER_SITE_OTHER): Payer: Medicare Other | Admitting: Cardiovascular Disease

## 2015-09-01 ENCOUNTER — Encounter (INDEPENDENT_AMBULATORY_CARE_PROVIDER_SITE_OTHER): Payer: Self-pay

## 2015-09-01 ENCOUNTER — Encounter: Payer: Self-pay | Admitting: Cardiovascular Disease

## 2015-09-01 VITALS — BP 112/56 | HR 66 | Ht 65.0 in | Wt 157.6 lb

## 2015-09-01 DIAGNOSIS — I2510 Atherosclerotic heart disease of native coronary artery without angina pectoris: Secondary | ICD-10-CM

## 2015-09-01 DIAGNOSIS — I255 Ischemic cardiomyopathy: Secondary | ICD-10-CM | POA: Diagnosis not present

## 2015-09-01 DIAGNOSIS — I340 Nonrheumatic mitral (valve) insufficiency: Secondary | ICD-10-CM | POA: Diagnosis not present

## 2015-09-01 DIAGNOSIS — I5022 Chronic systolic (congestive) heart failure: Secondary | ICD-10-CM

## 2015-09-01 DIAGNOSIS — I447 Left bundle-branch block, unspecified: Secondary | ICD-10-CM

## 2015-09-01 DIAGNOSIS — I350 Nonrheumatic aortic (valve) stenosis: Secondary | ICD-10-CM

## 2015-09-01 MED ORDER — MELOXICAM 7.5 MG PO TABS: 7.5000 mg | ORAL_TABLET | Freq: Every day | ORAL | 3 refills | Status: DC | PRN

## 2015-09-01 NOTE — Patient Instructions (Signed)

## 2015-09-01 NOTE — Progress Notes (Signed)
Chief Complaint  Patient presents with  . Follow-up    6 month   History of Present Illness: 82 yo female with history of CAD, ischemic cardiomyopathy, LBBB, moderate mitral regurgitation here today for cardiac follow up. She was admitted to [facility] November 2015 with a NSTEMI. Cardiac cath 12/28/13 per Dr. Claiborne Billings with severe calcific disease in the proximal and mid LAD. LVEF noted to be 10%. She refused CABG. I performed a rotablator atherectomy of the LAD on 12/30/13 with Impella LV assist support. A 2.75 x 38 mm Promus Premier DES was deployed in the mid LAD. A 3.0 x 16 mm Promus Premier DES was deployed in the proximal LAD. The distal stented segment was post-dilated with a 2.75 x 20 mm  balloon x 2. She did develop a right groin pseudoaneurysm. She did well overall and was seen in f/u by Kerin Ransom, PA-C on 01/25/14. Of note, pt is a DNR and refused to wear a Lifevest following discharge. Echo 04/13/14 with LVEF=30-35% with mild MR, mild to moderate AS. Repeat echo 06/13/15 with LVEF=30-35%, mild to moderate AS.   She is here today for follow up. She is feeling well. No chest pain or SOB. No LE edema. She is very active, still working.   Primary care provider: No PCP Per Patient   Past Medical History:  Diagnosis Date  . Arthritis    "mostly my hands" (12/25/2013)  . Asthma   . Ischemic cardiomyopathy    a. EF 15%  . PONV (postoperative nausea and vomiting)   . Upper airway cough syndrome    Post-nasal drip    Past Surgical History:  Procedure Laterality Date  . APPENDECTOMY  1960's  . CARDIAC CATHETERIZATION  12/28/2013   Procedure: RIGHT/LEFT HEART CATH AND CORONARY ANGIOGRAPHY;  Surgeon: Troy Sine, MD;  Location: [facility] CATH LAB;  Service: Cardiovascular;;  . CYSTOCELE REPAIR  12/18/2011   Procedure: ANTERIOR REPAIR (CYSTOCELE);  Surgeon: Gus Height, MD;  Location: [facility];  Service: Gynecology;  Laterality: N/A;  . JOINT REPLACEMENT    . JOINT REPLACEMENT Right 1980's   "ring finger"  . PERCUTANEOUS CORONARY STENT INTERVENTION (PCI-S) N/A 12/30/2013   Procedure: PERCUTANEOUS CORONARY STENT INTERVENTION (PCI-S);  Surgeon: Burnell Blanks, MD;  Location: [facility] CATH LAB;  Service: Cardiovascular;  Laterality: N/A;  . REPLACEMENT TOTAL KNEE BILATERAL Bilateral 2009    Current Outpatient Prescriptions  Medication Sig Dispense Refill  . amoxicillin (AMOXIL) 500 MG capsule Take 4 capsules by mouth as needed. (Take 1 hour prior for dental procedures)  0  . aspirin EC 81 MG EC tablet Take 1 tablet (81 mg total) by mouth daily.    Marland Kitchen atorvastatin (LIPITOR) 40 MG tablet TAKE ONE TABLET BY MOUTH DAILY AT 6 PM 30 tablet 9  . carvedilol (COREG) 6.25 MG tablet TAKE ONE TABLET BY MOUTH TWICE DAILY WITH  A  MEAL 180 tablet 1  . clopidogrel (PLAVIX) 75 MG tablet TAKE ONE TABLET BY MOUTH ONCE DAILY WITH  BREAKFAST 30 tablet 6  . furosemide (LASIX) 40 MG tablet Take 1 tablet (40 mg total) by mouth daily. 90 tablet 1  . losartan (COZAAR) 25 MG tablet TAKE 1 TABLET BY MOUTH DAILY 90 tablet 0  . meloxicam (MOBIC) 7.5 MG tablet Take 7.5 mg by mouth 3 (three) times daily as needed (for pain).   1  . spironolactone (ALDACTONE) 25 MG tablet Take 0.5 tablets (12.5 mg total) by mouth 2 (two) times daily. 90 tablet 1  No current facility-administered medications for this visit.     Allergies  Allergen Reactions  . Lisinopril Cough    Social History   Social History  . Marital status: Widowed    Spouse name: N/A  . Number of children: N/A  . Years of education: N/A   Occupational History  . Not on file.   Social History Main Topics  . Smoking status: Never Smoker  . Smokeless tobacco: Never Used  . Alcohol use No  . Drug use: No  . Sexual activity: Not on file   Other Topics Concern  . Not on file   Social History Narrative  . No narrative on file    Family History  Problem Relation Age of Onset  . Cerebral aneurysm Mother     died at age 38  . Healthy  Father     died of old age    Review of Systems:  As stated in the HPI and otherwise negative.   BP (!) 112/56   Pulse 66   Ht 5\' 5"  (1.651 m)   Wt 157 lb 9.6 oz (71.5 kg)   BMI 26.23 kg/m   Physical Examination: General: Well developed, well nourished, NAD  HEENT: OP clear, mucus membranes moist  SKIN: warm, dry. No rashes. Neuro: No focal deficits  Musculoskeletal: Muscle strength 5/5 all ext  Psychiatric: Mood and affect normal  Neck: No JVD, no carotid bruits, no thyromegaly, no lymphadenopathy.  Lungs:Clear bilaterally, no wheezes, rhonci, crackles Cardiovascular: Regular rate and rhythm. Systolic murmur noted. No gallops or rubs. Abdomen:Soft. Bowel sounds present. Non-tender.  Extremities: No lower extremity edema. Pulses are 2 + in the bilateral DP/PT.  Echo 06/13/15: - Left ventricle: The cavity size was normal. Wall thickness was   increased in a pattern of moderate LVH. Systolic function was   moderately to severely reduced. The estimated ejection fraction   was in the range of 30% to 35%. Doppler parameters are consistent   with abnormal left ventricular relaxation (grade 1 diastolic   dysfunction). Doppler parameters are consistent with high   ventricular filling pressure. - Regional wall motion abnormality: Hypokinesis of the mid   anteroseptal, basal-mid inferoseptal, and apical septal   myocardium. - Ventricular septum: Septal motion showed abnormal function and   dyssynergy. - Aortic valve: Trileaflet; moderately thickened, moderately   calcified leaflets. Valve mobility was restricted. There was mild   stenosis. Stenosis severity may be underestimated due to reduced   systolic function. Peak velocity (S): 229 cm/s. Mean gradient   (S): 15 mm Hg. - Mitral valve: Calcified annulus. There was mild regurgitation. - Left atrium: The atrium was severely dilated. - Right ventricle: The cavity size was normal. Wall thickness was   normal. Systolic function was  normal. - Tricuspid valve: There was no regurgitation.   EKG:  EKG is not ordered today. The ekg ordered today demonstrates NSR,r ate 76 bpm. LBBB  Recent Labs: 01/04/2015: ALT 20; BUN 28; Creat 1.03; Hemoglobin 10.7; Platelets 206; Potassium 4.2; Sodium 139   Lipid Panel    Component Value Date/Time   CHOL 126 01/04/2015 0752   TRIG 60 01/04/2015 0752   HDL 61 01/04/2015 0752   CHOLHDL 2.1 01/04/2015 0752   VLDL 12 01/04/2015 0752   LDLCALC 53 01/04/2015 0752     Wt Readings from Last 3 Encounters:  09/01/15 157 lb 9.6 oz (71.5 kg)  12/29/14 160 lb (72.6 kg)  06/16/14 156 lb (70.8 kg)     Other  studies Reviewed: Additional studies/ records that were reviewed today include: . Review of the above records demonstrates:    Assessment and Plan:   1. CAD: s/p NSTEMI with complex PCI of the LAD with rotablator atherectomy December 2015. She is on ASA, Plavix, statin, beta blocker and Ace-inh.   2. Ischemic cardiomyopathy: LVEF=30-35% post PCI. She is on good medical therapy.   3. Chronic systolic CHF: Volume status is stable on Lasix.   4. LBBB: Chronic  5. Mitral regurgitation: Mild by echo May 2017.    6. Aortic stenosis: Mild to moderate by echo May 2017.    7. Arthritis: Will refill Mobic to use sparingly.   Current medicines are reviewed at length with the patient today.  The patient does not have concerns regarding medicines.  The following changes have been made:  no change  Labs/ tests ordered today include:   No orders of the defined types were placed in this encounter.   Disposition:   FU with me in 6 months  Signed, Lauree Chandler, MD 09/01/2015 3:24 PM    [facility] [place] [address], [place], [place]  [address] Phone: [phone]; Fax: [phone]

## 2015-09-06 DIAGNOSIS — J209 Acute bronchitis, unspecified: Secondary | ICD-10-CM | POA: Diagnosis not present

## 2015-09-06 DIAGNOSIS — J0100 Acute maxillary sinusitis, unspecified: Secondary | ICD-10-CM | POA: Diagnosis not present

## 2015-09-15 ENCOUNTER — Other Ambulatory Visit: Payer: Self-pay | Admitting: Cardiovascular Disease

## 2015-09-26 ENCOUNTER — Other Ambulatory Visit: Payer: Self-pay | Admitting: *Deleted

## 2015-09-26 MED ORDER — CLOPIDOGREL BISULFATE 75 MG PO TABS: ORAL_TABLET | ORAL | 1 refills | Status: DC

## 2015-09-29 ENCOUNTER — Other Ambulatory Visit: Payer: Self-pay | Admitting: Cardiovascular Disease

## 2015-11-09 ENCOUNTER — Other Ambulatory Visit: Payer: Self-pay | Admitting: *Deleted

## 2015-11-09 MED ORDER — ATORVASTATIN CALCIUM 40 MG PO TABS: ORAL_TABLET | ORAL | 9 refills | Status: DC

## 2015-11-28 ENCOUNTER — Ambulatory Visit (INDEPENDENT_AMBULATORY_CARE_PROVIDER_SITE_OTHER): Payer: Medicare Other | Admitting: Orthopaedic Surgery

## 2015-11-28 ENCOUNTER — Encounter (INDEPENDENT_AMBULATORY_CARE_PROVIDER_SITE_OTHER): Payer: Self-pay | Admitting: Orthopaedic Surgery

## 2015-11-28 VITALS — Ht 65.0 in | Wt 150.0 lb

## 2015-11-28 DIAGNOSIS — I255 Ischemic cardiomyopathy: Secondary | ICD-10-CM | POA: Diagnosis not present

## 2015-11-28 DIAGNOSIS — M25511 Pain in right shoulder: Secondary | ICD-10-CM | POA: Diagnosis not present

## 2015-11-28 DIAGNOSIS — G8929 Other chronic pain: Secondary | ICD-10-CM

## 2015-11-28 DIAGNOSIS — M25512 Pain in left shoulder: Secondary | ICD-10-CM

## 2015-11-28 MED ORDER — LIDOCAINE HCL 1 % IJ SOLN
1.0000 mL | INTRAMUSCULAR | Status: AC | PRN
  Administered 2015-11-28: 1 mL

## 2015-11-28 MED ORDER — METHYLPREDNISOLONE ACETATE 40 MG/ML IJ SUSP
40.0000 mg | INTRAMUSCULAR | Status: AC | PRN
  Administered 2015-11-28: 40 mg via INTRA_ARTICULAR

## 2015-11-28 NOTE — Progress Notes (Signed)
Office Visit Note   Patient: Beth Johnston           Date of Birth: 1925-08-12           MRN: [id number] Visit Date: 11/28/2015              Requested by: No referring provider defined for this encounter. PCP: No PCP Per Patient   Assessment & Plan: Visit Diagnoses:  1. Chronic pain of both shoulders     Plan: She needs injections in both shoulders today. We prepped both shoulders with Betadine and alcohol upon injection of 3 mL lidocaine and 1 mL Depo-Medrol which he tolerated well. As always she'll follow-up when necessary and needs to wait at least 3-4 months between injections.  Follow-Up Instructions: Return if symptoms worsen or fail to improve.   Orders:  No orders of the defined types were placed in this encounter.  No orders of the defined types were placed in this encounter.     Procedures: Large Joint Inj Date/Time: 11/28/2015 1:53 PM Performed by: Anise Salvo Authorized by: Mcarthur Rossetti   Consent Given by:  Patient Indications:  Pain Location:  Shoulder Site:  R subacromial bursa Needle Size:  22 G Needle Length:  1.5 inches Ultrasound Guidance: No   Fluoroscopic Guidance: No   Arthrogram: No Medications:  40 mg methylPREDNISolone acetate 40 MG/ML; 1 mL lidocaine 1 % Large Joint Inj Date/Time: 11/28/2015 1:54 PM Performed by: Anise Salvo Authorized by: Mcarthur Rossetti   Consent Given by:  Patient Indications:  Pain Location:  Shoulder Site:  L subacromial bursa Needle Size:  22 G Needle Length:  1.5 inches Ultrasound Guidance: No   Fluoroscopic Guidance: No   Arthrogram: No Medications:  1 mL lidocaine 1 %; 40 mg methylPREDNISolone acetate 40 MG/ML     Clinical Data: No additional findings.   Subjective: Chief Complaint  Patient presents with  . Left Shoulder - Pain    Bilateral shoulder pain. Requests injection in bilateral shoulders. Last injections 05/11/2015 with good relief. Patient does take  Meloxicam every once in a while.  . Right Shoulder - Pain    Bilateral shoulder pain. Requests bilateral shoulder injections. Last injections 05/11/2015 with good relief. Patient does take Meloxicam every once in a while.    HPI  Review of Systems   Objective: Vital Signs: Ht 5\' 5"  (1.651 m)   Wt 150 lb (68 kg)   BMI 24.96 kg/m   Physical Exam  Ortho Exam Both shoulders continue to show pain on exam with limited mobility and obvious signs of impingement and rotator cuff arthropathy. There is been no acute changes. Specialty Comments:  No specialty comments available.  Imaging: No results found.   PMFS History: Patient Active Problem List   Diagnosis Date Noted  . Acute heart failure ([place])   . Elevated troponin   . Stented coronary artery   . Acute respiratory failure ([place]) 01/02/2014  . CAD S/P high risk LAD PCI/DES 12/30/13 01/02/2014  . Pseudoaneurysm following PCI 01/02/2014  . Blood loss anemia 01/02/2014  . Acute systolic CHF (congestive heart failure) ([place])   . NSTEMI (non-ST elevated myocardial infarction) ([place]) 12/26/2013  . Mitral regurgitation 12/26/2013  . Cardiomyopathy, ischemic-EF 15%-pre PCI 12/26/2013  . LBBB (left bundle branch block) 12/25/2013  . Asthma, persistent controlled 05/28/2013  . Upper airway cough syndrome 05/28/2013  . Cough 05/11/2013   Past Medical History:  Diagnosis Date  . Arthritis    "mostly my  hands" (12/25/2013)  . Asthma   . Ischemic cardiomyopathy    a. EF 15%  . PONV (postoperative nausea and vomiting)   . Upper airway cough syndrome    Post-nasal drip    Family History  Problem Relation Age of Onset  . Cerebral aneurysm Mother     died at age 79  . Healthy Father     died of old age    Past Surgical History:  Procedure Laterality Date  . APPENDECTOMY  1960's  . CARDIAC CATHETERIZATION  12/28/2013   Procedure: RIGHT/LEFT HEART CATH AND CORONARY ANGIOGRAPHY;  Surgeon: Troy Sine, MD;  Location: [facility] CATH  LAB;  Service: Cardiovascular;;  . CYSTOCELE REPAIR  12/18/2011   Procedure: ANTERIOR REPAIR (CYSTOCELE);  Surgeon: Gus Height, MD;  Location: [facility];  Service: Gynecology;  Laterality: N/A;  . JOINT REPLACEMENT    . JOINT REPLACEMENT Right 1980's   "ring finger"  . PERCUTANEOUS CORONARY STENT INTERVENTION (PCI-S) N/A 12/30/2013   Procedure: PERCUTANEOUS CORONARY STENT INTERVENTION (PCI-S);  Surgeon: Burnell Blanks, MD;  Location: [facility] CATH LAB;  Service: Cardiovascular;  Laterality: N/A;  . REPLACEMENT TOTAL KNEE BILATERAL Bilateral 2009   Social History   Occupational History  . Not on file.   Social History Main Topics  . Smoking status: Never Smoker  . Smokeless tobacco: Never Used  . Alcohol use No  . Drug use: No  . Sexual activity: Not on file

## 2015-12-13 ENCOUNTER — Other Ambulatory Visit: Payer: Self-pay | Admitting: Cardiovascular Disease

## 2016-01-04 ENCOUNTER — Ambulatory Visit (INDEPENDENT_AMBULATORY_CARE_PROVIDER_SITE_OTHER): Payer: Medicare Other | Admitting: Cardiovascular Disease

## 2016-01-04 VITALS — BP 122/58 | HR 75 | Ht 65.0 in | Wt 156.0 lb

## 2016-01-04 DIAGNOSIS — I2510 Atherosclerotic heart disease of native coronary artery without angina pectoris: Secondary | ICD-10-CM

## 2016-01-04 DIAGNOSIS — I255 Ischemic cardiomyopathy: Secondary | ICD-10-CM

## 2016-01-04 DIAGNOSIS — I340 Nonrheumatic mitral (valve) insufficiency: Secondary | ICD-10-CM

## 2016-01-04 DIAGNOSIS — I5022 Chronic systolic (congestive) heart failure: Secondary | ICD-10-CM

## 2016-01-04 DIAGNOSIS — I447 Left bundle-branch block, unspecified: Secondary | ICD-10-CM

## 2016-01-04 LAB — LIPID PANEL
CHOL/HDL RATIO: 2.2 ratio (ref <5.0)
Cholesterol: 135 mg/dL (ref <200)
HDL: 61 mg/dL (ref >50)
LDL CALC: 50 mg/dL (ref <100)
Triglycerides: 122 mg/dL (ref <150)
VLDL: 24 mg/dL (ref <30)

## 2016-01-04 LAB — COMPREHENSIVE METABOLIC PANEL
ALT: 20 U/L (ref 6–29)
AST: 22 U/L (ref 10–35)
Albumin: 3.9 g/dL (ref 3.6–5.1)
Alkaline Phosphatase: 60 U/L (ref 33–130)
BUN: 37 mg/dL — AB (ref 7–25)
CHLORIDE: 106 mmol/L (ref 98–110)
CO2: 21 mmol/L (ref 20–31)
CREATININE: 1.1 mg/dL — AB (ref 0.60–0.88)
Calcium: 8.9 mg/dL (ref 8.6–10.4)
GLUCOSE: 104 mg/dL — AB (ref 65–99)
POTASSIUM: 4.5 mmol/L (ref 3.5–5.3)
SODIUM: 137 mmol/L (ref 135–146)
TOTAL PROTEIN: 5.9 g/dL — AB (ref 6.1–8.1)
Total Bilirubin: 0.7 mg/dL (ref 0.2–1.2)

## 2016-01-04 NOTE — Patient Instructions (Signed)
Medication Instructions:  Your physician recommends that you continue on your current medications as directed. Please refer to the Current Medication list given to you today.   Labwork: Lab work to be done today--CMET, lipid profile  Testing/Procedures: none  Follow-Up: Your physician recommends that you schedule a follow-up appointment in: 6 months.  Please call our office in about 4 months to schedule this appointment.     Any Other Special Instructions Will Be Listed Below (If Applicable).     If you need a refill on your cardiac medications before your next appointment, please call your pharmacy.

## 2016-01-04 NOTE — Progress Notes (Signed)
Chief Complaint  Patient presents with  . Cough   History of Present Illness: 82 yo female with history of CAD, ischemic cardiomyopathy, LBBB, moderate mitral regurgitation here today for cardiac follow up. She was admitted to [facility] November 2015 with a NSTEMI. Cardiac cath 12/28/13 per Dr. Claiborne Billings with severe calcific disease in the proximal and mid LAD. LVEF noted to be 10%. She refused CABG. I performed a rotablator atherectomy of the LAD on 12/30/13 with Impella LV assist support. A 2.75 x 38 mm Promus Premier DES was deployed in the mid LAD. A 3.0 x 16 mm Promus Premier DES was deployed in the proximal LAD. The distal stented segment was post-dilated with a 2.75 x 20 mm [place] balloon x 2. She did develop a right groin pseudoaneurysm. Of note, pt is a DNR and refused to wear a Lifevest following discharge. Echo 04/13/14 with LVEF=30-35% with mild MR, mild to moderate AS. Repeat echo 06/13/15 with LVEF=30-35%, mild to moderate AS.   She is here today for follow up. She is feeling well. No chest pain or SOB. No LE edema. She is very active, still working washing clothes. She is going [place] a [place] in [place].   Primary care provider: No PCP Per Patient   Past Medical History:  Diagnosis Date  . Arthritis    "mostly my hands" (12/25/2013)  . Asthma   . Ischemic cardiomyopathy    a. EF 15%  . PONV (postoperative nausea and vomiting)   . Upper airway cough syndrome    Post-nasal drip    Past Surgical History:  Procedure Laterality Date  . APPENDECTOMY  1960's  . CARDIAC CATHETERIZATION  12/28/2013   Procedure: RIGHT/LEFT HEART CATH AND CORONARY ANGIOGRAPHY;  Surgeon: Troy Sine, MD;  Location: [facility] CATH LAB;  Service: Cardiovascular;;  . CYSTOCELE REPAIR  12/18/2011   Procedure: ANTERIOR REPAIR (CYSTOCELE);  Surgeon: Gus Height, MD;  Location: [facility];  Service: Gynecology;  Laterality: N/A;  . JOINT REPLACEMENT    . JOINT REPLACEMENT Right 1980's   "ring finger"  .  PERCUTANEOUS CORONARY STENT INTERVENTION (PCI-S) N/A 12/30/2013   Procedure: PERCUTANEOUS CORONARY STENT INTERVENTION (PCI-S);  Surgeon: Burnell Blanks, MD;  Location: [facility] CATH LAB;  Service: Cardiovascular;  Laterality: N/A;  . REPLACEMENT TOTAL KNEE BILATERAL Bilateral 2009    Current Outpatient Prescriptions  Medication Sig Dispense Refill  . aspirin EC 81 MG EC tablet Take 1 tablet (81 mg total) by mouth daily.    Marland Kitchen atorvastatin (LIPITOR) 40 MG tablet TAKE ONE TABLET BY MOUTH DAILY AT 6 PM 30 tablet 9  . carvedilol (COREG) 6.25 MG tablet TAKE ONE TABLET BY MOUTH TWICE DAILY WITH  A  MEAL 180 tablet 1  . clopidogrel (PLAVIX) 75 MG tablet TAKE ONE TABLET BY MOUTH ONCE DAILY WITH  BREAKFAST 90 tablet 1  . furosemide (LASIX) 40 MG tablet TAKE 1 TABLET (40 MG TOTAL) BY MOUTH DAILY. 90 tablet 2  . losartan (COZAAR) 25 MG tablet Take 1 tablet (25 mg total) by mouth daily. 90 tablet 3  . spironolactone (ALDACTONE) 25 MG tablet Take 0.5 tablets (12.5 mg total) by mouth 2 (two) times daily. 90 tablet 1  . amoxicillin (AMOXIL) 500 MG capsule Take 4 capsules by mouth as needed. (Take 1 hour prior for dental procedures)  0  . meloxicam (MOBIC) 7.5 MG tablet Take 1 tablet (7.5 mg total) by mouth daily as needed (for pain). (Patient not taking: Reported on  01/04/2016) 30 tablet 3   No current facility-administered medications for this visit.     Allergies  Allergen Reactions  . Lisinopril Cough    Social History   Social History  . Marital status: Widowed    Spouse name: N/A  . Number of children: N/A  . Years of education: N/A   Occupational History  . Not on file.   Social History Main Topics  . Smoking status: Never Smoker  . Smokeless tobacco: Never Used  . Alcohol use No  . Drug use: No  . Sexual activity: Not on file   Other Topics Concern  . Not on file   Social History Narrative  . No narrative on file    Family History  Problem Relation Age of Onset  . Cerebral  aneurysm Mother     died at age 38  . Healthy Father     died of old age    Review of Systems:  As stated in the HPI and otherwise negative.   BP (!) 122/58   Pulse 75   Ht 5' 5"  (1.651 m)   Wt 156 lb (70.8 kg)   BMI 25.96 kg/m   Physical Examination: General: Well developed, well nourished, NAD  HEENT: OP clear, mucus membranes moist  SKIN: warm, dry. No rashes. Neuro: No focal deficits  Musculoskeletal: Muscle strength 5/5 all ext  Psychiatric: Mood and affect normal  Neck: No JVD, no carotid bruits, no thyromegaly, no lymphadenopathy.  Lungs:Clear bilaterally, no wheezes, rhonci, crackles Cardiovascular: Regular rate and rhythm. Systolic murmur noted. No gallops or rubs. Abdomen:Soft. Bowel sounds present. Non-tender.  Extremities: No lower extremity edema. Pulses are 2 + in the bilateral DP/PT.  Echo 06/13/15: - Left ventricle: The cavity size was normal. Wall thickness was   increased in a pattern of moderate LVH. Systolic function was   moderately to severely reduced. The estimated ejection fraction   was in the range of 30% to 35%. Doppler parameters are consistent   with abnormal left ventricular relaxation (grade 1 diastolic   dysfunction). Doppler parameters are consistent with high   ventricular filling pressure. - Regional wall motion abnormality: Hypokinesis of the mid   anteroseptal, basal-mid inferoseptal, and apical septal   myocardium. - Ventricular septum: Septal motion showed abnormal function and   dyssynergy. - Aortic valve: Trileaflet; moderately thickened, moderately   calcified leaflets. Valve mobility was restricted. There was mild   stenosis. Stenosis severity may be underestimated due to reduced   systolic function. Peak velocity (S): 229 cm/s. Mean gradient   (S): 15 mm Hg. - Mitral valve: Calcified annulus. There was mild regurgitation. - Left atrium: The atrium was severely dilated. - Right ventricle: The cavity size was normal. Wall  thickness was   normal. Systolic function was normal. - Tricuspid valve: There was no regurgitation.   EKG:  EKG is not ordered today. The ekg ordered today demonstrates NSR,r ate 75 bpm. LBBB  Recent Labs: No results found for requested labs within last 8760 hours.   Lipid Panel    Component Value Date/Time   CHOL 126 01/04/2015 0752   TRIG 60 01/04/2015 0752   HDL 61 01/04/2015 0752   CHOLHDL 2.1 01/04/2015 0752   VLDL 12 01/04/2015 0752   LDLCALC 53 01/04/2015 0752     Wt Readings from Last 3 Encounters:  01/04/16 156 lb (70.8 kg)  11/28/15 150 lb (68 kg)  09/01/15 157 lb 9.6 oz (71.5 kg)     Other studies  Reviewed: Additional studies/ records that were reviewed today include: . Review of the above records demonstrates:    Assessment and Plan:   1. CAD: s/p NSTEMI with complex PCI of the LAD with rotablator atherectomy December 2015. She is on ASA, Plavix, statin, beta blocker and Ace-inh.   2. Ischemic cardiomyopathy: LVEF=30-35% post PCI. She is on good medical therapy.   3. Chronic systolic CHF: Volume status is stable on Lasix.   4. LBBB: Chronic  5. Mitral regurgitation: Mild by echo May 2017.    6. Aortic stenosis: Mild to moderate by echo May 2017.    Current medicines are reviewed at length with the patient today.  The patient does not have concerns regarding medicines.  The following changes have been made:  no change  Labs/ tests ordered today include:   Orders Placed This Encounter  Procedures  . Comp Met (CMET)  . Lipid Profile    Disposition:   FU with me in 6 months  Signed, Lauree Chandler, MD 01/04/2016 11:33 AM    [facility] [address], [place], [place]  [address] Phone: [phone]; Fax: [phone]

## 2016-01-06 ENCOUNTER — Other Ambulatory Visit: Payer: Self-pay | Admitting: *Deleted

## 2016-01-06 MED ORDER — ATORVASTATIN CALCIUM 40 MG PO TABS: ORAL_TABLET | ORAL | 3 refills | Status: DC

## 2016-01-11 DIAGNOSIS — L570 Actinic keratosis: Secondary | ICD-10-CM | POA: Diagnosis not present

## 2016-01-11 DIAGNOSIS — L821 Other seborrheic keratosis: Secondary | ICD-10-CM | POA: Diagnosis not present

## 2016-01-11 NOTE — Addendum Note (Signed)
Addended by: Leodis Liverpool L on: 01/11/2016 10:20 AM   Modules accepted: Orders

## 2016-01-13 DIAGNOSIS — J209 Acute bronchitis, unspecified: Secondary | ICD-10-CM | POA: Diagnosis not present

## 2016-02-06 DIAGNOSIS — R05 Cough: Secondary | ICD-10-CM | POA: Diagnosis not present

## 2016-02-06 DIAGNOSIS — R0600 Dyspnea, unspecified: Secondary | ICD-10-CM | POA: Diagnosis not present

## 2016-02-27 ENCOUNTER — Ambulatory Visit (INDEPENDENT_AMBULATORY_CARE_PROVIDER_SITE_OTHER): Payer: Medicare Other | Admitting: Orthopaedic Surgery

## 2016-02-27 DIAGNOSIS — G8929 Other chronic pain: Secondary | ICD-10-CM

## 2016-02-27 DIAGNOSIS — M25511 Pain in right shoulder: Secondary | ICD-10-CM

## 2016-02-27 DIAGNOSIS — M25512 Pain in left shoulder: Secondary | ICD-10-CM

## 2016-02-27 MED ORDER — METHYLPREDNISOLONE ACETATE 40 MG/ML IJ SUSP
40.0000 mg | INTRAMUSCULAR | Status: AC | PRN
  Administered 2016-02-27: 40 mg via INTRA_ARTICULAR

## 2016-02-27 MED ORDER — LIDOCAINE HCL 1 % IJ SOLN
3.0000 mL | INTRAMUSCULAR | Status: AC | PRN
  Administered 2016-02-27: 3 mL

## 2016-02-27 NOTE — Progress Notes (Signed)
Office Visit Note   Patient: Beth Johnston           Date of Birth: 25-Oct-1925           MRN: [id number] Visit Date: 02/27/2016              Requested by: No referring provider defined for this encounter. PCP: No PCP Per Patient   Assessment & Plan: Visit Diagnoses:  1. Chronic pain of both shoulders     Plan: she tolerated the shoulder injections well she always does. She knows to wait at least 3 or 4 months between injections. She'll follow-up as needed.  Follow-Up Instructions: Return if symptoms worsen or fail to improve.   Orders:  Orders Placed This Encounter  Procedures  . Large Joint Injection/Arthrocentesis  . Large Joint Injection/Arthrocentesis   No orders of the defined types were placed in this encounter.     Procedures: Large Joint Inj Date/Time: 02/27/2016 4:16 PM Performed by: Mcarthur Rossetti Authorized by: Jean Rosenthal Y   Location:  Shoulder Site:  R subacromial bursa Ultrasound Guidance: No   Fluoroscopic Guidance: No   Arthrogram: No   Medications:  3 mL lidocaine 1 %; 40 mg methylPREDNISolone acetate 40 MG/ML Large Joint Inj Date/Time: 02/27/2016 4:16 PM Performed by: Mcarthur Rossetti Authorized by: Mcarthur Rossetti   Location:  Shoulder Site:  L subacromial bursa Ultrasound Guidance: No   Fluoroscopic Guidance: No   Arthrogram: No   Medications:  3 mL lidocaine 1 %; 40 mg methylPREDNISolone acetate 40 MG/ML     Clinical Data: No additional findings.   Subjective: No chief complaint on file. The patient is well-known to me. She has chronic bilateral shoulder pain comes in from time to time for steroid injections in her shoulders. Is been 3 months since her last injections. She is actually heading on a cruise this Thursday and will be gone for 2 weeks. She like to have steroid injections in both her shoulders today.HPI  Review of Systems She denies any recent illnesses or change in her medical  systems her health. Everything is stable she states.  Objective: Vital Signs: There were no vitals taken for this visit.  Physical Exam She is alert and oriented 3. Ortho Exam Examination of other shoulder shows pain with abduction and external rotation as well as internal rotation with adduction. Both shoulder show weak rotator Specialty Comments:  No specialty comments available.  Imaging: No results found.   PMFS History: Patient Active Problem List   Diagnosis Date Noted  . Chronic pain of both shoulders 02/27/2016  . Acute heart failure ([place])   . Elevated troponin   . Stented coronary artery   . Acute respiratory failure ([place]) 01/02/2014  . CAD S/P high risk LAD PCI/DES 12/30/13 01/02/2014  . Pseudoaneurysm following PCI 01/02/2014  . Blood loss anemia 01/02/2014  . Acute systolic CHF (congestive heart failure) ([place])   . NSTEMI (non-ST elevated myocardial infarction) ([place]) 12/26/2013  . Mitral regurgitation 12/26/2013  . Cardiomyopathy, ischemic-EF 15%-pre PCI 12/26/2013  . LBBB (left bundle branch block) 12/25/2013  . Asthma, persistent controlled 05/28/2013  . Upper airway cough syndrome 05/28/2013  . Cough 05/11/2013   Past Medical History:  Diagnosis Date  . Arthritis    "mostly my hands" (12/25/2013)  . Asthma   . Ischemic cardiomyopathy    a. EF 15%  . PONV (postoperative nausea and vomiting)   . Upper airway cough syndrome    Post-nasal drip  Family History  Problem Relation Age of Onset  . Cerebral aneurysm Mother     died at age 44  . Healthy Father     died of old age    Past Surgical History:  Procedure Laterality Date  . APPENDECTOMY  1960's  . CARDIAC CATHETERIZATION  12/28/2013   Procedure: RIGHT/LEFT HEART CATH AND CORONARY ANGIOGRAPHY;  Surgeon: Troy Sine, MD;  Location: [facility] CATH LAB;  Service: Cardiovascular;;  . CYSTOCELE REPAIR  12/18/2011   Procedure: ANTERIOR REPAIR (CYSTOCELE);  Surgeon: Gus Height, MD;  Location: [facility];   Service: Gynecology;  Laterality: N/A;  . JOINT REPLACEMENT    . JOINT REPLACEMENT Right 1980's   "ring finger"  . PERCUTANEOUS CORONARY STENT INTERVENTION (PCI-S) N/A 12/30/2013   Procedure: PERCUTANEOUS CORONARY STENT INTERVENTION (PCI-S);  Surgeon: Burnell Blanks, MD;  Location: [facility] CATH LAB;  Service: Cardiovascular;  Laterality: N/A;  . REPLACEMENT TOTAL KNEE BILATERAL Bilateral 2009   Social History   Occupational History  . Not on file.   Social History Main Topics  . Smoking status: Never Smoker  . Smokeless tobacco: Never Used  . Alcohol use No  . Drug use: No  . Sexual activity: Not on file

## 2016-03-18 ENCOUNTER — Other Ambulatory Visit: Payer: Self-pay | Admitting: Cardiovascular Disease

## 2016-03-19 ENCOUNTER — Telehealth: Payer: Self-pay | Admitting: Cardiovascular Disease

## 2016-03-19 NOTE — Telephone Encounter (Signed)
Patient's daughter, Delsa Grana [facility]) called to enquire about patient taking Lipitor 40 mg daily. Joni stated that patient is afraid to take medication since she heard it can cause memory loss. Patient has been taking Lipitor for 4 years, and she has not had any memory problems, according to her daughter. Patient's daughter wants to know if patient can cut back on her dosage or stop taking until her appointment in June. Will forward to Dr. Angelena Form for advisement.

## 2016-03-19 NOTE — Telephone Encounter (Signed)
New Message ° °Pt voiced wanting to speak with nurse. ° °Please f/u °

## 2016-03-20 NOTE — Telephone Encounter (Signed)
She can reduce her dose of Lipitor to 20 mg daily. She will need to have fasting lipids and LFTs in 12 weeks unless this has been done in primary care recently. Thanks, chris

## 2016-03-21 MED ORDER — ATORVASTATIN CALCIUM 40 MG PO TABS: 20.0000 mg | ORAL_TABLET | Freq: Every day | ORAL | 3 refills | Status: DC

## 2016-03-21 NOTE — Telephone Encounter (Signed)
I spoke with Beth Johnston's daughter and gave her information from Dr. Angelena Form.  Beth Johnston will break the 40 mg tablets in half.  She will have fasting lab work done when here to see Dr. Angelena Form on 06/14/16

## 2016-03-27 DIAGNOSIS — L570 Actinic keratosis: Secondary | ICD-10-CM | POA: Diagnosis not present

## 2016-03-27 DIAGNOSIS — L821 Other seborrheic keratosis: Secondary | ICD-10-CM | POA: Diagnosis not present

## 2016-03-30 ENCOUNTER — Ambulatory Visit: Payer: Medicare Other | Admitting: Cardiovascular Disease

## 2016-05-11 DIAGNOSIS — R0600 Dyspnea, unspecified: Secondary | ICD-10-CM | POA: Diagnosis not present

## 2016-05-11 DIAGNOSIS — R0602 Shortness of breath: Secondary | ICD-10-CM | POA: Diagnosis not present

## 2016-05-11 DIAGNOSIS — R05 Cough: Secondary | ICD-10-CM | POA: Diagnosis not present

## 2016-05-11 DIAGNOSIS — R062 Wheezing: Secondary | ICD-10-CM | POA: Diagnosis not present

## 2016-05-11 DIAGNOSIS — J069 Acute upper respiratory infection, unspecified: Secondary | ICD-10-CM | POA: Diagnosis not present

## 2016-05-27 ENCOUNTER — Other Ambulatory Visit: Payer: Self-pay | Admitting: Cardiovascular Disease

## 2016-06-07 ENCOUNTER — Telehealth: Payer: Self-pay | Admitting: Cardiovascular Disease

## 2016-06-07 NOTE — Telephone Encounter (Signed)
I spoke with pt and clarified appt date and time.  She is aware appt is May 17,2018 at 8:00 and she should be fasting for this appt.

## 2016-06-07 NOTE — Telephone Encounter (Signed)
Patient came in today under the impression that she had an appointment to have an EKG or Echo.  The only appointment found in her record was a May 17th with Dr. Angelena Form.  Please call patient back regarding this misunderstanding.

## 2016-06-07 NOTE — Telephone Encounter (Signed)
I placed call to pt but received message stating voicemail not set up.  Will continue to try to reach pt.

## 2016-06-12 DIAGNOSIS — H26492 Other secondary cataract, left eye: Secondary | ICD-10-CM | POA: Diagnosis not present

## 2016-06-12 DIAGNOSIS — H35031 Hypertensive retinopathy, right eye: Secondary | ICD-10-CM | POA: Diagnosis not present

## 2016-06-14 ENCOUNTER — Ambulatory Visit (INDEPENDENT_AMBULATORY_CARE_PROVIDER_SITE_OTHER): Payer: Medicare Other | Admitting: Cardiovascular Disease

## 2016-06-14 ENCOUNTER — Encounter: Payer: Self-pay | Admitting: Cardiovascular Disease

## 2016-06-14 VITALS — BP 122/54 | HR 68 | Ht 65.0 in | Wt 154.0 lb

## 2016-06-14 DIAGNOSIS — I255 Ischemic cardiomyopathy: Secondary | ICD-10-CM

## 2016-06-14 DIAGNOSIS — I2510 Atherosclerotic heart disease of native coronary artery without angina pectoris: Secondary | ICD-10-CM | POA: Diagnosis not present

## 2016-06-14 DIAGNOSIS — I350 Nonrheumatic aortic (valve) stenosis: Secondary | ICD-10-CM

## 2016-06-14 DIAGNOSIS — I447 Left bundle-branch block, unspecified: Secondary | ICD-10-CM | POA: Diagnosis not present

## 2016-06-14 DIAGNOSIS — I340 Nonrheumatic mitral (valve) insufficiency: Secondary | ICD-10-CM | POA: Diagnosis not present

## 2016-06-14 DIAGNOSIS — I5022 Chronic systolic (congestive) heart failure: Secondary | ICD-10-CM | POA: Diagnosis not present

## 2016-06-14 LAB — COMPREHENSIVE METABOLIC PANEL
A/G RATIO: 2.1 (ref 1.2–2.2)
ALT: 16 IU/L (ref 0–32)
AST: 24 IU/L (ref 0–40)
Albumin: 4.1 g/dL (ref 3.5–4.7)
Alkaline Phosphatase: 76 IU/L (ref 39–117)
BUN/Creatinine Ratio: 27 (ref 12–28)
BUN: 29 mg/dL — AB (ref 8–27)
Bilirubin Total: 0.8 mg/dL (ref 0.0–1.2)
CALCIUM: 9.4 mg/dL (ref 8.7–10.3)
CHLORIDE: 101 mmol/L (ref 96–106)
CO2: 22 mmol/L (ref 18–29)
Creatinine, Ser: 1.08 mg/dL — ABNORMAL HIGH (ref 0.57–1.00)
GFR calc Af Amer: 53 mL/min/{1.73_m2} — ABNORMAL LOW (ref >59)
GFR calc non Af Amer: 46 mL/min/{1.73_m2} — ABNORMAL LOW (ref >59)
GLUCOSE: 96 mg/dL (ref 65–99)
Globulin, Total: 2 g/dL (ref 1.5–4.5)
POTASSIUM: 4.8 mmol/L (ref 3.5–5.2)
Sodium: 141 mmol/L (ref 134–144)
Total Protein: 6.1 g/dL (ref 6.0–8.5)

## 2016-06-14 LAB — LIPID PANEL
CHOL/HDL RATIO: 2.4 ratio (ref 0.0–4.4)
Cholesterol, Total: 145 mg/dL (ref 100–199)
HDL: 60 mg/dL (ref >39)
LDL Calculated: 70 mg/dL (ref 0–99)
TRIGLYCERIDES: 77 mg/dL (ref 0–149)
VLDL Cholesterol Cal: 15 mg/dL (ref 5–40)

## 2016-06-14 NOTE — Patient Instructions (Signed)
Medication Instructions:  Your physician has recommended you make the following change in your medication:  Stop aspirin   Labwork: Lab work to be done today--CMET and lipid profile  Testing/Procedures: none  Follow-Up: Your physician recommends that you schedule a follow-up appointment in: 6 months.  Please call our office in about 3 months to schedule this appointment.     Any Other Special Instructions Will Be Listed Below (If Applicable).     If you need a refill on your cardiac medications before your next appointment, please call your pharmacy.

## 2016-06-14 NOTE — Progress Notes (Signed)
Chief Complaint  Patient presents with  . Follow-up    CAD   History of Present Illness: 83 yo female with history of CAD, ischemic cardiomyopathy, LBBB, mitral regurgitation and aortic stenosis who is here today for cardiac follow up. She was admitted to [facility] in November of 2015 with a NSTEMI and was found to have severe calcific disease in the LAD by cath 12/28/13. LVEF was 10%. She refused CABG and underwent high risk PCI/rotablator atherectomy of the LAD with Impella LV support. 2 drug eluting stents were deployed in the LAD. Echo May 2017 with LVEF=30-35%, mild to moderate AS.   She is here today for follow up. The patient denies any chest pain, dyspnea, palpitations, lower extremity edema, orthopnea, PND, dizziness, near syncope or syncope. She started her garden this year.   Primary care provider: Patient, No Pcp Per  Past Medical History:  Diagnosis Date  . Arthritis    "mostly my hands" (12/25/2013)  . Asthma   . Ischemic cardiomyopathy    a. EF 15%  . PONV (postoperative nausea and vomiting)   . Upper airway cough syndrome    Post-nasal drip    Past Surgical History:  Procedure Laterality Date  . APPENDECTOMY  1960's  . CARDIAC CATHETERIZATION  12/28/2013   Procedure: RIGHT/LEFT HEART CATH AND CORONARY ANGIOGRAPHY;  Surgeon: Troy Sine, MD;  Location: [facility] CATH LAB;  Service: Cardiovascular;;  . CYSTOCELE REPAIR  12/18/2011   Procedure: ANTERIOR REPAIR (CYSTOCELE);  Surgeon: Gus Height, MD;  Location: [facility];  Service: Gynecology;  Laterality: N/A;  . JOINT REPLACEMENT    . JOINT REPLACEMENT Right 1980's   "ring finger"  . PERCUTANEOUS CORONARY STENT INTERVENTION (PCI-S) N/A 12/30/2013   Procedure: PERCUTANEOUS CORONARY STENT INTERVENTION (PCI-S);  Surgeon: Burnell Blanks, MD;  Location: [facility] CATH LAB;  Service: Cardiovascular;  Laterality: N/A;  . REPLACEMENT TOTAL KNEE BILATERAL Bilateral 2009    Current Outpatient Prescriptions  Medication Sig Dispense  Refill  . amoxicillin (AMOXIL) 500 MG capsule Take 4 capsules by mouth as needed. (Take 1 hour prior for dental procedures)  0  . atorvastatin (LIPITOR) 40 MG tablet Take 0.5 tablets (20 mg total) by mouth daily. 90 tablet 3  . carvedilol (COREG) 6.25 MG tablet TAKE ONE TABLET BY MOUTH TWICE DAILY WITH A MEAL 60 tablet 7  . clopidogrel (PLAVIX) 75 MG tablet TAKE ONE TABLET BY MOUTH ONCE DAILY WITH BREAKFAST 90 tablet 2  . furosemide (LASIX) 40 MG tablet TAKE 1 TABLET (40 MG TOTAL) BY MOUTH DAILY. 90 tablet 2  . losartan (COZAAR) 25 MG tablet Take 1 tablet (25 mg total) by mouth daily. 90 tablet 3  . meloxicam (MOBIC) 7.5 MG tablet Take 1 tablet (7.5 mg total) by mouth daily as needed (for pain). 30 tablet 3  . spironolactone (ALDACTONE) 25 MG tablet Take 0.5 tablets (12.5 mg total) by mouth 2 (two) times daily. 90 tablet 1   No current facility-administered medications for this visit.     Allergies  Allergen Reactions  . Lisinopril Cough    Social History   Social History  . Marital status: Widowed    Spouse name: N/A  . Number of children: N/A  . Years of education: N/A   Occupational History  . Not on file.   Social History Main Topics  . Smoking status: Never Smoker  . Smokeless tobacco: Never Used  . Alcohol use No  . Drug use: No  . Sexual activity: Not on  file   Other Topics Concern  . Not on file   Social History Narrative  . No narrative on file    Family History  Problem Relation Age of Onset  . Cerebral aneurysm Mother        died at age 92  . Healthy Father        died of old age    Review of Systems:  As stated in the HPI and otherwise negative.   BP (!) 122/54   Pulse 68   Ht '5\' 5"'  (1.651 m)   Wt 154 lb (69.9 kg)   SpO2 96%   BMI 25.63 kg/m   Physical Examination: General: Well developed, well nourished, NAD  HEENT: OP clear, mucus membranes moist  SKIN: warm, dry. No rashes. Neuro: No focal deficits  Musculoskeletal: Muscle strength 5/5  all ext  Psychiatric: Mood and affect normal  Neck: No JVD, no carotid bruits, no thyromegaly, no lymphadenopathy.  Lungs:Clear bilaterally, no wheezes, rhonci, crackles Cardiovascular: Regular rate and rhythm. Systolic murmur.  Abdomen:Soft. Bowel sounds present. Non-tender.  Extremities: No lower extremity edema. Pulses are 2 + in the bilateral DP/PT.   Echo 06/13/15: - Left ventricle: The cavity size was normal. Wall thickness was   increased in a pattern of moderate LVH. Systolic function was   moderately to severely reduced. The estimated ejection fraction   was in the range of 30% to 35%. Doppler parameters are consistent   with abnormal left ventricular relaxation (grade 1 diastolic   dysfunction). Doppler parameters are consistent with high   ventricular filling pressure. - Regional wall motion abnormality: Hypokinesis of the mid   anteroseptal, basal-mid inferoseptal, and apical septal   myocardium. - Ventricular septum: Septal motion showed abnormal function and   dyssynergy. - Aortic valve: Trileaflet; moderately thickened, moderately   calcified leaflets. Valve mobility was restricted. There was mild   stenosis. Stenosis severity may be underestimated due to reduced   systolic function. Peak velocity (S): 229 cm/s. Mean gradient   (S): 15 mm Hg. - Mitral valve: Calcified annulus. There was mild regurgitation. - Left atrium: The atrium was severely dilated. - Right ventricle: The cavity size was normal. Wall thickness was   normal. Systolic function was normal. - Tricuspid valve: There was no regurgitation.  EKG:  EKG is not  ordered today. The ekg ordered today demonstrates   Recent Labs: 01/04/2016: ALT 20; BUN 37; Creat 1.10; Potassium 4.5; Sodium 137   Lipid Panel    Component Value Date/Time   CHOL 135 01/04/2016 1058   TRIG 122 01/04/2016 1058   HDL 61 01/04/2016 1058   CHOLHDL 2.2 01/04/2016 1058   VLDL 24 01/04/2016 1058   LDLCALC 50 01/04/2016 1058      Wt Readings from Last 3 Encounters:  06/14/16 154 lb (69.9 kg)  01/04/16 156 lb (70.8 kg)  11/28/15 150 lb (68 kg)     Other studies Reviewed: Additional studies/ records that were reviewed today include: . Review of the above records demonstrates:    Assessment and Plan:   1. CAD without angina: She has no chest pain suggestive of angina. Will continue Plavix, statin and beta blocker.  Will stop ASA.   2. Ischemic cardiomyopathy: LVEF 30-35% by echo May 2017. Continue beta blocker and Ace-inh. Check BMET today.   3. Chronic systolic CHF: Volume status is normal today. Continue Lasix.   4. LBBB: Chronic  5. Mitral regurgitation: Mild by echo 2017  6. Aortic stenosis: Moderate by  echo 2017 but likely that the gradient is underreported due to her LV dysfunction. No angina, dizziness, dyspnea.   7. Hyperlipidemia: Will continue statin. Will check lipids today. Check LFTs  Current medicines are reviewed at length with the patient today.  The patient does not have concerns regarding medicines.  The following changes have been made:  no change  Labs/ tests ordered today include:   Orders Placed This Encounter  Procedures  . Comp Met (CMET)  . Lipid Profile    Disposition:   FU with me in 6 months  Signed, Lauree Chandler, MD 06/14/2016 8:26 AM    [facility] [address], [place], [place]  [address] Phone: [phone]; Fax: [phone]

## 2016-06-27 ENCOUNTER — Telehealth: Payer: Self-pay | Admitting: Cardiovascular Disease

## 2016-06-27 NOTE — Telephone Encounter (Signed)
New message    Pt is calling stating she is returning call from last week from RN.

## 2016-06-27 NOTE — Telephone Encounter (Signed)
I returned call but voicemail not set up

## 2016-06-27 NOTE — Telephone Encounter (Signed)
Pt's daughter returned call and said someone from our office had left voicemail on her phone on Friday to call office.  I do not see call placed from our office.  Daughter reports she and pt are aware of pt's recent lab results.  Daughter does not have any other questions.

## 2016-07-02 ENCOUNTER — Other Ambulatory Visit: Payer: Self-pay | Admitting: Cardiovascular Disease

## 2016-07-04 ENCOUNTER — Ambulatory Visit: Payer: Medicare Other | Admitting: Cardiovascular Disease

## 2016-07-06 DIAGNOSIS — H18412 Arcus senilis, left eye: Secondary | ICD-10-CM | POA: Diagnosis not present

## 2016-07-06 DIAGNOSIS — H18411 Arcus senilis, right eye: Secondary | ICD-10-CM | POA: Diagnosis not present

## 2016-07-06 DIAGNOSIS — H02839 Dermatochalasis of unspecified eye, unspecified eyelid: Secondary | ICD-10-CM | POA: Diagnosis not present

## 2016-07-06 DIAGNOSIS — Z961 Presence of intraocular lens: Secondary | ICD-10-CM | POA: Diagnosis not present

## 2016-07-06 DIAGNOSIS — H26492 Other secondary cataract, left eye: Secondary | ICD-10-CM | POA: Diagnosis not present

## 2016-07-23 DIAGNOSIS — H4302 Vitreous prolapse, left eye: Secondary | ICD-10-CM | POA: Diagnosis not present

## 2016-08-06 ENCOUNTER — Ambulatory Visit (INDEPENDENT_AMBULATORY_CARE_PROVIDER_SITE_OTHER): Payer: Medicare Other | Admitting: Orthopaedic Surgery

## 2016-08-06 DIAGNOSIS — M25511 Pain in right shoulder: Secondary | ICD-10-CM | POA: Diagnosis not present

## 2016-08-06 DIAGNOSIS — M25512 Pain in left shoulder: Secondary | ICD-10-CM

## 2016-08-06 DIAGNOSIS — G8929 Other chronic pain: Secondary | ICD-10-CM

## 2016-08-06 MED ORDER — LIDOCAINE HCL 1 % IJ SOLN
3.0000 mL | INTRAMUSCULAR | Status: AC | PRN
  Administered 2016-08-06: 3 mL

## 2016-08-06 MED ORDER — METHYLPREDNISOLONE ACETATE 40 MG/ML IJ SUSP
40.0000 mg | INTRAMUSCULAR | Status: AC | PRN
  Administered 2016-08-06: 40 mg via INTRA_ARTICULAR

## 2016-08-06 NOTE — Progress Notes (Signed)
   Procedure Note  Patient: Beth Johnston             Date of Birth: [birth year]           MRN: [id number]             Visit Date: 08/06/2016  Procedures: Visit Diagnoses: Chronic pain of both shoulders  Large Joint Inj Date/Time: 08/06/2016 2:40 PM Performed by: Mcarthur Rossetti Authorized by: Mcarthur Rossetti   Location:  Shoulder Site:  R subacromial bursa Ultrasound Guidance: No   Fluoroscopic Guidance: No   Arthrogram: No   Medications:  3 mL lidocaine 1 %; 40 mg methylPREDNISolone acetate 40 MG/ML Large Joint Inj Date/Time: 08/06/2016 2:40 PM Performed by: Mcarthur Rossetti Authorized by: Mcarthur Rossetti   Location:  Shoulder Site:  L subacromial bursa Ultrasound Guidance: No   Fluoroscopic Guidance: No   Arthrogram: No   Medications:  3 mL lidocaine 1 %; 40 mg methylPREDNISolone acetate 40 MG/ML   The patient is well-known to me. She has chronic bilateral shoulder pain with chronic arthritis and rotator cuff deficient shoulders. She comes in for time to time for steroid injections in her shoulders. She like to have these done again. Her last injections were almost 6 months ago. She is not inches any other intervention at this point. Injections of helped as well.  On examination both shoulders have limited motion but no deficits in terms of dislocation or malalignment. Both have weakness with abduction and grinding in the shoulder joint.  I agree with providing steroid injection both shoulders today. She knows to wait at least 3 or 4 months between repeat injections. All questions were encouraged and answered.

## 2016-08-13 DIAGNOSIS — D225 Melanocytic nevi of trunk: Secondary | ICD-10-CM | POA: Diagnosis not present

## 2016-08-13 DIAGNOSIS — L821 Other seborrheic keratosis: Secondary | ICD-10-CM | POA: Diagnosis not present

## 2016-08-13 DIAGNOSIS — L570 Actinic keratosis: Secondary | ICD-10-CM | POA: Diagnosis not present

## 2016-09-03 ENCOUNTER — Other Ambulatory Visit: Payer: Self-pay

## 2016-09-03 MED ORDER — LOSARTAN POTASSIUM 25 MG PO TABS: 25.0000 mg | ORAL_TABLET | Freq: Every day | ORAL | 0 refills | Status: DC

## 2016-09-09 ENCOUNTER — Other Ambulatory Visit: Payer: Self-pay | Admitting: Cardiovascular Disease

## 2016-10-05 ENCOUNTER — Ambulatory Visit (INDEPENDENT_AMBULATORY_CARE_PROVIDER_SITE_OTHER): Payer: Medicare Other | Admitting: Orthopaedic Surgery

## 2016-10-05 ENCOUNTER — Encounter (INDEPENDENT_AMBULATORY_CARE_PROVIDER_SITE_OTHER): Payer: Self-pay | Admitting: Orthopaedic Surgery

## 2016-10-05 DIAGNOSIS — M7062 Trochanteric bursitis, left hip: Secondary | ICD-10-CM

## 2016-10-05 NOTE — Progress Notes (Signed)
Office Visit Note   Patient: Beth Johnston           Date of Birth: 03-28-1925           MRN: [id number] Visit Date: 10/05/2016              Requested by: No referring provider defined for this encounter. PCP: Patient, No Pcp Per   Assessment & Plan: Visit Diagnoses:  1. Trochanteric bursitis, left hip     Plan: Trochanteric injection performed she tolerated this well and was able to ambulate with improvement in her symptoms. Follow-up as needed.  Follow-Up Instructions: No Follow-up on file.   Orders:  No orders of the defined types were placed in this encounter.  No orders of the defined types were placed in this encounter.     Procedures: Large Joint Inj Date/Time: 10/08/2016 9:03 AM Performed by: Marybelle Killings Authorized by: Rodell Perna C   Location:  Hip Site:  L greater trochanter Needle Size:  22 G Approach:  Lateral Ultrasound Guidance: No   Fluoroscopic Guidance: No   Arthrogram: No   Medications:  0.5 mL lidocaine 1 %; 2 mL bupivacaine 0.25 %; 40 mg methylPREDNISolone acetate 40 MG/ML Aspiration Attempted: No       Clinical Data: No additional findings.   Subjective: Chief Complaint  Patient presents with  . Left Hip - Pain    HPI 83 year old female with left lateral trochanteric pain this been present for greater than one month. She states getting radiographic talus having difficulty walking. She's had previous intra-articular injection in her hip in 2014. Minimal discomfort on the right side. No numbness or tingling to her feet, chills or fever. She denies claudication symptoms. She has rotator cuff deficiency and ongoing shoulder problems.  Review of Systems 14 point review of systems is updated unchanged from 02/27/2016. She's had previous cardiac catheterization 2015. Otherwise negative as it pertains to her history of present illness   Objective: Vital Signs: There were no vitals taken for this visit.  Physical Exam  Constitutional:  She is oriented to person, place, and time. She appears well-developed.  HENT:  Head: Normocephalic.  Right Ear: External ear normal.  Left Ear: External ear normal.  Eyes: Pupils are equal, round, and reactive to light.  Neck: No tracheal deviation present. No thyromegaly present.  Cardiovascular: Normal rate.   Pulmonary/Chest: Effort normal.  Abdominal: Soft.  Musculoskeletal:  Negative straight leg raise 90 has palpable pedal pulse. Some discomfort with internal rotation of the left hip. Lateral trochanter is exquisitely tender on the left minimal on the right. Good quad strength. Anterior tib EHL is strong.  Neurological: She is alert and oriented to person, place, and time.  Skin: Skin is warm and dry.  Psychiatric: She has a normal mood and affect. Her behavior is normal.    Ortho Exam  Specialty Comments:  No specialty comments available.  Imaging: No results found.   PMFS History: Patient Active Problem List   Diagnosis Date Noted  . Chronic pain of both shoulders 02/27/2016  . Acute heart failure ([place])   . Elevated troponin   . Stented coronary artery   . Acute respiratory failure ([place]) 01/02/2014  . CAD S/P high risk LAD PCI/DES 12/30/13 01/02/2014  . Pseudoaneurysm following PCI 01/02/2014  . Blood loss anemia 01/02/2014  . Acute systolic CHF (congestive heart failure) ([place])   . NSTEMI (non-ST elevated myocardial infarction) ([place]) 12/26/2013  . Mitral regurgitation 12/26/2013  . Cardiomyopathy,  ischemic-EF 15%-pre PCI 12/26/2013  . LBBB (left bundle branch block) 12/25/2013  . Asthma, persistent controlled 05/28/2013  . Upper airway cough syndrome 05/28/2013  . Cough 05/11/2013   Past Medical History:  Diagnosis Date  . Arthritis    "mostly my hands" (12/25/2013)  . Asthma   . Ischemic cardiomyopathy    a. EF 15%  . PONV (postoperative nausea and vomiting)   . Upper airway cough syndrome    Post-nasal drip    Family History  Problem Relation Age  of Onset  . Cerebral aneurysm Mother        died at age 69  . Healthy Father        died of old age    Past Surgical History:  Procedure Laterality Date  . APPENDECTOMY  1960's  . CARDIAC CATHETERIZATION  12/28/2013   Procedure: RIGHT/LEFT HEART CATH AND CORONARY ANGIOGRAPHY;  Surgeon: Troy Sine, MD;  Location: [facility] CATH LAB;  Service: Cardiovascular;;  . CYSTOCELE REPAIR  12/18/2011   Procedure: ANTERIOR REPAIR (CYSTOCELE);  Surgeon: Gus Height, MD;  Location: [place];  Service: Gynecology;  Laterality: N/A;  . JOINT REPLACEMENT    . JOINT REPLACEMENT Right 1980's   "ring finger"  . PERCUTANEOUS CORONARY STENT INTERVENTION (PCI-S) N/A 12/30/2013   Procedure: PERCUTANEOUS CORONARY STENT INTERVENTION (PCI-S);  Surgeon: Burnell Blanks, MD;  Location: [facility] CATH LAB;  Service: Cardiovascular;  Laterality: N/A;  . REPLACEMENT TOTAL KNEE BILATERAL Bilateral 2009   Social History   Occupational History  . Not on file.   Social History Main Topics  . Smoking status: Never Smoker  . Smokeless tobacco: Never Used  . Alcohol use No  . Drug use: No  . Sexual activity: Not on file

## 2016-10-08 DIAGNOSIS — M7062 Trochanteric bursitis, left hip: Secondary | ICD-10-CM

## 2016-10-08 MED ORDER — METHYLPREDNISOLONE ACETATE 40 MG/ML IJ SUSP
40.0000 mg | INTRAMUSCULAR | Status: AC | PRN
  Administered 2016-10-08: 40 mg via INTRA_ARTICULAR

## 2016-10-08 MED ORDER — LIDOCAINE HCL 1 % IJ SOLN
0.5000 mL | INTRAMUSCULAR | Status: AC | PRN
  Administered 2016-10-08: .5 mL

## 2016-10-08 MED ORDER — BUPIVACAINE HCL 0.25 % IJ SOLN
2.0000 mL | INTRAMUSCULAR | Status: AC | PRN
  Administered 2016-10-08: 2 mL via INTRA_ARTICULAR

## 2016-11-17 ENCOUNTER — Other Ambulatory Visit: Payer: Self-pay | Admitting: Cardiovascular Disease

## 2016-11-23 ENCOUNTER — Other Ambulatory Visit (INDEPENDENT_AMBULATORY_CARE_PROVIDER_SITE_OTHER): Payer: Self-pay | Admitting: Orthopaedic Surgery

## 2016-11-23 ENCOUNTER — Other Ambulatory Visit: Payer: Self-pay | Admitting: Cardiovascular Disease

## 2016-12-03 ENCOUNTER — Encounter: Payer: Self-pay | Admitting: Cardiovascular Disease

## 2016-12-03 ENCOUNTER — Ambulatory Visit (INDEPENDENT_AMBULATORY_CARE_PROVIDER_SITE_OTHER): Payer: Medicare Other | Admitting: Cardiovascular Disease

## 2016-12-03 VITALS — BP 90/58 | HR 84 | Resp 16 | Ht 62.0 in | Wt 159.4 lb

## 2016-12-03 DIAGNOSIS — I2510 Atherosclerotic heart disease of native coronary artery without angina pectoris: Secondary | ICD-10-CM

## 2016-12-03 DIAGNOSIS — I255 Ischemic cardiomyopathy: Secondary | ICD-10-CM

## 2016-12-03 DIAGNOSIS — I350 Nonrheumatic aortic (valve) stenosis: Secondary | ICD-10-CM | POA: Diagnosis not present

## 2016-12-03 DIAGNOSIS — I447 Left bundle-branch block, unspecified: Secondary | ICD-10-CM | POA: Diagnosis not present

## 2016-12-03 DIAGNOSIS — I5022 Chronic systolic (congestive) heart failure: Secondary | ICD-10-CM

## 2016-12-03 DIAGNOSIS — I340 Nonrheumatic mitral (valve) insufficiency: Secondary | ICD-10-CM

## 2016-12-03 NOTE — Patient Instructions (Signed)
Medication Instructions:  Your physician recommends that you continue on your current medications as directed. Please refer to the Current Medication list given to you today.   Labwork: none  Testing/Procedures: Your physician has requested that you have an echocardiogram. Echocardiography is a painless test that uses sound waves to create images of your heart. It provides your doctor with information about the size and shape of your heart and how well your heart's chambers and valves are working. This procedure takes approximately one hour. There are no restrictions for this procedure.  To be done in May 2019    Follow-Up: Your physician recommends that you schedule a follow-up appointment in: May or June.  Please call our office in February to schedule this appointment    Any Other Special Instructions Will Be Listed Below (If Applicable).     If you need a refill on your cardiac medications before your next appointment, please call your pharmacy.

## 2016-12-03 NOTE — Progress Notes (Signed)
Chief Complaint  Patient presents with  . Follow-up   History of Present Illness: 83 yo female with history of CAD, ischemic cardiomyopathy, LBBB, mitral regurgitation and aortic stenosis who is here today for cardiac follow up. She was admitted to [facility] in November of 2015 with a NSTEMI and was found to have severe calcific disease in the LAD by cath 12/28/13. LVEF was 10%. She refused CABG and underwent high risk PCI/rotablator atherectomy of the LAD with Impella LV support. 2 drug eluting stents were deployed in the LAD. Echo May 2017 with LVEF=30-35%, mild to moderate AS.   She is here today for follow up. The patient denies any chest pain, dyspnea, palpitations, lower extremity edema, orthopnea, PND, dizziness, near syncope or syncope. She feels great. She just got back from a cruise to the [place].   Primary care provider: Patient, No Pcp Per  Past Medical History:  Diagnosis Date  . Arthritis    "mostly my hands" (12/25/2013)  . Asthma   . Ischemic cardiomyopathy    a. EF 15%  . PONV (postoperative nausea and vomiting)   . Upper airway cough syndrome    Post-nasal drip    Past Surgical History:  Procedure Laterality Date  . APPENDECTOMY  1960's  . JOINT REPLACEMENT    . JOINT REPLACEMENT Right 1980's   "ring finger"  . REPLACEMENT TOTAL KNEE BILATERAL Bilateral 2009    Current Outpatient Medications  Medication Sig Dispense Refill  . amoxicillin (AMOXIL) 500 MG capsule Take 4 capsules by mouth as needed. (Take 1 hour prior for dental procedures)  0  . atorvastatin (LIPITOR) 40 MG tablet Take 0.5 tablets (20 mg total) by mouth daily. 90 tablet 3  . carvedilol (COREG) 6.25 MG tablet TAKE ONE TABLET BY MOUTH TWICE DAILY WITH A MEAL 60 tablet 7  . clopidogrel (PLAVIX) 75 MG tablet TAKE ONE TABLET BY MOUTH ONCE DAILY WITH BREAKFAST 90 tablet 1  . furosemide (LASIX) 40 MG tablet TAKE 1 TABLET (40 MG TOTAL) BY MOUTH DAILY. 90 tablet 2  . losartan (COZAAR) 25 MG tablet  TAKE 1 TABLET BY MOUTH EVERY DAY 90 tablet 1  . meloxicam (MOBIC) 7.5 MG tablet TAKE 1 TABLET BY MOUTH EVERY DAY TO 2 TIMES A DAY AS NEEDED FOR PAIN /INFLAMMATION 60 tablet 0  . spironolactone (ALDACTONE) 25 MG tablet TAKE ONE-HALF TABLET BY MOUTH TWICE DAILY AT  8AM  AND  6PM 90 tablet 3   No current facility-administered medications for this visit.     Allergies  Allergen Reactions  . Lisinopril Cough    Social History   Socioeconomic History  . Marital status: Widowed    Spouse name: Not on file  . Number of children: Not on file  . Years of education: Not on file  . Highest education level: Not on file  Social Needs  . Financial resource strain: Not on file  . Food insecurity - worry: Not on file  . Food insecurity - inability: Not on file  . Transportation needs - medical: Not on file  . Transportation needs - non-medical: Not on file  Occupational History  . Not on file  Tobacco Use  . Smoking status: Never Smoker  . Smokeless tobacco: Never Used  Substance and Sexual Activity  . Alcohol use: No  . Drug use: No  . Sexual activity: Not on file  Other Topics Concern  . Not on file  Social History Narrative  . Not on file  Family History  Problem Relation Age of Onset  . Cerebral aneurysm Mother        died at age 37  . Healthy Father        died of old age    Review of Systems:  As stated in the HPI and otherwise negative.   BP (!) 90/58   Pulse 84   Resp 16   Ht 5\' 2"  (1.575 m)   Wt 159 lb 6.4 oz (72.3 kg)   SpO2 98%   BMI 29.15 kg/m   Physical Examination:  General: Well developed, well nourished, NAD  HEENT: OP clear, mucus membranes moist  SKIN: warm, dry. No rashes. Neuro: No focal deficits  Musculoskeletal: Muscle strength 5/5 all ext  Psychiatric: Mood and affect normal  Neck: No JVD, no carotid bruits, no thyromegaly, no lymphadenopathy.  Lungs:Clear bilaterally, no wheezes, rhonci, crackles Cardiovascular: Regular rate and rhythm.  No murmurs, gallops or rubs. Abdomen:Soft. Bowel sounds present. Non-tender.  Extremities: No lower extremity edema. Pulses are 2 + in the bilateral DP/PT.  Echo 06/13/15: - Left ventricle: The cavity size was normal. Wall thickness was   increased in a pattern of moderate LVH. Systolic function was   moderately to severely reduced. The estimated ejection fraction   was in the range of 30% to 35%. Doppler parameters are consistent   with abnormal left ventricular relaxation (grade 1 diastolic   dysfunction). Doppler parameters are consistent with high   ventricular filling pressure. - Regional wall motion abnormality: Hypokinesis of the mid   anteroseptal, basal-mid inferoseptal, and apical septal   myocardium. - Ventricular septum: Septal motion showed abnormal function and   dyssynergy. - Aortic valve: Trileaflet; moderately thickened, moderately   calcified leaflets. Valve mobility was restricted. There was mild   stenosis. Stenosis severity may be underestimated due to reduced   systolic function. Peak velocity (S): 229 cm/s. Mean gradient   (S): 15 mm Hg. - Mitral valve: Calcified annulus. There was mild regurgitation. - Left atrium: The atrium was severely dilated. - Right ventricle: The cavity size was normal. Wall thickness was   normal. Systolic function was normal. - Tricuspid valve: There was no regurgitation.  EKG:  EKG is  ordered today. The ekg ordered today demonstrates NSR, rate 84 bpm. LAE. LBBB  Recent Labs: 06/14/2016: ALT 16; BUN 29; Creatinine, Ser 1.08; Potassium 4.8; Sodium 141   Lipid Panel    Component Value Date/Time   CHOL 145 06/14/2016 0826   TRIG 77 06/14/2016 0826   HDL 60 06/14/2016 0826   CHOLHDL 2.4 06/14/2016 0826   CHOLHDL 2.2 01/04/2016 1058   VLDL 24 01/04/2016 1058   LDLCALC 70 06/14/2016 0826     Wt Readings from Last 3 Encounters:  12/03/16 159 lb 6.4 oz (72.3 kg)  06/14/16 154 lb (69.9 kg)  01/04/16 156 lb (70.8 kg)      Other studies Reviewed: Additional studies/ records that were reviewed today include: . Review of the above records demonstrates:    Assessment and Plan:   1. CAD without angina: No chest pain suggestive of angina. Will continue Plavix, statin and beta blocker.    2. Ischemic cardiomyopathy: LVEF 30-35% by echo May 2017. -Continue beta blocker, aldactone and ARB  3. Chronic systolic CHF: Weight is stable. Volume status is ok. Continue Lasix.   4. LBBB: Chronic  5. Mitral regurgitation: Mild by echo in 2017.   6. Aortic stenosis: She had moderate AS by echo in 2017 but likely  low gradient/low output severe AS due to LV systolic dysfunction. She has no symptoms. Will repeat echo May 2019.  7. Hyperlipidemia: Continue statin.   Current medicines are reviewed at length with the patient today.  The patient does not have concerns regarding medicines.  The following changes have been made:  no change  Labs/ tests ordered today include:   Orders Placed This Encounter  Procedures  . EKG 12-Lead  . ECHOCARDIOGRAM COMPLETE    Disposition:   FU with me in 6 months  Signed, Lauree Chandler, MD 12/04/2016 7:09 AM    [facility] [place] [place], [place], [place]  [address] Phone: [phone]; Fax: [phone]

## 2016-12-19 ENCOUNTER — Other Ambulatory Visit (INDEPENDENT_AMBULATORY_CARE_PROVIDER_SITE_OTHER): Payer: Self-pay

## 2016-12-19 MED ORDER — MELOXICAM 7.5 MG PO TABS: 7.5000 mg | ORAL_TABLET | Freq: Two times a day (BID) | ORAL | 0 refills | Status: DC

## 2016-12-23 DIAGNOSIS — J209 Acute bronchitis, unspecified: Secondary | ICD-10-CM | POA: Diagnosis not present

## 2016-12-23 DIAGNOSIS — J069 Acute upper respiratory infection, unspecified: Secondary | ICD-10-CM | POA: Diagnosis not present

## 2017-01-03 ENCOUNTER — Telehealth: Payer: Self-pay | Admitting: Cardiovascular Disease

## 2017-01-03 NOTE — Telephone Encounter (Signed)
Disability Parking Pla-Card emailed to me by Patients Daughter. Placed in [place].

## 2017-01-28 ENCOUNTER — Other Ambulatory Visit: Payer: Self-pay | Admitting: Cardiovascular Disease

## 2017-01-28 MED ORDER — CARVEDILOL 6.25 MG PO TABS: 6.2500 mg | ORAL_TABLET | Freq: Two times a day (BID) | ORAL | 3 refills | Status: DC

## 2017-02-11 ENCOUNTER — Other Ambulatory Visit: Payer: Self-pay | Admitting: Cardiovascular Disease

## 2017-02-11 MED ORDER — LOSARTAN POTASSIUM 25 MG PO TABS: 25.0000 mg | ORAL_TABLET | Freq: Every day | ORAL | 3 refills | Status: DC

## 2017-03-16 ENCOUNTER — Other Ambulatory Visit: Payer: Self-pay | Admitting: Cardiovascular Disease

## 2017-03-25 ENCOUNTER — Telehealth: Payer: Self-pay | Admitting: Internal Medicine

## 2017-03-25 ENCOUNTER — Telehealth: Payer: Self-pay | Admitting: Cardiovascular Disease

## 2017-03-25 NOTE — Telephone Encounter (Signed)
Called and spoke with patients daughter, she is aware of results and verbalized understanding.

## 2017-03-25 NOTE — Telephone Encounter (Signed)
New message  Pt daughter verbalized that she is calling for the RN  Believes that mother has bronchitis and she want to ask Dr.McAlhany will call in a   prescription for an antibiotic. Pt do not have a PCP.

## 2017-03-25 NOTE — Telephone Encounter (Signed)
Spoke with patient's daughter Al Corpus. She stated that her mother has developed bronchitis again. Productive cough with green phlegm. Denies any body aches or fevers. Per Joni, patient used to see CY but never followed up due to her not being sick. Patient also does not have a PCP. Asked Joni if she could remember the year that she saw CY because per her EMR, she has only seen VS. She could not remember.   Joni does not want to take her to an urgent care in fear that because of her age, she will pick up another virus.   CY, please advise if it ok for [name] to work patient into your schedule. Thanks!

## 2017-03-25 NOTE — Telephone Encounter (Signed)
I'm fully booked. There is risk of virus in any crowd, including our waiting room. I would suggest putting a mask on her and taking her to an Urgent Care, so she can at least be seen. Then re-establish at this office as able. She really also needs to get a PCP.

## 2017-03-25 NOTE — Telephone Encounter (Signed)
I spoke with pt's daughter and told her Dr. Angelena Form could not prescribe an antibiotic.  I recommended pt be seen in Urgent Care.

## 2017-03-27 DIAGNOSIS — L819 Disorder of pigmentation, unspecified: Secondary | ICD-10-CM | POA: Diagnosis not present

## 2017-03-27 DIAGNOSIS — L570 Actinic keratosis: Secondary | ICD-10-CM | POA: Diagnosis not present

## 2017-03-28 ENCOUNTER — Other Ambulatory Visit (INDEPENDENT_AMBULATORY_CARE_PROVIDER_SITE_OTHER): Payer: Self-pay

## 2017-03-28 MED ORDER — MELOXICAM 7.5 MG PO TABS: 7.5000 mg | ORAL_TABLET | Freq: Two times a day (BID) | ORAL | 0 refills | Status: DC

## 2017-04-24 DIAGNOSIS — D0471 Carcinoma in situ of skin of right lower limb, including hip: Secondary | ICD-10-CM | POA: Diagnosis not present

## 2017-04-30 ENCOUNTER — Other Ambulatory Visit: Payer: Self-pay | Admitting: Cardiovascular Disease

## 2017-05-08 ENCOUNTER — Encounter (HOSPITAL_COMMUNITY): Payer: Self-pay

## 2017-05-08 ENCOUNTER — Emergency Department (HOSPITAL_COMMUNITY)
Admission: EM | Admit: 2017-05-08 | Discharge: 2017-05-08 | Disposition: A | Discharge status: Home / Self Care | Payer: Medicare Other | Attending: Emergency Medicine | Admitting: Emergency Medicine

## 2017-05-08 ENCOUNTER — Other Ambulatory Visit: Payer: Self-pay

## 2017-05-08 ENCOUNTER — Emergency Department (HOSPITAL_COMMUNITY): Payer: Medicare Other

## 2017-05-08 DIAGNOSIS — S098XXA Other specified injuries of head, initial encounter: Secondary | ICD-10-CM | POA: Diagnosis not present

## 2017-05-08 DIAGNOSIS — J45909 Unspecified asthma, uncomplicated: Secondary | ICD-10-CM | POA: Diagnosis not present

## 2017-05-08 DIAGNOSIS — Z79899 Other long term (current) drug therapy: Secondary | ICD-10-CM | POA: Insufficient documentation

## 2017-05-08 DIAGNOSIS — Y999 Unspecified external cause status: Secondary | ICD-10-CM | POA: Insufficient documentation

## 2017-05-08 DIAGNOSIS — Y939 Activity, unspecified: Secondary | ICD-10-CM | POA: Diagnosis not present

## 2017-05-08 DIAGNOSIS — Z96691 Finger-joint replacement of right hand: Secondary | ICD-10-CM | POA: Diagnosis not present

## 2017-05-08 DIAGNOSIS — I2510 Atherosclerotic heart disease of native coronary artery without angina pectoris: Secondary | ICD-10-CM | POA: Diagnosis not present

## 2017-05-08 DIAGNOSIS — S12400A Unspecified displaced fracture of fifth cervical vertebra, initial encounter for closed fracture: Secondary | ICD-10-CM | POA: Diagnosis not present

## 2017-05-08 DIAGNOSIS — R51 Headache: Secondary | ICD-10-CM | POA: Diagnosis not present

## 2017-05-08 DIAGNOSIS — W01198A Fall on same level from slipping, tripping and stumbling with subsequent striking against other object, initial encounter: Secondary | ICD-10-CM | POA: Insufficient documentation

## 2017-05-08 DIAGNOSIS — S0081XA Abrasion of other part of head, initial encounter: Secondary | ICD-10-CM | POA: Diagnosis not present

## 2017-05-08 DIAGNOSIS — I5020 Unspecified systolic (congestive) heart failure: Secondary | ICD-10-CM | POA: Diagnosis not present

## 2017-05-08 DIAGNOSIS — Z955 Presence of coronary angioplasty implant and graft: Secondary | ICD-10-CM | POA: Insufficient documentation

## 2017-05-08 DIAGNOSIS — Z7902 Long term (current) use of antithrombotics/antiplatelets: Secondary | ICD-10-CM | POA: Diagnosis not present

## 2017-05-08 DIAGNOSIS — Y929 Unspecified place or not applicable: Secondary | ICD-10-CM | POA: Diagnosis not present

## 2017-05-08 DIAGNOSIS — S064X0A Epidural hemorrhage without loss of consciousness, initial encounter: Secondary | ICD-10-CM | POA: Diagnosis not present

## 2017-05-08 DIAGNOSIS — Z96653 Presence of artificial knee joint, bilateral: Secondary | ICD-10-CM | POA: Insufficient documentation

## 2017-05-08 DIAGNOSIS — S199XXA Unspecified injury of neck, initial encounter: Secondary | ICD-10-CM | POA: Diagnosis not present

## 2017-05-08 DIAGNOSIS — M542 Cervicalgia: Secondary | ICD-10-CM | POA: Diagnosis not present

## 2017-05-08 DIAGNOSIS — S0003XA Contusion of scalp, initial encounter: Secondary | ICD-10-CM | POA: Diagnosis not present

## 2017-05-08 MED ORDER — ACETAMINOPHEN 325 MG PO TABS: 650.0000 mg | ORAL_TABLET | Freq: Four times a day (QID) | ORAL | 0 refills | Status: DC | PRN

## 2017-05-08 NOTE — ED Notes (Signed)
Neurosurgery paged to Dr. Kathrynn Humble @ [phone] due to a new C5 FX after fall. Spoke w/ Lorriane Shire during consult.

## 2017-05-08 NOTE — ED Triage Notes (Signed)
Pt arrived via [facility] from work where pt tripped up top stop and hit head on brick wall. Pt denies being on blood thinners. EMS reports laceration and hematoma to forehead. Bleeding controlled at this time. Denies LOC, Pt A&OX4.

## 2017-05-08 NOTE — Discharge Instructions (Signed)
You had a fall and as a result have a cervical spine fracture. The fracture is stable, which is reassuring.  Please take precautions and keep the aspen collar on at all times. Return to the ER immediately if you start having new or worsening numbness, weakness (in arms or legs), urinary incontinence, urinary retention, bowel incontinence, pins and needle sensation by your private area.  Schedule an appointment with Dr. Ronnald Ramp, NeuroSurgery for a day right after your [place] trip.

## 2017-05-13 ENCOUNTER — Telehealth (INDEPENDENT_AMBULATORY_CARE_PROVIDER_SITE_OTHER): Payer: Self-pay | Admitting: Physician Assistant

## 2017-05-13 NOTE — Telephone Encounter (Signed)
Returned call to [facility]  Left message to return call

## 2017-05-15 ENCOUNTER — Other Ambulatory Visit: Payer: Self-pay | Admitting: Cardiovascular Disease

## 2017-05-15 NOTE — ED Provider Notes (Signed)
[facility] EMERGENCY DEPARTMENT Provider Note   CSN: [id number] Arrival date & time: 05/08/17  1131     History   Chief Complaint Chief Complaint  Patient presents with  . Fall  . Head Injury    HPI Beth Johnston is a 84 y.o. female.  HPI 84 y/o female comes in after a mechanical fall. Pt arrives from her home and she reports that she tripped and fell down, and the process hitting the back of her head on a brick wall.  Patient is complaining of headache, otherwise she denies any upper extremity, lower extremity pain.  Review of system is also negative for any loss of consciousness, altered mental status, seizures, new numbness or tingling.  Patient is not on any blood thinners.   Past Medical History:  Diagnosis Date  . Arthritis    "mostly my hands" (12/25/2013)  . Asthma   . Ischemic cardiomyopathy    a. EF 15%  . PONV (postoperative nausea and vomiting)   . Upper airway cough syndrome    Post-nasal drip    Patient Active Problem List   Diagnosis Date Noted  . Chronic pain of both shoulders 02/27/2016  . Acute heart failure ([place])   . Elevated troponin   . Stented coronary artery   . Acute respiratory failure ([place]) 01/02/2014  . CAD S/P high risk LAD PCI/DES 12/30/13 01/02/2014  . Pseudoaneurysm following PCI 01/02/2014  . Blood loss anemia 01/02/2014  . Acute systolic CHF (congestive heart failure) ([place])   . NSTEMI (non-ST elevated myocardial infarction) ([place]) 12/26/2013  . Mitral regurgitation 12/26/2013  . Cardiomyopathy, ischemic-EF 15%-pre PCI 12/26/2013  . LBBB (left bundle branch block) 12/25/2013  . Asthma, persistent controlled 05/28/2013  . Upper airway cough syndrome 05/28/2013  . Cough 05/11/2013    Past Surgical History:  Procedure Laterality Date  . APPENDECTOMY  1960's  . CARDIAC CATHETERIZATION  12/28/2013   Procedure: RIGHT/LEFT HEART CATH AND CORONARY ANGIOGRAPHY;  Surgeon: Troy Sine, MD;  Location: [facility] CATH LAB;   Service: Cardiovascular;;  . CYSTOCELE REPAIR  12/18/2011   Procedure: ANTERIOR REPAIR (CYSTOCELE);  Surgeon: Gus Height, MD;  Location: [facility];  Service: Gynecology;  Laterality: N/A;  . JOINT REPLACEMENT    . JOINT REPLACEMENT Right 1980's   "ring finger"  . PERCUTANEOUS CORONARY STENT INTERVENTION (PCI-S) N/A 12/30/2013   Procedure: PERCUTANEOUS CORONARY STENT INTERVENTION (PCI-S);  Surgeon: Burnell Blanks, MD;  Location: [facility] CATH LAB;  Service: Cardiovascular;  Laterality: N/A;  . REPLACEMENT TOTAL KNEE BILATERAL Bilateral 2009     OB History   None      Home Medications    Prior to Admission medications   Medication Sig Start Date End Date Taking? Authorizing Provider  acetaminophen (TYLENOL) 325 MG tablet Take 2 tablets (650 mg total) by mouth every 6 (six) hours as needed. 05/08/17   Varney Biles, MD  amoxicillin (AMOXIL) 500 MG capsule Take 4 capsules by mouth as needed. (Take 1 hour prior for dental procedures) 05/30/15   [provider]  atorvastatin (LIPITOR) 40 MG tablet Take 0.5 tablets (20 mg total) by mouth daily at 6 PM. 03/19/17   Burnell Blanks, MD  carvedilol (COREG) 6.25 MG tablet Take 1 tablet (6.25 mg total) by mouth 2 (two) times daily with a meal. 01/28/17   Burnell Blanks, MD  clopidogrel (PLAVIX) 75 MG tablet TAKE ONE TABLET BY MOUTH ONCE DAILY WITH BREAKFAST 11/23/16   Burnell Blanks, MD  furosemide (LASIX) 40 MG tablet TAKE 1 TABLET (40 MG TOTAL) BY MOUTH DAILY. 04/30/17   Burnell Blanks, MD  losartan (COZAAR) 25 MG tablet Take 1 tablet (25 mg total) by mouth daily. 02/11/17   Burnell Blanks, MD  meloxicam (MOBIC) 7.5 MG tablet Take 1 tablet (7.5 mg total) by mouth 2 (two) times daily. 03/28/17   Mcarthur Rossetti, MD  spironolactone (ALDACTONE) 25 MG tablet TAKE ONE-HALF TABLET BY MOUTH TWICE DAILY AT  8AM  AND  6PM 07/03/16   Burnell Blanks, MD    Family History Family History  Problem  Relation Age of Onset  . Cerebral aneurysm Mother        died at age 31  . Healthy Father        died of old age    Social History Social History   Tobacco Use  . Smoking status: Never Smoker  . Smokeless tobacco: Never Used  Substance Use Topics  . Alcohol use: No  . Drug use: No     Allergies   Lisinopril   Review of Systems Review of Systems  Constitutional: Positive for activity change.  Respiratory: Negative for shortness of breath.   Cardiovascular: Negative for chest pain.  Gastrointestinal: Negative for abdominal pain.  Neurological: Positive for headaches. Negative for dizziness, tremors, syncope, facial asymmetry and weakness.  Hematological: Does not bruise/bleed easily.  All other systems reviewed and are negative.    Physical Exam Updated Vital Signs BP (!) 119/57 (BP Location: Left Arm)   Pulse 73   Temp 98.2 F (36.8 C) (Oral)   Resp 16   Ht 5\' 5"  (1.651 m)   Wt 70.3 kg (155 lb)   SpO2 95%   BMI 25.79 kg/m   Physical Exam  Constitutional: She is oriented to person, place, and time. She appears well-developed.  HENT:  Head: Normocephalic and atraumatic.  Patient has abrasions to her face, with superficial lacerations only.  Eyes: Pupils are equal, round, and reactive to light. EOM are normal.  Neck:  In c-collar  Cardiovascular: Normal rate.  Pulmonary/Chest: Effort normal.  Abdominal: Bowel sounds are normal.  Musculoskeletal: She exhibits no tenderness.  Neurological: She is alert and oriented to person, place, and time. No cranial nerve deficit. Coordination normal.  Cerebellar exam is normal (finger to nose) Sensory exam normal for bilateral upper and lower extremities - and patient is able to discriminate between sharp and dull. Motor exam is 4+/5   Skin: Skin is warm and dry.  Nursing note and vitals reviewed.    ED Treatments / Results  Labs (all labs ordered are listed, but only abnormal results are displayed) Labs  Reviewed - No data to display  EKG None  Radiology No results found.  Procedures Procedures (including critical care time)  Medications Ordered in ED Medications - No data to display   Initial Impression / Assessment and Plan / ED Course  I have reviewed the triage vital signs and the nursing notes.  Pertinent labs & imaging results that were available during my care of the patient were reviewed by me and considered in my medical decision making (see chart for details).     84 year old female comes in after a mechanical fall.  DDx includes: - Mechanical falls - ICH - Fractures - Contusions - Soft tissue injury  Does not appear that she has any gross deformities or fractures over the long bones.  Patient is ambulated after the incident.  CT scan of  the head and C-spine were ordered as we cannot clear them clinically, and the CT C-spine shows a C5 cervical fracture.  Fortunately the neurologic exam is intact and patient does not have any focal deficits as a result of her C-spine fracture.  Patient has been placed in an Aspen collar, and I spoke with neurosurgery on call.  The neurosurgeon reviewed the images and we discussed the clinical findings, he clears the patient clinically at this time and has requested the patient follow-up with his service in 2-3 weeks.  Patient has been informed of her diagnosis, and strict ER return precautions have been discussed.  Final Clinical Impressions(s) / ED Diagnoses   Final diagnoses:  Closed displaced fracture of fifth cervical vertebra, unspecified fracture morphology, initial encounter [facility])    ED Discharge Orders        Ordered    acetaminophen (TYLENOL) 325 MG tablet  Every 6 hours PRN     05/08/17 1416       Varney Biles, MD 05/16/17 1415

## 2017-05-16 ENCOUNTER — Ambulatory Visit (INDEPENDENT_AMBULATORY_CARE_PROVIDER_SITE_OTHER): Payer: Medicare Other | Admitting: Physician Assistant

## 2017-05-16 ENCOUNTER — Encounter (INDEPENDENT_AMBULATORY_CARE_PROVIDER_SITE_OTHER): Payer: Self-pay | Admitting: Physician Assistant

## 2017-05-16 DIAGNOSIS — M25511 Pain in right shoulder: Secondary | ICD-10-CM

## 2017-05-16 DIAGNOSIS — G8929 Other chronic pain: Secondary | ICD-10-CM

## 2017-05-16 DIAGNOSIS — M25512 Pain in left shoulder: Secondary | ICD-10-CM | POA: Diagnosis not present

## 2017-05-16 MED ORDER — LIDOCAINE HCL 1 % IJ SOLN
3.0000 mL | INTRAMUSCULAR | Status: AC | PRN
  Administered 2017-05-16: 3 mL

## 2017-05-16 MED ORDER — METHYLPREDNISOLONE ACETATE 40 MG/ML IJ SUSP
40.0000 mg | INTRAMUSCULAR | Status: AC | PRN
  Administered 2017-05-16: 40 mg via INTRA_ARTICULAR

## 2017-05-16 NOTE — Progress Notes (Signed)
   Procedure Note  Patient: Beth Johnston             Date of Birth: 1925/04/25           MRN: [id number]             Visit Date: 05/16/2017  HPI: Patient is well-known to Dr. Ninfa Linden service comes in today due to bilateral shoulder pain.  She was last given subacromial injections in both shoulders on 08/06/2016 did well until recently.  Unfortunately she did have a fall upstairs sustained a C5 spinous process fracture which extended into the lamina on the left.  She is due to see a neurosurgeon on April 29. She is in an [employer] .  She did not sustain injuries to either shoulder.  However she is requesting injections in both shoulders today.  Physical exam bilateral shoulders external and internal rotation against resistance 5 out of 5 strength.  She has forward flexion passively left shoulder to only 90 degrees secondary to pain right shoulder to approximately 160 degrees.  Procedures: Visit Diagnoses: Chronic pain of both shoulders  Large Joint Inj: bilateral subacromial bursa on 05/16/2017 5:24 PM Indications: pain Details: 22 G 1.5 in needle, superior approach  Arthrogram: No  Medications (Right): 3 mL lidocaine 1 %; 40 mg methylPREDNISolone acetate 40 MG/ML Medications (Left): 3 mL lidocaine 1 %; 40 mg methylPREDNISolone acetate 40 MG/ML Outcome: tolerated well, no immediate complications Procedure, treatment alternatives, risks and benefits explained, specific risks discussed. Consent was given by the patient. Immediately prior to procedure a time out was called to verify the correct patient, procedure, equipment, support staff and site/side marked as required. Patient was prepped and draped in the usual sterile fashion.    Plan: She will follow-up with [name] on an as-needed basis.  She understands she can only have injections every 3 months bilateral shoulders.  In regards to her neck she did ask if she really needed to wear the collar I advised her to continue with aspirin collar  until she saw the neurosurgeon.

## 2017-05-27 DIAGNOSIS — S12491A Other nondisplaced fracture of fifth cervical vertebra, initial encounter for closed fracture: Secondary | ICD-10-CM | POA: Diagnosis not present

## 2017-06-04 ENCOUNTER — Ambulatory Visit (HOSPITAL_COMMUNITY): Payer: Medicare Other | Attending: Cardiovascular Disease

## 2017-06-04 ENCOUNTER — Other Ambulatory Visit: Payer: Self-pay

## 2017-06-04 DIAGNOSIS — I340 Nonrheumatic mitral (valve) insufficiency: Secondary | ICD-10-CM | POA: Diagnosis not present

## 2017-06-04 DIAGNOSIS — I447 Left bundle-branch block, unspecified: Secondary | ICD-10-CM | POA: Insufficient documentation

## 2017-06-04 DIAGNOSIS — I2510 Atherosclerotic heart disease of native coronary artery without angina pectoris: Secondary | ICD-10-CM | POA: Diagnosis not present

## 2017-06-04 DIAGNOSIS — E785 Hyperlipidemia, unspecified: Secondary | ICD-10-CM | POA: Insufficient documentation

## 2017-06-04 DIAGNOSIS — I517 Cardiomegaly: Secondary | ICD-10-CM | POA: Diagnosis not present

## 2017-06-04 DIAGNOSIS — I5022 Chronic systolic (congestive) heart failure: Secondary | ICD-10-CM | POA: Insufficient documentation

## 2017-06-04 DIAGNOSIS — I255 Ischemic cardiomyopathy: Secondary | ICD-10-CM

## 2017-06-04 DIAGNOSIS — I350 Nonrheumatic aortic (valve) stenosis: Secondary | ICD-10-CM

## 2017-06-05 DIAGNOSIS — L905 Scar conditions and fibrosis of skin: Secondary | ICD-10-CM | POA: Diagnosis not present

## 2017-06-05 DIAGNOSIS — Z85828 Personal history of other malignant neoplasm of skin: Secondary | ICD-10-CM | POA: Diagnosis not present

## 2017-06-05 DIAGNOSIS — L570 Actinic keratosis: Secondary | ICD-10-CM | POA: Diagnosis not present

## 2017-06-12 ENCOUNTER — Ambulatory Visit: Payer: Medicare Other | Admitting: Cardiovascular Disease

## 2017-06-25 ENCOUNTER — Other Ambulatory Visit (INDEPENDENT_AMBULATORY_CARE_PROVIDER_SITE_OTHER): Payer: Self-pay | Admitting: Orthopaedic Surgery

## 2017-07-07 ENCOUNTER — Other Ambulatory Visit: Payer: Self-pay | Admitting: Cardiovascular Disease

## 2017-07-08 ENCOUNTER — Ambulatory Visit (INDEPENDENT_AMBULATORY_CARE_PROVIDER_SITE_OTHER): Payer: Medicare Other | Admitting: Cardiovascular Disease

## 2017-07-08 ENCOUNTER — Encounter: Payer: Self-pay | Admitting: Cardiovascular Disease

## 2017-07-08 VITALS — BP 112/56 | HR 78 | Ht 65.0 in | Wt 158.0 lb

## 2017-07-08 DIAGNOSIS — I340 Nonrheumatic mitral (valve) insufficiency: Secondary | ICD-10-CM

## 2017-07-08 DIAGNOSIS — I350 Nonrheumatic aortic (valve) stenosis: Secondary | ICD-10-CM | POA: Diagnosis not present

## 2017-07-08 DIAGNOSIS — I255 Ischemic cardiomyopathy: Secondary | ICD-10-CM

## 2017-07-08 DIAGNOSIS — I5022 Chronic systolic (congestive) heart failure: Secondary | ICD-10-CM | POA: Diagnosis not present

## 2017-07-08 DIAGNOSIS — I2510 Atherosclerotic heart disease of native coronary artery without angina pectoris: Secondary | ICD-10-CM | POA: Diagnosis not present

## 2017-07-08 NOTE — Patient Instructions (Signed)
Medication Instructions: Your physician recommends that you continue on your current medications as directed. Please refer to the Current Medication list given to you today.   Labwork: TODAY: CMET & LIPIDS   Procedures/Testing: NONE  Follow-Up: Your physician recommends that you schedule a follow-up appointment in: 6 months with Dr.Mcalhany    Any Additional Special Instructions Will Be Listed Below (If Applicable).     If you need a refill on your cardiac medications before your next appointment, please call your pharmacy.

## 2017-07-08 NOTE — Progress Notes (Signed)
Chief Complaint  Patient presents with  . Follow-up    CAD   History of Present Illness: 84 yo female with history of CAD, ischemic cardiomyopathy, LBBB, mitral regurgitation and aortic stenosis who is here today for cardiac follow up. She was admitted to [facility] in November of 2015 with a NSTEMI and was found to have severe calcific disease in the LAD by cath 12/28/13. LVEF was 10%. She refused CABG and underwent high risk PCI/rotablator atherectomy of the LAD with Impella LV support. 2 drug eluting stents were deployed in the LAD. Echo May 2017 with LVEF=30-35%, mild to moderate AS.   She is here today for follow up. The patient denies any chest pain, dyspnea, palpitations, lower extremity edema, orthopnea, PND, dizziness, near syncope or syncope.   Primary care provider:  No primary care doctor  Past Medical History:  Diagnosis Date  . Arthritis    "mostly my hands" (12/25/2013)  . Asthma   . Ischemic cardiomyopathy    a. EF 15%  . PONV (postoperative nausea and vomiting)   . Upper airway cough syndrome    Post-nasal drip    Past Surgical History:  Procedure Laterality Date  . APPENDECTOMY  1960's  . CARDIAC CATHETERIZATION  12/28/2013   Procedure: RIGHT/LEFT HEART CATH AND CORONARY ANGIOGRAPHY;  Surgeon: Troy Sine, MD;  Location: [facility] CATH LAB;  Service: Cardiovascular;;  . CYSTOCELE REPAIR  12/18/2011   Procedure: ANTERIOR REPAIR (CYSTOCELE);  Surgeon: Gus Height, MD;  Location: [facility];  Service: Gynecology;  Laterality: N/A;  . JOINT REPLACEMENT    . JOINT REPLACEMENT Right 1980's   "ring finger"  . PERCUTANEOUS CORONARY STENT INTERVENTION (PCI-S) N/A 12/30/2013   Procedure: PERCUTANEOUS CORONARY STENT INTERVENTION (PCI-S);  Surgeon: Burnell Blanks, MD;  Location: [facility] CATH LAB;  Service: Cardiovascular;  Laterality: N/A;  . REPLACEMENT TOTAL KNEE BILATERAL Bilateral 2009    Current Outpatient Medications  Medication Sig Dispense Refill  . acetaminophen  (TYLENOL) 325 MG tablet Take 2 tablets (650 mg total) by mouth every 6 (six) hours as needed. 30 tablet 0  . amoxicillin (AMOXIL) 500 MG capsule Take 4 capsules by mouth as needed. (Take 1 hour prior for dental procedures)  0  . atorvastatin (LIPITOR) 40 MG tablet Take 0.5 tablets (20 mg total) by mouth daily at 6 PM. 45 tablet 2  . carvedilol (COREG) 6.25 MG tablet Take 1 tablet (6.25 mg total) by mouth 2 (two) times daily with a meal. 180 tablet 3  . clopidogrel (PLAVIX) 75 MG tablet Take 1 tablet (75 mg total) by mouth daily with breakfast. 90 tablet 2  . furosemide (LASIX) 40 MG tablet TAKE 1 TABLET (40 MG TOTAL) BY MOUTH DAILY. 90 tablet 1  . losartan (COZAAR) 25 MG tablet Take 1 tablet (25 mg total) by mouth daily. 90 tablet 3  . meloxicam (MOBIC) 7.5 MG tablet TAKE 1 TABLET (7.5 MG TOTAL) BY MOUTH 2 (TWO) TIMES DAILY. 180 tablet 0  . spironolactone (ALDACTONE) 25 MG tablet TAKE 1/2 (ONE-HALF) TABLET BY MOUTH TWICE DAILY AT  8AM  AND  6PM 90 tablet 3   No current facility-administered medications for this visit.     Allergies  Allergen Reactions  . Lisinopril Cough    Social History   Socioeconomic History  . Marital status: Widowed    Spouse name: Not on file  . Number of children: Not on file  . Years of education: Not on file  . Highest education level: Not  on file  Occupational History  . Not on file  Social Needs  . Financial resource strain: Not on file  . Food insecurity:    Worry: Not on file    Inability: Not on file  . Transportation needs:    Medical: Not on file    Non-medical: Not on file  Tobacco Use  . Smoking status: Never Smoker  . Smokeless tobacco: Never Used  Substance and Sexual Activity  . Alcohol use: No  . Drug use: No  . Sexual activity: Not on file  Lifestyle  . Physical activity:    Days per week: Not on file    Minutes per session: Not on file  . Stress: Not on file  Relationships  . Social connections:    Talks on phone: Not on file     Gets together: Not on file    Attends religious service: Not on file    Active member of club or organization: Not on file    Attends meetings of clubs or organizations: Not on file    Relationship status: Not on file  . Intimate partner violence:    Fear of current or ex partner: Not on file    Emotionally abused: Not on file    Physically abused: Not on file    Forced sexual activity: Not on file  Other Topics Concern  . Not on file  Social History Narrative  . Not on file    Family History  Problem Relation Age of Onset  . Cerebral aneurysm Mother        died at age 31  . Healthy Father        died of old age    Review of Systems:  As stated in the HPI and otherwise negative.   BP (!) 112/56   Pulse 78   Ht 5' 5" (1.651 m)   Wt 158 lb (71.7 kg)   SpO2 98%   BMI 26.29 kg/m   Physical Examination: General: Well developed, well nourished, NAD  HEENT: OP clear, mucus membranes moist  SKIN: warm, dry. No rashes. Neuro: No focal deficits  Musculoskeletal: Muscle strength 5/5 all ext  Psychiatric: Mood and affect normal  Neck: No JVD, no carotid bruits, no thyromegaly, no lymphadenopathy.  Lungs:Clear bilaterally, no wheezes, rhonci, crackles Cardiovascular: Regular rate and rhythm. No murmurs, gallops or rubs. Abdomen:Soft. Bowel sounds present. Non-tender.  Extremities: No lower extremity edema. Pulses are 2 + in the bilateral DP/PT.  Echo may 2019 - Left ventricle: The cavity size was normal. There was moderate   concentric hypertrophy. Systolic function was moderately to   severely reduced. The estimated ejection fraction was in the   range of 30% to 35%. There was dynamic obstruction, with a peak   velocity of 390 cm/sec and a peak gradient of 61 mm Hg. Akinesis   of the basal-midanteroseptal, inferior, and inferoseptal   myocardium. Left ventricular diastolic function parameters were   normal. Doppler parameters are consistent with indeterminate    ventricular filling pressure. - Aortic valve: There was mild stenosis. Peak velocity (S): 238   cm/s. Mean gradient (S): 12 mm Hg. - Mitral valve: Transvalvular velocity was within the normal range.   There was no evidence for stenosis. There was mild regurgitation. - Left atrium: The atrium was moderately dilated. - Right ventricle: The cavity size was normal. Wall thickness was   normal. Systolic function was normal. - Tricuspid valve: There was trivial regurgitation.  EKG:  EKG  is not ordered today. The ekg ordered today demonstrates   Recent Labs: No results found for requested labs within last 8760 hours.   Lipid Panel    Component Value Date/Time   CHOL 145 06/14/2016 0826   TRIG 77 06/14/2016 0826   HDL 60 06/14/2016 0826   CHOLHDL 2.4 06/14/2016 0826   CHOLHDL 2.2 01/04/2016 1058   VLDL 24 01/04/2016 1058   LDLCALC 70 06/14/2016 0826     Wt Readings from Last 3 Encounters:  07/08/17 158 lb (71.7 kg)  05/08/17 155 lb (70.3 kg)  12/03/16 159 lb 6.4 oz (72.3 kg)     Other studies Reviewed: Additional studies/ records that were reviewed today include: . Review of the above records demonstrates:    Assessment and Plan:   1. CAD without angina: No chest pain. Will continue Plavix, statin and beta blocker.     2. Ischemic cardiomyopathy: LVEF 30-35% by echo May 2019. I will continue the beta blocker, ARB and aldactone. I will check a BMET today.   3. Chronic systolic CHF: Volume status is ok. Continue Lasix.   4. LBBB: Chronic  5. Mitral regurgitation: Mild by echo in May 2019.   6. Aortic stenosis: She had moderate AS by echo in May 2019 but likely low gradient/low output severe AS due to LV systolic dysfunction. She has asymptomatic.   7. Hyperlipidemia: Continue statin. Check lipids and LFTs today.   8. LVH: Moderate LVH on echo May 2019.   Current medicines are reviewed at length with the patient today.  The patient does not have concerns regarding  medicines.  The following changes have been made:  no change  Labs/ tests ordered today include:   Orders Placed This Encounter  Procedures  . Comp Met (CMET)  . Lipid Profile    Disposition:   FU with me in 6 months  Signed, Lauree Chandler, MD 07/08/2017 3:14 PM    [facility] [place], [place], [place]  [address] Phone: [phone]; Fax: [phone]

## 2017-07-09 LAB — LIPID PANEL
CHOL/HDL RATIO: 2.6 ratio (ref 0.0–4.4)
Cholesterol, Total: 148 mg/dL (ref 100–199)
HDL: 56 mg/dL (ref >39)
LDL CALC: 76 mg/dL (ref 0–99)
TRIGLYCERIDES: 81 mg/dL (ref 0–149)
VLDL Cholesterol Cal: 16 mg/dL (ref 5–40)

## 2017-07-09 LAB — COMPREHENSIVE METABOLIC PANEL
A/G RATIO: 2 (ref 1.2–2.2)
ALBUMIN: 4.3 g/dL (ref 3.5–4.7)
ALT: 13 IU/L (ref 0–32)
AST: 19 IU/L (ref 0–40)
Alkaline Phosphatase: 90 IU/L (ref 39–117)
BUN / CREAT RATIO: 28 (ref 12–28)
BUN: 30 mg/dL — ABNORMAL HIGH (ref 8–27)
Bilirubin Total: 0.6 mg/dL (ref 0.0–1.2)
CALCIUM: 9.4 mg/dL (ref 8.7–10.3)
CO2: 22 mmol/L (ref 20–29)
CREATININE: 1.06 mg/dL — AB (ref 0.57–1.00)
Chloride: 103 mmol/L (ref 96–106)
GFR calc Af Amer: 54 mL/min/{1.73_m2} — ABNORMAL LOW (ref >59)
GFR, EST NON AFRICAN AMERICAN: 47 mL/min/{1.73_m2} — AB (ref >59)
GLOBULIN, TOTAL: 2.1 g/dL (ref 1.5–4.5)
Glucose: 91 mg/dL (ref 65–99)
POTASSIUM: 4.5 mmol/L (ref 3.5–5.2)
SODIUM: 140 mmol/L (ref 134–144)
Total Protein: 6.4 g/dL (ref 6.0–8.5)

## 2017-08-14 ENCOUNTER — Ambulatory Visit (INDEPENDENT_AMBULATORY_CARE_PROVIDER_SITE_OTHER): Payer: Medicare Other | Admitting: Orthopaedic Surgery

## 2017-08-14 ENCOUNTER — Encounter (INDEPENDENT_AMBULATORY_CARE_PROVIDER_SITE_OTHER): Payer: Self-pay | Admitting: Orthopaedic Surgery

## 2017-08-14 DIAGNOSIS — M25512 Pain in left shoulder: Secondary | ICD-10-CM

## 2017-08-14 DIAGNOSIS — M25511 Pain in right shoulder: Secondary | ICD-10-CM

## 2017-08-14 DIAGNOSIS — G8929 Other chronic pain: Secondary | ICD-10-CM | POA: Diagnosis not present

## 2017-08-14 MED ORDER — LIDOCAINE HCL 1 % IJ SOLN
3.0000 mL | INTRAMUSCULAR | Status: AC | PRN
  Administered 2017-08-14: 3 mL

## 2017-08-14 MED ORDER — METHYLPREDNISOLONE ACETATE 40 MG/ML IJ SUSP
40.0000 mg | INTRAMUSCULAR | Status: AC | PRN
  Administered 2017-08-14: 40 mg via INTRA_ARTICULAR

## 2017-08-14 NOTE — Progress Notes (Signed)
Office Visit Note   Patient: Beth Johnston           Date of Birth: 06/13/1925           MRN: [id number] Visit Date: 08/14/2017              Requested by: No referring provider defined for this encounter. PCP: Patient, No Pcp Per   Assessment & Plan: Visit Diagnoses:  1. Chronic pain of both shoulders     Plan: I agree with trying steroid injections again in her shoulders today.  She understands fully the risk and benefits of these injections having had them before.  She tolerated them well.  Follow-up will be as needed.  All question concerns were answered and addressed.  Follow-Up Instructions: Return if symptoms worsen or fail to improve.   Orders:  No orders of the defined types were placed in this encounter.  No orders of the defined types were placed in this encounter.     Procedures: Large Joint Inj: R subacromial bursa on 08/14/2017 3:01 PM Indications: pain and diagnostic evaluation Details: 22 G 1.5 in needle  Arthrogram: No  Medications: 3 mL lidocaine 1 %; 40 mg methylPREDNISolone acetate 40 MG/ML Outcome: tolerated well, no immediate complications Procedure, treatment alternatives, risks and benefits explained, specific risks discussed. Consent was given by the patient. Immediately prior to procedure a time out was called to verify the correct patient, procedure, equipment, support staff and site/side marked as required. Patient was prepped and draped in the usual sterile fashion.   Large Joint Inj: L subacromial bursa on 08/14/2017 3:01 PM Indications: pain and diagnostic evaluation Details: 22 G 1.5 in needle  Arthrogram: No  Medications: 3 mL lidocaine 1 %; 40 mg methylPREDNISolone acetate 40 MG/ML Outcome: tolerated well, no immediate complications Procedure, treatment alternatives, risks and benefits explained, specific risks discussed. Consent was given by the patient. Immediately prior to procedure a time out was called to verify the correct  patient, procedure, equipment, support staff and site/side marked as required. Patient was prepped and draped in the usual sterile fashion.       Clinical Data: No additional findings.   Subjective: Chief Complaint  Patient presents with  . Right Shoulder - Pain    S/p cortisone injection 05/16/17  . Left Shoulder - Pain    S/p cortisone injection 05/16/17  The patient is well-known to me.  She has chronic shoulder issues on both her shoulders.  She is 84 years old.  She has had injections in the past and would like to have some today.  She is had an acute exacerbation of chronic issue with her shoulders.  Nothing else to change her medical status.  She is going on a cruise later this year.  HPI  Review of Systems She currently denies any headache, chest pain, shortness of breath, fever, chills, nausea, vomiting.  Objective: Vital Signs: There were no vitals taken for this visit.  Physical Exam She is alert and oriented x3 and in no acute distress Ortho Exam Examination of both her shoulder shows fluid range of motion that are painful.  There is a component of this there is glenohumeral component that is subacromial in terms of her pain. Specialty Comments:  No specialty comments available.  Imaging: No results found.   PMFS History: Patient Active Problem List   Diagnosis Date Noted  . Chronic pain of both shoulders 02/27/2016  . Acute heart failure ([place])   . Elevated troponin   .  Stented coronary artery   . Acute respiratory failure ([place]) 01/02/2014  . CAD S/P high risk LAD PCI/DES 12/30/13 01/02/2014  . Pseudoaneurysm following PCI 01/02/2014  . Blood loss anemia 01/02/2014  . Acute systolic CHF (congestive heart failure) ([place])   . NSTEMI (non-ST elevated myocardial infarction) ([place]) 12/26/2013  . Mitral regurgitation 12/26/2013  . Cardiomyopathy, ischemic-EF 15%-pre PCI 12/26/2013  . LBBB (left bundle branch block) 12/25/2013  . Asthma, persistent controlled  05/28/2013  . Upper airway cough syndrome 05/28/2013  . Cough 05/11/2013   Past Medical History:  Diagnosis Date  . Arthritis    "mostly my hands" (12/25/2013)  . Asthma   . Ischemic cardiomyopathy    a. EF 15%  . PONV (postoperative nausea and vomiting)   . Upper airway cough syndrome    Post-nasal drip    Family History  Problem Relation Age of Onset  . Cerebral aneurysm Mother        died at age 66  . Healthy Father        died of old age    Past Surgical History:  Procedure Laterality Date  . APPENDECTOMY  1960's  . CARDIAC CATHETERIZATION  12/28/2013   Procedure: RIGHT/LEFT HEART CATH AND CORONARY ANGIOGRAPHY;  Surgeon: Troy Sine, MD;  Location: [facility] CATH LAB;  Service: Cardiovascular;;  . CYSTOCELE REPAIR  12/18/2011   Procedure: ANTERIOR REPAIR (CYSTOCELE);  Surgeon: Gus Height, MD;  Location: [facility];  Service: Gynecology;  Laterality: N/A;  . JOINT REPLACEMENT    . JOINT REPLACEMENT Right 1980's   "ring finger"  . PERCUTANEOUS CORONARY STENT INTERVENTION (PCI-S) N/A 12/30/2013   Procedure: PERCUTANEOUS CORONARY STENT INTERVENTION (PCI-S);  Surgeon: Burnell Blanks, MD;  Location: [facility] CATH LAB;  Service: Cardiovascular;  Laterality: N/A;  . REPLACEMENT TOTAL KNEE BILATERAL Bilateral 2009   Social History   Occupational History  . Not on file  Tobacco Use  . Smoking status: Never Smoker  . Smokeless tobacco: Never Used  Substance and Sexual Activity  . Alcohol use: No  . Drug use: No  . Sexual activity: Not on file

## 2017-09-21 ENCOUNTER — Other Ambulatory Visit (INDEPENDENT_AMBULATORY_CARE_PROVIDER_SITE_OTHER): Payer: Self-pay | Admitting: Orthopaedic Surgery

## 2017-10-19 ENCOUNTER — Other Ambulatory Visit: Payer: Self-pay | Admitting: Cardiovascular Disease

## 2017-10-23 DIAGNOSIS — L570 Actinic keratosis: Secondary | ICD-10-CM | POA: Diagnosis not present

## 2017-10-23 DIAGNOSIS — Z85828 Personal history of other malignant neoplasm of skin: Secondary | ICD-10-CM | POA: Diagnosis not present

## 2017-10-23 DIAGNOSIS — Z48817 Encounter for surgical aftercare following surgery on the skin and subcutaneous tissue: Secondary | ICD-10-CM | POA: Diagnosis not present

## 2017-10-27 ENCOUNTER — Other Ambulatory Visit: Payer: Self-pay | Admitting: Cardiovascular Disease

## 2017-11-25 ENCOUNTER — Encounter (INDEPENDENT_AMBULATORY_CARE_PROVIDER_SITE_OTHER): Payer: Self-pay | Admitting: Orthopaedic Surgery

## 2017-11-25 ENCOUNTER — Ambulatory Visit (INDEPENDENT_AMBULATORY_CARE_PROVIDER_SITE_OTHER): Payer: Medicare Other | Admitting: Orthopaedic Surgery

## 2017-11-25 DIAGNOSIS — M25511 Pain in right shoulder: Secondary | ICD-10-CM | POA: Diagnosis not present

## 2017-11-25 DIAGNOSIS — G8929 Other chronic pain: Secondary | ICD-10-CM | POA: Diagnosis not present

## 2017-11-25 DIAGNOSIS — M25512 Pain in left shoulder: Secondary | ICD-10-CM

## 2017-11-25 DIAGNOSIS — I255 Ischemic cardiomyopathy: Secondary | ICD-10-CM | POA: Diagnosis not present

## 2017-11-25 MED ORDER — LIDOCAINE HCL 1 % IJ SOLN
3.0000 mL | INTRAMUSCULAR | Status: AC | PRN
  Administered 2017-11-25: 3 mL

## 2017-11-25 MED ORDER — METHYLPREDNISOLONE ACETATE 40 MG/ML IJ SUSP
40.0000 mg | INTRAMUSCULAR | Status: AC | PRN
  Administered 2017-11-25: 40 mg via INTRA_ARTICULAR

## 2017-11-25 NOTE — Progress Notes (Signed)
Office Visit Note   Patient: Beth Johnston           Date of Birth: July 21, 1925           MRN: [id number] Visit Date: 11/25/2017              Requested by: No referring provider defined for this encounter. PCP: Patient, No Pcp Per   Assessment & Plan: Visit Diagnoses:  1. Chronic pain of both shoulders     Plan: Per her wishes I did provide a steroid injection in the subacromial space of both shoulders which he tolerated well.  She is fully aware of the risk and benefits of these types of injections.  All question concerns were answered addressed.  Follow-up will be as needed.  Follow-Up Instructions: Return if symptoms worsen or fail to improve.   Orders:  Orders Placed This Encounter  Procedures  . Large Joint Inj  . Large Joint Inj   No orders of the defined types were placed in this encounter.     Procedures: Large Joint Inj: R subacromial bursa on 11/25/2017 2:22 PM Indications: pain and diagnostic evaluation Details: 22 G 1.5 in needle  Arthrogram: No  Medications: 3 mL lidocaine 1 %; 40 mg methylPREDNISolone acetate 40 MG/ML Outcome: tolerated well, no immediate complications Procedure, treatment alternatives, risks and benefits explained, specific risks discussed. Consent was given by the patient. Immediately prior to procedure a time out was called to verify the correct patient, procedure, equipment, support staff and site/side marked as required. Patient was prepped and draped in the usual sterile fashion.   Large Joint Inj: L subacromial bursa on 11/25/2017 2:23 PM Indications: pain and diagnostic evaluation Details: 22 G 1.5 in needle  Arthrogram: No  Medications: 3 mL lidocaine 1 %; 40 mg methylPREDNISolone acetate 40 MG/ML Outcome: tolerated well, no immediate complications Procedure, treatment alternatives, risks and benefits explained, specific risks discussed. Consent was given by the patient. Immediately prior to procedure a time out was called  to verify the correct patient, procedure, equipment, support staff and site/side marked as required. Patient was prepped and draped in the usual sterile fashion.       Clinical Data: No additional findings.   Subjective: No chief complaint on file. The patient is well-known to me.  She is now 84 year old female and is chronic pain in both her shoulders.  We last injection these shoulders about 7 months ago.  She has problems with overhead activities and with activities of daily living such as washing her hair.  Her left shoulder is just been too painful and weak for to do those things.  She is going on a cruise in 2 weeks would like to have injections in both her shoulders today.  There has been no other change in her medical status.  HPI  Review of Systems She currently denies any headache, chest pain, shortness of breath, fever, chills, nausea, vomiting.  Objective: Vital Signs: There were no vitals taken for this visit.  Physical Exam She is alert and oriented x3 and in no acute distress Ortho Exam Examination of both her shoulder shows limited abduction and external rotation with weakness and obvious deficiency of both rotator cuffs. Specialty Comments:  No specialty comments available.  Imaging: No results found.   PMFS History: Patient Active Problem List   Diagnosis Date Noted  . Chronic pain of both shoulders 02/27/2016  . Acute heart failure ([place])   . Elevated troponin   . Stented coronary  artery   . Acute respiratory failure ([place]) 01/02/2014  . CAD S/P high risk LAD PCI/DES 12/30/13 01/02/2014  . Pseudoaneurysm following PCI 01/02/2014  . Blood loss anemia 01/02/2014  . Acute systolic CHF (congestive heart failure) ([place])   . NSTEMI (non-ST elevated myocardial infarction) ([place]) 12/26/2013  . Mitral regurgitation 12/26/2013  . Cardiomyopathy, ischemic-EF 15%-pre PCI 12/26/2013  . LBBB (left bundle branch block) 12/25/2013  . Asthma, persistent controlled  05/28/2013  . Upper airway cough syndrome 05/28/2013  . Cough 05/11/2013   Past Medical History:  Diagnosis Date  . Arthritis    "mostly my hands" (12/25/2013)  . Asthma   . Ischemic cardiomyopathy    a. EF 15%  . PONV (postoperative nausea and vomiting)   . Upper airway cough syndrome    Post-nasal drip    Family History  Problem Relation Age of Onset  . Cerebral aneurysm Mother        died at age 52  . Healthy Father        died of old age    Past Surgical History:  Procedure Laterality Date  . APPENDECTOMY  1960's  . CARDIAC CATHETERIZATION  12/28/2013   Procedure: RIGHT/LEFT HEART CATH AND CORONARY ANGIOGRAPHY;  Surgeon: Troy Sine, MD;  Location: [facility] CATH LAB;  Service: Cardiovascular;;  . CYSTOCELE REPAIR  12/18/2011   Procedure: ANTERIOR REPAIR (CYSTOCELE);  Surgeon: Gus Height, MD;  Location: [facility];  Service: Gynecology;  Laterality: N/A;  . JOINT REPLACEMENT    . JOINT REPLACEMENT Right 1980's   "ring finger"  . PERCUTANEOUS CORONARY STENT INTERVENTION (PCI-S) N/A 12/30/2013   Procedure: PERCUTANEOUS CORONARY STENT INTERVENTION (PCI-S);  Surgeon: Burnell Blanks, MD;  Location: [facility] CATH LAB;  Service: Cardiovascular;  Laterality: N/A;  . REPLACEMENT TOTAL KNEE BILATERAL Bilateral 2009   Social History   Occupational History  . Not on file  Tobacco Use  . Smoking status: Never Smoker  . Smokeless tobacco: Never Used  Substance and Sexual Activity  . Alcohol use: No  . Drug use: No  . Sexual activity: Not on file

## 2017-12-17 ENCOUNTER — Other Ambulatory Visit (INDEPENDENT_AMBULATORY_CARE_PROVIDER_SITE_OTHER): Payer: Self-pay | Admitting: Orthopaedic Surgery

## 2017-12-31 DIAGNOSIS — J209 Acute bronchitis, unspecified: Secondary | ICD-10-CM | POA: Diagnosis not present

## 2018-01-13 ENCOUNTER — Encounter: Payer: Self-pay | Admitting: Cardiovascular Disease

## 2018-01-13 ENCOUNTER — Ambulatory Visit (INDEPENDENT_AMBULATORY_CARE_PROVIDER_SITE_OTHER): Payer: Medicare Other | Admitting: Cardiovascular Disease

## 2018-01-13 VITALS — BP 114/60 | HR 77 | Ht 65.0 in | Wt 162.8 lb

## 2018-01-13 DIAGNOSIS — I2510 Atherosclerotic heart disease of native coronary artery without angina pectoris: Secondary | ICD-10-CM | POA: Diagnosis not present

## 2018-01-13 DIAGNOSIS — I350 Nonrheumatic aortic (valve) stenosis: Secondary | ICD-10-CM

## 2018-01-13 DIAGNOSIS — I340 Nonrheumatic mitral (valve) insufficiency: Secondary | ICD-10-CM | POA: Diagnosis not present

## 2018-01-13 DIAGNOSIS — I255 Ischemic cardiomyopathy: Secondary | ICD-10-CM | POA: Diagnosis not present

## 2018-01-13 DIAGNOSIS — I5022 Chronic systolic (congestive) heart failure: Secondary | ICD-10-CM

## 2018-01-13 DIAGNOSIS — I447 Left bundle-branch block, unspecified: Secondary | ICD-10-CM

## 2018-01-13 NOTE — Patient Instructions (Signed)
Medication Instructions:  Your physician recommends that you continue on your current medications as directed. Please refer to the Current Medication list given to you today. Stop taking losartan for 2 weeks to see if this helps with cough  If you need a refill on your cardiac medications before your next appointment, please call your pharmacy.   Lab work: none If you have labs (blood work) drawn today and your tests are completely normal, you will receive your results only by: Marland Kitchen MyChart Message (if you have MyChart) OR . A paper copy in the mail If you have any lab test that is abnormal or we need to change your treatment, we will call you to review the results.  Testing/Procedures: none  Follow-Up: At [facility], you and your health needs are our priority.  As part of our continuing mission to provide you with exceptional heart care, we have created designated Provider Care Teams.  These Care Teams include your primary Cardiologist (physician) and Advanced Practice Providers (APPs -  Physician Assistants and Nurse Practitioners) who all work together to provide you with the care you need, when you need it. You will need a follow up appointment in 6 months.  Please call our office 2 months in advance to schedule this appointment.  You may see Lauree Chandler, MD or one of the following Advanced Practice Providers on your designated Care Team:   [place], PA-C Melina Copa, PA-C . Ermalinda Barrios, PA-C  Any Other Special Instructions Will Be Listed Below (If Applicable).

## 2018-01-13 NOTE — Progress Notes (Signed)
Chief Complaint  Patient presents with  . Follow-up    CAD   History of Present Illness: 84 yo female with history of CAD, ischemic cardiomyopathy, LBBB, mitral regurgitation and aortic stenosis who is here today for cardiac follow up. She was admitted to [facility] in November of 2015 with a NSTEMI and was found to have severe calcific disease in the LAD by cath 12/28/13. LVEF was 10%. She refused CABG and underwent high risk PCI/rotablator atherectomy of the LAD with Impella LV support. 2 drug eluting stents were placed in the LAD. Echo May 2019 with LVEF=30-35%, mild to moderate AS.   She is here today for follow up. The patient denies any chest pain, dyspnea, palpitations, lower extremity edema, orthopnea, PND, dizziness, near syncope or syncope. She has a chronic cough. She coughs every ten minutes, every day for years. Her cough is non-productive.    Primary care provider:  She goes to urgent care if she needs a doctor  Past Medical History:  Diagnosis Date  . Arthritis    "mostly my hands" (12/25/2013)  . Asthma   . Ischemic cardiomyopathy    a. EF 15%  . PONV (postoperative nausea and vomiting)   . Upper airway cough syndrome    Post-nasal drip    Past Surgical History:  Procedure Laterality Date  . APPENDECTOMY  1960's  . CARDIAC CATHETERIZATION  12/28/2013   Procedure: RIGHT/LEFT HEART CATH AND CORONARY ANGIOGRAPHY;  Surgeon: Troy Sine, MD;  Location: [facility] CATH LAB;  Service: Cardiovascular;;  . CYSTOCELE REPAIR  12/18/2011   Procedure: ANTERIOR REPAIR (CYSTOCELE);  Surgeon: Gus Height, MD;  Location: [facility];  Service: Gynecology;  Laterality: N/A;  . JOINT REPLACEMENT    . JOINT REPLACEMENT Right 1980's   "ring finger"  . PERCUTANEOUS CORONARY STENT INTERVENTION (PCI-S) N/A 12/30/2013   Procedure: PERCUTANEOUS CORONARY STENT INTERVENTION (PCI-S);  Surgeon: Burnell Blanks, MD;  Location: [facility] C[facility] LAB;  Service: Cardiovascular;  Laterality: N/A;  . REPLACEMENT  TOTAL KNEE BILATERAL Bilateral 2009    Current Outpatient Medications  Medication Sig Dispense Refill  . acetaminophen (TYLENOL) 325 MG tablet Take 2 tablets (650 mg total) by mouth every 6 (six) hours as needed. 30 tablet 0  . amoxicillin (AMOXIL) 500 MG capsule Take 4 capsules by mouth as needed. (Take 1 hour prior for dental procedures)  0  . atorvastatin (LIPITOR) 40 MG tablet Take 0.5 tablets (20 mg total) by mouth daily at 6 PM. 45 tablet 2  . carvedilol (COREG) 6.25 MG tablet TAKE 1 TABLET BY MOUTH TWICE DAILY WITH A MEAL 180 tablet 2  . clopidogrel (PLAVIX) 75 MG tablet Take 1 tablet (75 mg total) by mouth daily with breakfast. 90 tablet 2  . furosemide (LASIX) 40 MG tablet TAKE 1 TABLET (40 MG TOTAL) BY MOUTH DAILY. 90 tablet 2  . losartan (COZAAR) 25 MG tablet Take 1 tablet (25 mg total) by mouth daily. 90 tablet 3  . meloxicam (MOBIC) 7.5 MG tablet TAKE 1 TABLET (7.5 MG TOTAL) BY MOUTH 2 (TWO) TIMES DAILY. 180 tablet 0  . spironolactone (ALDACTONE) 25 MG tablet TAKE 1/2 (ONE-HALF) TABLET BY MOUTH TWICE DAILY AT  8AM  AND  6PM 90 tablet 3   No current facility-administered medications for this visit.     Allergies  Allergen Reactions  . Lisinopril Cough    Social History   Socioeconomic History  . Marital status: Widowed    Spouse name: Not on file  .  Number of children: Not on file  . Years of education: Not on file  . Highest education level: Not on file  Occupational History  . Not on file  Social Needs  . Financial resource strain: Not on file  . Food insecurity:    Worry: Not on file    Inability: Not on file  . Transportation needs:    Medical: Not on file    Non-medical: Not on file  Tobacco Use  . Smoking status: Never Smoker  . Smokeless tobacco: Never Used  Substance and Sexual Activity  . Alcohol use: No  . Drug use: No  . Sexual activity: Not on file  Lifestyle  . Physical activity:    Days per week: Not on file    Minutes per session: Not on  file  . Stress: Not on file  Relationships  . Social connections:    Talks on phone: Not on file    Gets together: Not on file    Attends religious service: Not on file    Active member of club or organization: Not on file    Attends meetings of clubs or organizations: Not on file    Relationship status: Not on file  . Intimate partner violence:    Fear of current or ex partner: Not on file    Emotionally abused: Not on file    Physically abused: Not on file    Forced sexual activity: Not on file  Other Topics Concern  . Not on file  Social History Narrative  . Not on file    Family History  Problem Relation Age of Onset  . Cerebral aneurysm Mother        died at age 15  . Healthy Father        died of old age    Review of Systems:  As stated in the HPI and otherwise negative.   BP 114/60   Pulse 77   Ht 5\' 5"  (1.651 m)   Wt 162 lb 12.8 oz (73.8 kg)   SpO2 97%   BMI 27.09 kg/m   Physical Examination:  General: Well developed, well nourished, NAD  HEENT: OP clear, mucus membranes moist  SKIN: warm, dry. No rashes. Neuro: No focal deficits  Musculoskeletal: Muscle strength 5/5 all ext  Psychiatric: Mood and affect normal  Neck: No JVD, no carotid bruits, no thyromegaly, no lymphadenopathy.  Lungs:Clear bilaterally, no wheezes, rhonci, crackles Cardiovascular: Regular rate and rhythm. Systolic murmur at RUSB.  Abdomen:Soft. Bowel sounds present. Non-tender.  Extremities: No lower extremity edema. Pulses are 2 + in the bilateral DP/PT.  Echo may 2019 - Left ventricle: The cavity size was normal. There was moderate   concentric hypertrophy. Systolic function was moderately to   severely reduced. The estimated ejection fraction was in the   range of 30% to 35%. There was dynamic obstruction, with a peak   velocity of 390 cm/sec and a peak gradient of 61 mm Hg. Akinesis   of the basal-midanteroseptal, inferior, and inferoseptal   myocardium. Left ventricular  diastolic function parameters were   normal. Doppler parameters are consistent with indeterminate   ventricular filling pressure. - Aortic valve: There was mild stenosis. Peak velocity (S): 238   cm/s. Mean gradient (S): 12 mm Hg. - Mitral valve: Transvalvular velocity was within the normal range.   There was no evidence for stenosis. There was mild regurgitation. - Left atrium: The atrium was moderately dilated. - Right ventricle: The cavity size was  normal. Wall thickness was   normal. Systolic function was normal. - Tricuspid valve: There was trivial regurgitation.  EKG:  EKG is ordered today. The ekg ordered today demonstrates NSR, rate 77 bpm. LBBB  Recent Labs: 07/08/2017: ALT 13; BUN 30; Creatinine, Ser 1.06; Potassium 4.5; Sodium 140   Lipid Panel    Component Value Date/Time   CHOL 148 07/08/2017 1437   TRIG 81 07/08/2017 1437   HDL 56 07/08/2017 1437   CHOLHDL 2.6 07/08/2017 1437   CHOLHDL 2.2 01/04/2016 1058   VLDL 24 01/04/2016 1058   LDLCALC 76 07/08/2017 1437     Wt Readings from Last 3 Encounters:  01/13/18 162 lb 12.8 oz (73.8 kg)  07/08/17 158 lb (71.7 kg)  05/08/17 155 lb (70.3 kg)     Other studies Reviewed: Additional studies/ records that were reviewed today include: . Review of the above records demonstrates:    Assessment and Plan:   1. CAD without angina: She has no chest pain. Will continue Plavix, beta blocker and statin.      2. Ischemic cardiomyopathy: LVEF 30-35% by echo May 2019. Continue beta blocker, ARB and aldactone.    3. Chronic systolic CHF: Weight is stable. Volume status is ok. Continue Lasx  4. LBBB: Chronic  5. Mitral regurgitation: Mild by echo in May 2019.   6. Aortic stenosis: She had moderate AS by echo in May 2019 but likely low gradient/low output severe AS due to LV systolic dysfunction. She remains asymptomatic.   7. Hyperlipidemia: LDL near goal. Continue statin.   8. LVH: Moderate LVH on echo May 2019.   9.  Cough: She has a constant cough for years. She has nasal drainage constantly. I think her cough is from post nasal drip. She is on Cozaar. I will have her hold her Cozaar for 2 weeks to see if her cough improves.   Current medicines are reviewed at length with the patient today.  The patient does not have concerns regarding medicines.  The following changes have been made:  no change  Labs/ tests ordered today include:   Orders Placed This Encounter  Procedures  . EKG 12-Lead    Disposition:   FU with me in 6 months  Signed, Lauree Chandler, MD 01/13/2018 3:16 PM    [facility] [place], [place], [place]  [address] Phone: [phone]; Fax: [phone]

## 2018-02-09 ENCOUNTER — Other Ambulatory Visit: Payer: Self-pay | Admitting: Cardiovascular Disease

## 2018-03-22 ENCOUNTER — Other Ambulatory Visit: Payer: Self-pay | Admitting: Cardiovascular Disease

## 2018-03-24 ENCOUNTER — Ambulatory Visit (INDEPENDENT_AMBULATORY_CARE_PROVIDER_SITE_OTHER): Payer: Medicare Other | Admitting: Orthopaedic Surgery

## 2018-03-24 ENCOUNTER — Ambulatory Visit (INDEPENDENT_AMBULATORY_CARE_PROVIDER_SITE_OTHER): Payer: Medicare Other

## 2018-03-24 ENCOUNTER — Encounter (INDEPENDENT_AMBULATORY_CARE_PROVIDER_SITE_OTHER): Payer: Self-pay | Admitting: Orthopaedic Surgery

## 2018-03-24 ENCOUNTER — Other Ambulatory Visit (INDEPENDENT_AMBULATORY_CARE_PROVIDER_SITE_OTHER): Payer: Self-pay

## 2018-03-24 DIAGNOSIS — M25552 Pain in left hip: Secondary | ICD-10-CM

## 2018-03-24 DIAGNOSIS — M1612 Unilateral primary osteoarthritis, left hip: Secondary | ICD-10-CM | POA: Diagnosis not present

## 2018-03-24 NOTE — Progress Notes (Signed)
Office Visit Note   Patient: Beth Johnston           Date of Birth: [birth year]           MRN: [id number] Visit Date: 03/24/2018              Requested by: No referring provider defined for this encounter. PCP: Patient, No Pcp Per   Assessment & Plan: Visit Diagnoses:  1. Pain in left hip   2. Unilateral primary osteoarthritis, left hip     Plan: She understands that she does have severe arthritis in her left hip.  She is not interested in any type of surgical intervention.  I do feel it is worth trying an intra-articular steroid injection under fluoroscopy.  We can set this up for Dr. Ernestina Patches.  She would like to have this done toward the end of March which will give her several weeks before her cruise and then when I see her back in early April I can inject both shoulders.  All question concerns were answered and addressed.  Follow-Up Instructions: Return in about 6 weeks (around 05/05/2018).   Orders:  Orders Placed This Encounter  Procedures  . XR HIP UNILAT W OR W/O PELVIS 1V LEFT   No orders of the defined types were placed in this encounter.     Procedures: No procedures performed   Clinical Data: No additional findings.   Subjective: Chief Complaint  Patient presents with  . Left Shoulder - Pain  . Right Shoulder - Pain  . Left Hip - Pain  The patient comes in today for evaluation treatment of left hip pain.  She does have bilateral shoulder pain and wants me to inject her shoulders before she goes on a cruise in mid April.  She does not want injections today.  She is a very active 85 year old female.  She has been having pain all around her left hip recently it is been worsening.  She does not ambulate using assistive device.  It does wake her up at night and hurts in the groin area on the left side.  It does hurt when she lays on the left side at night.  She denies any injuries that she is aware of.  HPI  Review of Systems She currently denies any headache,  chest pain, shortness of breath, fever, chills, nausea, vomiting  Objective: Vital Signs: There were no vitals taken for this visit.  Physical Exam She is alert and orient x3 and in no acute distress Ortho Exam Examination of her left hip shows severe pain all around the groin and the lateral hip with internal and external rotation of that hip.  Her right hip exam is normal. Specialty Comments:  No specialty comments available.  Imaging: Xr Hip Unilat W Or W/o Pelvis 1v Left  Result Date: 03/24/2018 An AP pelvis and a lateral left hip show severe end-stage arthritis of the left hip.  There is cystic changes in the femoral head and acetabulum as well as sclerotic changes.  There is significant loss of the joint space.  On the AP view the right hip appears normal.    PMFS History: Patient Active Problem List   Diagnosis Date Noted  . Unilateral primary osteoarthritis, left hip 03/24/2018  . Chronic pain of both shoulders 02/27/2016  . Acute heart failure ([place])   . Elevated troponin   . Stented coronary artery   . Acute respiratory failure ([place]) 01/02/2014  . CAD S/P high risk LAD  PCI/DES 12/30/13 01/02/2014  . Pseudoaneurysm following PCI 01/02/2014  . Blood loss anemia 01/02/2014  . Acute systolic CHF (congestive heart failure) ([place])   . NSTEMI (non-ST elevated myocardial infarction) ([place]) 12/26/2013  . Mitral regurgitation 12/26/2013  . Cardiomyopathy, ischemic-EF 15%-pre PCI 12/26/2013  . LBBB (left bundle branch block) 12/25/2013  . Asthma, persistent controlled 05/28/2013  . Upper airway cough syndrome 05/28/2013  . Cough 05/11/2013   Past Medical History:  Diagnosis Date  . Arthritis    "mostly my hands" (12/25/2013)  . Asthma   . Ischemic cardiomyopathy    a. EF 15%  . PONV (postoperative nausea and vomiting)   . Upper airway cough syndrome    Post-nasal drip    Family History  Problem Relation Age of Onset  . Cerebral aneurysm Mother        died at age 74    . Healthy Father        died of old age    Past Surgical History:  Procedure Laterality Date  . APPENDECTOMY  1960's  . CARDIAC CATHETERIZATION  12/28/2013   Procedure: RIGHT/LEFT HEART CATH AND CORONARY ANGIOGRAPHY;  Surgeon: Troy Sine, MD;  Location: [facility] CATH LAB;  Service: Cardiovascular;;  . CYSTOCELE REPAIR  12/18/2011   Procedure: ANTERIOR REPAIR (CYSTOCELE);  Surgeon: Gus Height, MD;  Location: [facility];  Service: Gynecology;  Laterality: N/A;  . JOINT REPLACEMENT    . JOINT REPLACEMENT Right 1980's   "ring finger"  . PERCUTANEOUS CORONARY STENT INTERVENTION (PCI-S) N/A 12/30/2013   Procedure: PERCUTANEOUS CORONARY STENT INTERVENTION (PCI-S);  Surgeon: Burnell Blanks, MD;  Location: [facility] C[facility] LAB;  Service: Cardiovascular;  Laterality: N/A;  . REPLACEMENT TOTAL KNEE BILATERAL Bilateral 2009   Social History   Occupational History  . Not on file  Tobacco Use  . Smoking status: Never Smoker  . Smokeless tobacco: Never Used  Substance and Sexual Activity  . Alcohol use: No  . Drug use: No  . Sexual activity: Not on file

## 2018-04-07 ENCOUNTER — Other Ambulatory Visit: Payer: Self-pay | Admitting: Cardiovascular Disease

## 2018-04-11 ENCOUNTER — Ambulatory Visit (INDEPENDENT_AMBULATORY_CARE_PROVIDER_SITE_OTHER): Payer: Self-pay

## 2018-04-11 ENCOUNTER — Ambulatory Visit (INDEPENDENT_AMBULATORY_CARE_PROVIDER_SITE_OTHER): Payer: Medicare Other | Admitting: Physical Medicine and Rehabilitation

## 2018-04-11 ENCOUNTER — Encounter (INDEPENDENT_AMBULATORY_CARE_PROVIDER_SITE_OTHER): Payer: Self-pay | Admitting: Physical Medicine and Rehabilitation

## 2018-04-11 ENCOUNTER — Other Ambulatory Visit: Payer: Self-pay

## 2018-04-11 DIAGNOSIS — M25552 Pain in left hip: Secondary | ICD-10-CM | POA: Diagnosis not present

## 2018-04-11 NOTE — Progress Notes (Signed)
 .  Numeric Pain Rating Scale and Functional Assessment Average Pain 10   In the last MONTH (on 0-10 scale) has pain interfered with the following?  1. General activity like being  able to carry out your everyday physical activities such as walking, climbing stairs, carrying groceries, or moving a chair?  Rating(8)   -Dye Allergies.  

## 2018-04-11 NOTE — Progress Notes (Signed)
Beth Johnston - 85 y.o. female MRN [id number]  Date of birth: [birth year]  Office Visit Note: Visit Date: 04/11/2018 PCP: Patient, No Pcp Per Referred by: Mcarthur Rossetti*  Subjective: Chief Complaint  Patient presents with  . Left Hip - Pain   HPI: Beth Johnston is a 85 y.o. female who comes in today At the request of Jean Rosenthal for diagnostic and therapeutic left intra-articular hip injection anesthetic arthrogram.  Patient is having left posterior lateral hip pain and decreased range of motion.  ROS Otherwise per HPI.  Assessment & Plan: Visit Diagnoses:  1. Pain in left hip     Plan: No additional findings.   Meds & Orders: No orders of the defined types were placed in this encounter.   Orders Placed This Encounter  Procedures  . Large Joint Inj: L hip joint  . XR C-ARM NO REPORT    Follow-up: Return if symptoms worsen or fail to improve, for Jean Rosenthal, M.D..   Procedures: Large Joint Inj: L hip joint on 04/11/2018 9:05 AM Indications: pain and diagnostic evaluation Details: 22 G needle, anterior approach  Arthrogram: Yes  Medications: 80 mg triamcinolone acetonide 40 MG/ML; 3 mL bupivacaine 0.5 % Outcome: tolerated well, no immediate complications  Arthrogram demonstrated excellent flow of contrast throughout the joint surface without extravasation or obvious defect.  The patient had relief of symptoms during the anesthetic phase of the injection.  Procedure, treatment alternatives, risks and benefits explained, specific risks discussed. Consent was given by the patient. Immediately prior to procedure a time out was called to verify the correct patient, procedure, equipment, support staff and site/side marked as required. Patient was prepped and draped in the usual sterile fashion.      No notes on file   Clinical History: 03/24/2018 plain film radiograph of the pelvis and hip, left An AP pelvis and a lateral left hip show  severe end-stage arthritis of the  left hip. There is cystic changes in the femoral head and acetabulum as  well as sclerotic changes. There is significant loss of the joint space.  On the AP view the right hip appears normal.   She reports that she has never smoked. She has never used smokeless tobacco. No results for input(s): HGBA1C, LABURIC in the last 8760 hours.  Objective:  VS:  HT:    WT:   BMI:     BP:   HR: bpm  TEMP: ( )  RESP:  Physical Exam  Ortho Exam Imaging: No results found.  Past Medical/Family/Surgical/Social History: Medications & Allergies reviewed per EMR, new medications updated. Patient Active Problem List   Diagnosis Date Noted  . Unilateral primary osteoarthritis, left hip 03/24/2018  . Chronic pain of both shoulders 02/27/2016  . Acute heart failure ([place])   . Elevated troponin   . Stented coronary artery   . Acute respiratory failure ([place]) 01/02/2014  . CAD S/P high risk LAD PCI/DES 12/30/13 01/02/2014  . Pseudoaneurysm following PCI 01/02/2014  . Blood loss anemia 01/02/2014  . Acute systolic CHF (congestive heart failure) ([place])   . NSTEMI (non-ST elevated myocardial infarction) ([place]) 12/26/2013  . Mitral regurgitation 12/26/2013  . Cardiomyopathy, ischemic-EF 15%-pre PCI 12/26/2013  . LBBB (left bundle branch block) 12/25/2013  . Asthma, persistent controlled 05/28/2013  . Upper airway cough syndrome 05/28/2013  . Cough 05/11/2013   Past Medical History:  Diagnosis Date  . Arthritis    "mostly my hands" (12/25/2013)  . Asthma   .  Ischemic cardiomyopathy    a. EF 15%  . PONV (postoperative nausea and vomiting)   . Upper airway cough syndrome    Post-nasal drip   Family History  Problem Relation Age of Onset  . Cerebral aneurysm Mother        died at age 81  . Healthy Father        died of old age   Past Surgical History:  Procedure Laterality Date  . APPENDECTOMY  1960's  . CARDIAC CATHETERIZATION  12/28/2013   Procedure:  RIGHT/LEFT HEART CATH AND CORONARY ANGIOGRAPHY;  Surgeon: Troy Sine, MD;  Location: [facility] CATH LAB;  Service: Cardiovascular;;  . CYSTOCELE REPAIR  12/18/2011   Procedure: ANTERIOR REPAIR (CYSTOCELE);  Surgeon: Gus Height, MD;  Location: [facility];  Service: Gynecology;  Laterality: N/A;  . JOINT REPLACEMENT    . JOINT REPLACEMENT Right 1980's   "ring finger"  . PERCUTANEOUS CORONARY STENT INTERVENTION (PCI-S) N/A 12/30/2013   Procedure: PERCUTANEOUS CORONARY STENT INTERVENTION (PCI-S);  Surgeon: Burnell Blanks, MD;  Location: [facility] CATH LAB;  Service: Cardiovascular;  Laterality: N/A;  . REPLACEMENT TOTAL KNEE BILATERAL Bilateral 2009   Social History   Occupational History  . Not on file  Tobacco Use  . Smoking status: Never Smoker  . Smokeless tobacco: Never Used  Substance and Sexual Activity  . Alcohol use: No  . Drug use: No  . Sexual activity: Not on file

## 2018-04-14 MED ORDER — BUPIVACAINE HCL 0.5 % IJ SOLN
3.0000 mL | INTRAMUSCULAR | Status: AC | PRN
  Administered 2018-04-11: 3 mL via INTRA_ARTICULAR

## 2018-04-14 MED ORDER — TRIAMCINOLONE ACETONIDE 40 MG/ML IJ SUSP
80.0000 mg | INTRAMUSCULAR | Status: AC | PRN
  Administered 2018-04-11: 80 mg via INTRA_ARTICULAR

## 2018-04-16 ENCOUNTER — Other Ambulatory Visit: Payer: Self-pay | Admitting: Cardiovascular Disease

## 2018-04-16 NOTE — Telephone Encounter (Signed)
Pharmacy is requesting another alternative for this pt. Please address. Thank you

## 2018-04-17 MED ORDER — TELMISARTAN 40 MG PO TABS: 40.0000 mg | ORAL_TABLET | Freq: Every day | ORAL | 1 refills | Status: DC

## 2018-04-17 NOTE — Telephone Encounter (Signed)
Can we check and see if her pharmacy has any Diovan or Losartan available? She has a cough with Lisinopril so not a good option for her. Thanks, chris

## 2018-04-17 NOTE — Telephone Encounter (Signed)
We can do Telmisartan 40 mg daily. Thanks, chris

## 2018-04-17 NOTE — Telephone Encounter (Signed)
Spoke with Pharmacist at [facility] in [place].  States all Losartan is on back order.  They do have Diovan 40 in stock but only a 60 day supply and she said this is not the equivalent dose for Losartan 25mg .  They also have Telmisartan 40mg  in stock.

## 2018-05-02 ENCOUNTER — Telehealth (INDEPENDENT_AMBULATORY_CARE_PROVIDER_SITE_OTHER): Payer: Self-pay

## 2018-05-02 NOTE — Telephone Encounter (Signed)
Called patient and asked the screening questions.  Do you have now or have you had in the past 7 days a fever and/or chills? NO  Do you have now or have you had in the past 7 days a cough? NO  Do you have now or have you had in the last 7 days nausea, vomiting or abdominal pain? NO  Have you been exposed to anyone who has tested positive for COVID-19? NO  Have you or anyone who lives with you traveled within the last month? NO 

## 2018-05-05 ENCOUNTER — Telehealth (INDEPENDENT_AMBULATORY_CARE_PROVIDER_SITE_OTHER): Payer: Self-pay | Admitting: Radiology

## 2018-05-05 ENCOUNTER — Ambulatory Visit (INDEPENDENT_AMBULATORY_CARE_PROVIDER_SITE_OTHER): Payer: Medicare Other | Admitting: Orthopaedic Surgery

## 2018-05-05 NOTE — Telephone Encounter (Signed)
Called and spoke with patients daughter, Al Corpus, she answered NO to all pre screening questions in regards to her mother.

## 2018-05-06 ENCOUNTER — Ambulatory Visit (INDEPENDENT_AMBULATORY_CARE_PROVIDER_SITE_OTHER): Payer: Medicare Other | Admitting: Orthopaedic Surgery

## 2018-05-06 ENCOUNTER — Other Ambulatory Visit: Payer: Self-pay

## 2018-05-06 ENCOUNTER — Encounter (INDEPENDENT_AMBULATORY_CARE_PROVIDER_SITE_OTHER): Payer: Self-pay | Admitting: Orthopaedic Surgery

## 2018-05-06 DIAGNOSIS — G8929 Other chronic pain: Secondary | ICD-10-CM | POA: Diagnosis not present

## 2018-05-06 DIAGNOSIS — M25512 Pain in left shoulder: Secondary | ICD-10-CM | POA: Diagnosis not present

## 2018-05-06 DIAGNOSIS — M25511 Pain in right shoulder: Secondary | ICD-10-CM

## 2018-05-06 MED ORDER — METHYLPREDNISOLONE ACETATE 40 MG/ML IJ SUSP
40.0000 mg | INTRAMUSCULAR | Status: AC | PRN
  Administered 2018-05-06: 09:00:00 40 mg via INTRA_ARTICULAR

## 2018-05-06 MED ORDER — LIDOCAINE HCL 1 % IJ SOLN
0.5000 mL | INTRAMUSCULAR | Status: AC | PRN
  Administered 2018-05-06: 09:00:00 .5 mL

## 2018-05-06 MED ORDER — LIDOCAINE HCL 1 % IJ SOLN
0.5000 mL | INTRAMUSCULAR | Status: AC | PRN
Start: 2018-05-06 — End: 2018-05-06
  Administered 2018-05-06: 09:00:00 .5 mL

## 2018-05-06 NOTE — Progress Notes (Signed)
   Procedure Note  Patient: Beth Johnston             Date of Birth: [birth year]           MRN: [id number]             Visit Date: 05/06/2018 HPI: Mrs. Gurney returns today requesting bilateral shoulder injections.  She has chronic shoulder pain.  She was last seen 11/25/2017 was given injections in both shoulders subacromially which she did well with.  She denies any recent injury to either arm.  Denies any fevers chills.  Procedures: Visit Diagnoses: Chronic pain of both shoulders  Large Joint Inj: bilateral subacromial bursa on 05/06/2018 8:30 AM Indications: pain Details: 22 G 1.5 in needle, lateral approach  Arthrogram: No  Medications (Right): 0.5 mL lidocaine 1 %; 40 mg methylPREDNISolone acetate 40 MG/ML Medications (Left): 0.5 mL lidocaine 1 %; 40 mg methylPREDNISolone acetate 40 MG/ML Outcome: tolerated well, no immediate complications Procedure, treatment alternatives, risks and benefits explained, specific risks discussed. Consent was given by the patient. Immediately prior to procedure a time out was called to verify the correct patient, procedure, equipment, support staff and site/side marked as required. Patient was prepped and draped in the usual sterile fashion.    Plan: She understands she needs to wait at least 3 months between injections.  She will follow-up on as-needed basis.  Questions encouraged and answered.

## 2018-05-22 ENCOUNTER — Telehealth (INDEPENDENT_AMBULATORY_CARE_PROVIDER_SITE_OTHER): Payer: Self-pay

## 2018-05-22 NOTE — Telephone Encounter (Signed)
appt made for next month

## 2018-05-22 NOTE — Telephone Encounter (Signed)
Daughter stated, per patient, that last injection that was given to patient was done differently.  Patient did not receive same relief as before.  When can she receive more shots? Daughter Al Corpus [phone]

## 2018-05-22 NOTE — Telephone Encounter (Signed)
Please advise 

## 2018-05-22 NOTE — Telephone Encounter (Signed)
I am not sure what was done different.  These were bilateral shoulder injections then yielded in the subacromial space at her last visit.  He does in the same way I do.  We still cannot inject her again until minimum another month.

## 2018-06-04 DIAGNOSIS — L821 Other seborrheic keratosis: Secondary | ICD-10-CM | POA: Diagnosis not present

## 2018-06-04 DIAGNOSIS — C44529 Squamous cell carcinoma of skin of other part of trunk: Secondary | ICD-10-CM | POA: Diagnosis not present

## 2018-06-04 DIAGNOSIS — D485 Neoplasm of uncertain behavior of skin: Secondary | ICD-10-CM | POA: Diagnosis not present

## 2018-06-09 ENCOUNTER — Encounter: Payer: Self-pay | Admitting: Orthopaedic Surgery

## 2018-06-09 ENCOUNTER — Ambulatory Visit (INDEPENDENT_AMBULATORY_CARE_PROVIDER_SITE_OTHER): Payer: Medicare Other | Admitting: Orthopaedic Surgery

## 2018-06-09 ENCOUNTER — Other Ambulatory Visit: Payer: Self-pay | Admitting: Orthopaedic Surgery

## 2018-06-09 ENCOUNTER — Other Ambulatory Visit: Payer: Self-pay

## 2018-06-09 ENCOUNTER — Other Ambulatory Visit: Payer: Self-pay | Admitting: Cardiovascular Disease

## 2018-06-09 DIAGNOSIS — G8929 Other chronic pain: Secondary | ICD-10-CM

## 2018-06-09 DIAGNOSIS — M25512 Pain in left shoulder: Secondary | ICD-10-CM | POA: Diagnosis not present

## 2018-06-09 DIAGNOSIS — M25511 Pain in right shoulder: Secondary | ICD-10-CM | POA: Diagnosis not present

## 2018-06-09 MED ORDER — ACETAMINOPHEN-CODEINE #3 300-30 MG PO TABS: 1.0000 | ORAL_TABLET | Freq: Three times a day (TID) | ORAL | 0 refills | Status: DC | PRN

## 2018-06-09 MED ORDER — ATORVASTATIN CALCIUM 40 MG PO TABS: ORAL_TABLET | ORAL | 2 refills | Status: DC

## 2018-06-09 MED ORDER — DICLOFENAC SODIUM 1 % TD GEL: 2.0000 g | Freq: Four times a day (QID) | TRANSDERMAL | 3 refills | Status: DC

## 2018-06-09 MED ORDER — FUROSEMIDE 40 MG PO TABS: 40.0000 mg | ORAL_TABLET | Freq: Every day | ORAL | 2 refills | Status: DC

## 2018-06-09 MED ORDER — MELOXICAM 7.5 MG PO TABS: 7.5000 mg | ORAL_TABLET | Freq: Two times a day (BID) | ORAL | 3 refills | Status: DC | PRN

## 2018-06-09 MED ORDER — SPIRONOLACTONE 25 MG PO TABS: ORAL_TABLET | ORAL | 2 refills | Status: DC

## 2018-06-09 MED ORDER — CARVEDILOL 6.25 MG PO TABS: ORAL_TABLET | ORAL | 2 refills | Status: DC

## 2018-06-09 NOTE — Progress Notes (Signed)
The patient is a 85 year old female with severe chronic pain involving both her shoulders.  Her shoulders basically have severe rotator cuff arthropathy with essentiallyleft in either shoulder.  They hurt on a daily basis.  We have injected her shoulders in the subacromial space with a steroid just a month ago.  She feels that those injections did not help and they were done in the wrong area of her shoulder.  They were put in just off the lateral edge of the acromion which is in the subacromial space.  I have placed steroid injections prior to that in the posterior lateral aspect of the shoulder.  The patient is frustrated with amount of pain she is having.  I family member with her is also equally frustrated and somewhat angered with the fact that I recommended against any steroid injections in her shoulders today given the detrimental effect with steroids this close together can have on the body including the potential for causing diabetes or adrenal insufficiency.  I did not feel comfortable from medical standpoint of placing steroids this needle in both her shoulders.  Also feel that it is gotten to the point where these are not going to help much given the severe disease in both of her shoulders.  At this point I recommended meloxicam twice a day and alternating this with Tylenol arthritis twice a day.  Also will send in some Tylenol No. 3 with codeine to see if this will help with her pain.  I would feel more comfortable with her considering injection as after another month.  I tried to convey this in an appropriate manner.

## 2018-06-11 ENCOUNTER — Other Ambulatory Visit: Payer: Self-pay | Admitting: Cardiovascular Disease

## 2018-06-11 MED ORDER — TELMISARTAN 40 MG PO TABS: 40.0000 mg | ORAL_TABLET | Freq: Every day | ORAL | 1 refills | Status: DC

## 2018-06-13 ENCOUNTER — Other Ambulatory Visit: Payer: Self-pay | Admitting: Cardiovascular Disease

## 2018-06-13 MED ORDER — CLOPIDOGREL BISULFATE 75 MG PO TABS: 75.0000 mg | ORAL_TABLET | Freq: Every day | ORAL | 1 refills | Status: DC

## 2018-06-16 ENCOUNTER — Telehealth: Payer: Self-pay | Admitting: Cardiovascular Disease

## 2018-06-16 MED ORDER — LOSARTAN POTASSIUM 25 MG PO TABS: 25.0000 mg | ORAL_TABLET | Freq: Every day | ORAL | 2 refills | Status: DC

## 2018-06-16 NOTE — Telephone Encounter (Signed)
I am ok changing Ms. Kady back to Losartan. Thanks, chris

## 2018-06-16 NOTE — Telephone Encounter (Signed)
Losartan was changed because [facility] (see refill note from March 18).  [facility] and they now have a limited supply of Losartan 25 mg. I spoke with pt's daughter who states since changing medications pt hasn't felt as good. Complains of upset stomach and dry mouth.  This medication change is the only thing new for pt. They do not check her BP at home.   I explained to pt's daughter the reason for change.  She states pt now gets prescriptions through mail order and she will check with pharmacy to see if available. Will forward to Dr. Angelena Form to see if pt can be changed back to losartan 25 mg daily if pt is able to locate it.

## 2018-06-16 NOTE — Telephone Encounter (Signed)
Message sent through my chart

## 2018-06-16 NOTE — Telephone Encounter (Signed)
Received message from pt's daughter that losartan is available through mail order.  Will forward to Dr. Angelena Form to see if pt can be changed back.

## 2018-06-18 ENCOUNTER — Encounter: Payer: Self-pay | Admitting: Orthopaedic Surgery

## 2018-06-19 ENCOUNTER — Other Ambulatory Visit: Payer: Self-pay | Admitting: Orthopaedic Surgery

## 2018-06-19 MED ORDER — TRAMADOL HCL 50 MG PO TABS: 50.0000 mg | ORAL_TABLET | Freq: Four times a day (QID) | ORAL | 0 refills | Status: DC | PRN

## 2018-06-25 ENCOUNTER — Encounter: Payer: Self-pay | Admitting: Orthopaedic Surgery

## 2018-06-26 ENCOUNTER — Telehealth: Payer: Self-pay | Admitting: *Deleted

## 2018-06-26 NOTE — Telephone Encounter (Signed)
I spoke with pt and her daughter regarding video visit with Dr. Angelena Form on May 29,2020. Daughter will check pt's BP, pulse and oxygen sat morning of appt.  She will use Kardia app to send reading the evening before. Consent sent through my chart.  Phone number for visit is [phone]

## 2018-06-27 ENCOUNTER — Telehealth (INDEPENDENT_AMBULATORY_CARE_PROVIDER_SITE_OTHER): Payer: Medicare Other | Admitting: Cardiovascular Disease

## 2018-06-27 ENCOUNTER — Other Ambulatory Visit: Payer: Self-pay

## 2018-06-27 ENCOUNTER — Encounter: Payer: Self-pay | Admitting: Cardiovascular Disease

## 2018-06-27 VITALS — Ht 65.0 in

## 2018-06-27 DIAGNOSIS — I447 Left bundle-branch block, unspecified: Secondary | ICD-10-CM | POA: Diagnosis not present

## 2018-06-27 DIAGNOSIS — I340 Nonrheumatic mitral (valve) insufficiency: Secondary | ICD-10-CM

## 2018-06-27 DIAGNOSIS — I2510 Atherosclerotic heart disease of native coronary artery without angina pectoris: Secondary | ICD-10-CM

## 2018-06-27 DIAGNOSIS — I350 Nonrheumatic aortic (valve) stenosis: Secondary | ICD-10-CM

## 2018-06-27 DIAGNOSIS — I5022 Chronic systolic (congestive) heart failure: Secondary | ICD-10-CM | POA: Diagnosis not present

## 2018-06-27 DIAGNOSIS — I255 Ischemic cardiomyopathy: Secondary | ICD-10-CM | POA: Diagnosis not present

## 2018-06-27 NOTE — Progress Notes (Signed)
Virtual Visit via Video Note   This visit type was conducted due to national recommendations for restrictions regarding the COVID-19 Pandemic (e.g. social distancing) in an effort to limit this patient's exposure and mitigate transmission in our community.  Due to her co-morbid illnesses, this patient is at least at moderate risk for complications without adequate follow up.  This format is felt to be most appropriate for this patient at this time.  All issues noted in this document were discussed and addressed.  A limited physical exam was performed with this format.  Please refer to the patient's chart for her consent to telehealth for [facility].   Date:  06/27/2018   ID:  Beth Johnston, DOB 03/15/1925, MRN [id number]  Patient Location: Home Provider Location: Home  PCP:  Patient, No Pcp Per  Cardiologist:  Lauree Chandler, MD  Electrophysiologist:  None   Evaluation Performed:  Follow-Up Visit  Chief Complaint:  Follow up- CAD  History of Present Illness:    Beth Johnston is a 85 y.o. female with history of CAD, ischemic cardiomyopathy, LBBB, mitral regurgitation and aortic stenosis who is being seen today by virtual e-visit due to the [place] pandemic. She was admitted to [facility] in November of 2015 with a NSTEMI and was found to have severe calcific disease in the LAD by cath 12/28/13. LVEF was 10%. She refused CABG and underwent high risk PCI/rotablator atherectomy of the LAD with Impella LV support. 2 drug eluting stents were placed in the LAD. Echo May 2019 with LVEF=30-35%, mild to moderate AS, mild MR. She has been on Losartan. This was changed due to backorder of Losartan and she did not feel well while on Telmisartan. She is now back on Losartan and doing well.   The patient denies chest pain, dyspnea, palpitations, dizziness, near syncope or syncope. No lower extremity edema.   The patient does not have symptoms concerning for COVID-19 infection (fever, chills,  cough, or new shortness of breath).    Past Medical History:  Diagnosis Date  . Arthritis    "mostly my hands" (12/25/2013)  . Asthma   . Ischemic cardiomyopathy    a. EF 15%  . PONV (postoperative nausea and vomiting)   . Upper airway cough syndrome    Post-nasal drip   Past Surgical History:  Procedure Laterality Date  . APPENDECTOMY  1960's  . CARDIAC CATHETERIZATION  12/28/2013   Procedure: RIGHT/LEFT HEART CATH AND CORONARY ANGIOGRAPHY;  Surgeon: Troy Sine, MD;  Location: [facility] CATH LAB;  Service: Cardiovascular;;  . CYSTOCELE REPAIR  12/18/2011   Procedure: ANTERIOR REPAIR (CYSTOCELE);  Surgeon: Gus Height, MD;  Location: [place] [facility];  Service: Gynecology;  Laterality: N/A;  . JOINT REPLACEMENT    . JOINT REPLACEMENT Right 1980's   "ring finger"  . PERCUTANEOUS CORONARY STENT INTERVENTION (PCI-S) N/A 12/30/2013   Procedure: PERCUTANEOUS CORONARY STENT INTERVENTION (PCI-S);  Surgeon: Burnell Blanks, MD;  Location: [facility] CATH LAB;  Service: Cardiovascular;  Laterality: N/A;  . REPLACEMENT TOTAL KNEE BILATERAL Bilateral 2009     Current Meds  Medication Sig  . acetaminophen (TYLENOL) 650 MG CR tablet Take 650 mg by mouth every 8 (eight) hours as needed for pain.  Marland Kitchen amoxicillin (AMOXIL) 500 MG capsule Take 4 capsules by mouth as needed. (Take 1 hour prior for dental procedures)  . atorvastatin (LIPITOR) 40 MG tablet TAKE 1/2 (ONE-HALF) TABLET BY MOUTH ONCE DAILY AT 6PM  . carvedilol (COREG) 6.25 MG tablet TAKE  1 TABLET BY MOUTH TWICE DAILY WITH A MEAL  . clopidogrel (PLAVIX) 75 MG tablet Take 1 tablet (75 mg total) by mouth daily with breakfast.  . furosemide (LASIX) 40 MG tablet Take 1 tablet (40 mg total) by mouth daily.  Marland Kitchen losartan (COZAAR) 25 MG tablet Take 1 tablet (25 mg total) by mouth daily.  Marland Kitchen spironolactone (ALDACTONE) 25 MG tablet TAKE 1/2 (ONE-HALF) TABLET BY MOUTH TWICE DAILY AT  8AM  AND  6PM     Allergies:   Lisinopril   Social History   Tobacco Use  .  Smoking status: Never Smoker  . Smokeless tobacco: Never Used  Substance Use Topics  . Alcohol use: No  . Drug use: No     Family Hx: The patient's family history includes Cerebral aneurysm in her mother; Healthy in her father.  ROS:   Please see the history of present illness.    All other systems reviewed and are negative.   Prior CV studies:   The following studies were reviewed today:  Echo May 2019: - Left ventricle: The cavity size was normal. There was moderate   concentric hypertrophy. Systolic function was moderately to   severely reduced. The estimated ejection fraction was in the   range of 30% to 35%. There was dynamic obstruction, with a peak   velocity of 390 cm/sec and a peak gradient of 61 mm Hg. Akinesis   of the basal-midanteroseptal, inferior, and inferoseptal   myocardium. Left ventricular diastolic function parameters were   normal. Doppler parameters are consistent with indeterminate   ventricular filling pressure. - Aortic valve: There was mild stenosis. Peak velocity (S): 238   cm/s. Mean gradient (S): 12 mm Hg. - Mitral valve: Transvalvular velocity was within the normal range.   There was no evidence for stenosis. There was mild regurgitation. - Left atrium: The atrium was moderately dilated. - Right ventricle: The cavity size was normal. Wall thickness was   normal. Systolic function was normal. - Tricuspid valve: There was trivial regurgitation.  Labs/Other Tests and Data Reviewed:    EKG:  No ECG reviewed.  Recent Labs: 07/08/2017: ALT 13; BUN 30; Creatinine, Ser 1.06; Potassium 4.5; Sodium 140   Recent Lipid Panel Lab Results  Component Value Date/Time   CHOL 148 07/08/2017 02:37 PM   TRIG 81 07/08/2017 02:37 PM   HDL 56 07/08/2017 02:37 PM   CHOLHDL 2.6 07/08/2017 02:37 PM   CHOLHDL 2.2 01/04/2016 10:58 AM   LDLCALC 76 07/08/2017 02:37 PM    Wt Readings from Last 3 Encounters:  01/13/18 162 lb 12.8 oz (73.8 kg)  07/08/17 158 lb  (71.7 kg)  05/08/17 155 lb (70.3 kg)     Objective:    Vital Signs: BP 100/54 P 86 RR 12   Ht 5\' 5"  (1.651 m)   BMI 27.09 kg/m    VITAL SIGNS:  reviewed GEN:  no acute distress  ASSESSMENT & PLAN:    1. CAD without angina: No chest pain. Continue Plavix, beta blocker and statin.  EKG from [place] reviewed today. She is in sinus with LBBB  2. Ischemic cardiomyopathy: LVEF 30-35% by echo May 2019.This is unchanged. Will continue beta blocker, ARB and aldactone.    3. Chronic systolic CHF: Weight is stable. Continue Lasix  4. LBBB: Chronic  5. Mitral regurgitation: Mild by echo in May 2019. Repeat echo May 2021.  6. Aortic stenosis: She had moderate AS by echo in May 2019 but likely low gradient/low output severe  AS due to LV systolic dysfunction. She remains asymptomatic. Will repeat echo May 2021  7. Hyperlipidemia: LDL near goal. Will continue statin. Repeat labs when I see her in 6 months   8. LVH: Moderate LVH on echo May 2019.   9. Cough: She has a constant cough for years. She has nasal drainage constantly. I think her cough is from post nasal drip. NO improvement when holding ARB in 2019.    COVID-19 Education: The signs and symptoms of COVID-19 were discussed with the patient and how to seek care for testing (follow up with PCP or arrange E-visit).  The importance of social distancing was discussed today.  Time:   Today, I have spent 20 minutes with the patient with telehealth technology discussing the above problems.     Medication Adjustments/Labs and Tests Ordered: Current medicines are reviewed at length with the patient today.  Concerns regarding medicines are outlined above.   Tests Ordered: No orders of the defined types were placed in this encounter.   Medication Changes: No orders of the defined types were placed in this encounter.   Disposition:  Follow up in 6 month(s)  Signed, Lauree Chandler, MD  06/27/2018 11:24 AM    [facility]

## 2018-06-27 NOTE — Patient Instructions (Signed)
Medication Instructions:  Your physician recommends that you continue on your current medications as directed. Please refer to the Current Medication list given to you today.  If you need a refill on your cardiac medications before your next appointment, please call your pharmacy.   Lab work: Will be done in 6 months on day of office visit. This will be fasting lab work If you have labs (blood work) drawn today and your tests are completely normal, you will receive your results only by: Marland Kitchen MyChart Message (if you have MyChart) OR . A paper copy in the mail If you have any lab test that is abnormal or we need to change your treatment, we will call you to review the results.  Testing/Procedures: none  Follow-Up: At [facility], you and your health needs are our priority.  As part of our continuing mission to provide you with exceptional heart care, we have created designated Provider Care Teams.  These Care Teams include your primary Cardiologist (physician) and Advanced Practice Providers (APPs -  Physician Assistants and Nurse Practitioners) who all work together to provide you with the care you need, when you need it. You will need a follow up appointment in 6 months.  Please call our office 2 months in advance to schedule this appointment.  You may see Lauree Chandler, MD or one of the following Advanced Practice Providers on your designated Care Team:   [place], PA-C Melina Copa, PA-C . Ermalinda Barrios, PA-C  Any Other Special Instructions Will Be Listed Below (If Applicable).

## 2018-06-30 DIAGNOSIS — M19012 Primary osteoarthritis, left shoulder: Secondary | ICD-10-CM | POA: Diagnosis not present

## 2018-06-30 DIAGNOSIS — I10 Essential (primary) hypertension: Secondary | ICD-10-CM | POA: Diagnosis not present

## 2018-06-30 DIAGNOSIS — M1612 Unilateral primary osteoarthritis, left hip: Secondary | ICD-10-CM | POA: Diagnosis not present

## 2018-06-30 DIAGNOSIS — I252 Old myocardial infarction: Secondary | ICD-10-CM | POA: Diagnosis not present

## 2018-06-30 DIAGNOSIS — M19011 Primary osteoarthritis, right shoulder: Secondary | ICD-10-CM | POA: Diagnosis not present

## 2018-07-03 DIAGNOSIS — M19012 Primary osteoarthritis, left shoulder: Secondary | ICD-10-CM | POA: Diagnosis not present

## 2018-07-03 DIAGNOSIS — M19011 Primary osteoarthritis, right shoulder: Secondary | ICD-10-CM | POA: Diagnosis not present

## 2018-07-03 DIAGNOSIS — I10 Essential (primary) hypertension: Secondary | ICD-10-CM | POA: Diagnosis not present

## 2018-07-03 DIAGNOSIS — I252 Old myocardial infarction: Secondary | ICD-10-CM | POA: Diagnosis not present

## 2018-07-03 DIAGNOSIS — M1612 Unilateral primary osteoarthritis, left hip: Secondary | ICD-10-CM | POA: Diagnosis not present

## 2018-07-07 DIAGNOSIS — M19011 Primary osteoarthritis, right shoulder: Secondary | ICD-10-CM | POA: Diagnosis not present

## 2018-07-07 DIAGNOSIS — I252 Old myocardial infarction: Secondary | ICD-10-CM | POA: Diagnosis not present

## 2018-07-07 DIAGNOSIS — I10 Essential (primary) hypertension: Secondary | ICD-10-CM | POA: Diagnosis not present

## 2018-07-07 DIAGNOSIS — M1612 Unilateral primary osteoarthritis, left hip: Secondary | ICD-10-CM | POA: Diagnosis not present

## 2018-07-07 DIAGNOSIS — M19012 Primary osteoarthritis, left shoulder: Secondary | ICD-10-CM | POA: Diagnosis not present

## 2018-07-09 DIAGNOSIS — I10 Essential (primary) hypertension: Secondary | ICD-10-CM | POA: Diagnosis not present

## 2018-07-09 DIAGNOSIS — M1612 Unilateral primary osteoarthritis, left hip: Secondary | ICD-10-CM | POA: Diagnosis not present

## 2018-07-09 DIAGNOSIS — M19012 Primary osteoarthritis, left shoulder: Secondary | ICD-10-CM | POA: Diagnosis not present

## 2018-07-09 DIAGNOSIS — M19011 Primary osteoarthritis, right shoulder: Secondary | ICD-10-CM | POA: Diagnosis not present

## 2018-07-09 DIAGNOSIS — I252 Old myocardial infarction: Secondary | ICD-10-CM | POA: Diagnosis not present

## 2018-07-14 ENCOUNTER — Ambulatory Visit: Payer: Medicare Other | Admitting: Cardiovascular Disease

## 2018-07-16 DIAGNOSIS — I252 Old myocardial infarction: Secondary | ICD-10-CM | POA: Diagnosis not present

## 2018-07-16 DIAGNOSIS — M19011 Primary osteoarthritis, right shoulder: Secondary | ICD-10-CM | POA: Diagnosis not present

## 2018-07-16 DIAGNOSIS — M1612 Unilateral primary osteoarthritis, left hip: Secondary | ICD-10-CM | POA: Diagnosis not present

## 2018-07-16 DIAGNOSIS — M19012 Primary osteoarthritis, left shoulder: Secondary | ICD-10-CM | POA: Diagnosis not present

## 2018-07-16 DIAGNOSIS — I10 Essential (primary) hypertension: Secondary | ICD-10-CM | POA: Diagnosis not present

## 2018-07-17 DIAGNOSIS — I252 Old myocardial infarction: Secondary | ICD-10-CM | POA: Diagnosis not present

## 2018-07-17 DIAGNOSIS — M1612 Unilateral primary osteoarthritis, left hip: Secondary | ICD-10-CM | POA: Diagnosis not present

## 2018-07-17 DIAGNOSIS — I10 Essential (primary) hypertension: Secondary | ICD-10-CM | POA: Diagnosis not present

## 2018-07-17 DIAGNOSIS — M19011 Primary osteoarthritis, right shoulder: Secondary | ICD-10-CM | POA: Diagnosis not present

## 2018-07-17 DIAGNOSIS — M19012 Primary osteoarthritis, left shoulder: Secondary | ICD-10-CM | POA: Diagnosis not present

## 2018-07-18 ENCOUNTER — Other Ambulatory Visit: Payer: Self-pay | Admitting: Cardiovascular Disease

## 2018-07-21 DIAGNOSIS — I252 Old myocardial infarction: Secondary | ICD-10-CM | POA: Diagnosis not present

## 2018-07-21 DIAGNOSIS — M19012 Primary osteoarthritis, left shoulder: Secondary | ICD-10-CM | POA: Diagnosis not present

## 2018-07-21 DIAGNOSIS — M19011 Primary osteoarthritis, right shoulder: Secondary | ICD-10-CM | POA: Diagnosis not present

## 2018-07-21 DIAGNOSIS — I10 Essential (primary) hypertension: Secondary | ICD-10-CM | POA: Diagnosis not present

## 2018-07-21 DIAGNOSIS — M1612 Unilateral primary osteoarthritis, left hip: Secondary | ICD-10-CM | POA: Diagnosis not present

## 2018-07-24 ENCOUNTER — Telehealth: Payer: Self-pay | Admitting: Orthopaedic Surgery

## 2018-07-24 DIAGNOSIS — I10 Essential (primary) hypertension: Secondary | ICD-10-CM | POA: Diagnosis not present

## 2018-07-24 DIAGNOSIS — I252 Old myocardial infarction: Secondary | ICD-10-CM | POA: Diagnosis not present

## 2018-07-24 DIAGNOSIS — M19012 Primary osteoarthritis, left shoulder: Secondary | ICD-10-CM | POA: Diagnosis not present

## 2018-07-24 DIAGNOSIS — M19011 Primary osteoarthritis, right shoulder: Secondary | ICD-10-CM | POA: Diagnosis not present

## 2018-07-24 DIAGNOSIS — M1612 Unilateral primary osteoarthritis, left hip: Secondary | ICD-10-CM | POA: Diagnosis not present

## 2018-07-24 NOTE — Telephone Encounter (Signed)
Beth Johnston with [facility] called in said she needs verbal orders to extend physical therapy for 1 time a week for 5 weeks.  [phone]

## 2018-07-25 NOTE — Telephone Encounter (Signed)
Verbal order left on voicemail.  

## 2018-07-28 DIAGNOSIS — I252 Old myocardial infarction: Secondary | ICD-10-CM | POA: Diagnosis not present

## 2018-07-28 DIAGNOSIS — M1612 Unilateral primary osteoarthritis, left hip: Secondary | ICD-10-CM | POA: Diagnosis not present

## 2018-07-28 DIAGNOSIS — M19012 Primary osteoarthritis, left shoulder: Secondary | ICD-10-CM | POA: Diagnosis not present

## 2018-07-28 DIAGNOSIS — I10 Essential (primary) hypertension: Secondary | ICD-10-CM | POA: Diagnosis not present

## 2018-07-28 DIAGNOSIS — M19011 Primary osteoarthritis, right shoulder: Secondary | ICD-10-CM | POA: Diagnosis not present

## 2018-07-30 DIAGNOSIS — M19012 Primary osteoarthritis, left shoulder: Secondary | ICD-10-CM | POA: Diagnosis not present

## 2018-07-30 DIAGNOSIS — I252 Old myocardial infarction: Secondary | ICD-10-CM | POA: Diagnosis not present

## 2018-07-30 DIAGNOSIS — M19011 Primary osteoarthritis, right shoulder: Secondary | ICD-10-CM | POA: Diagnosis not present

## 2018-07-30 DIAGNOSIS — M1612 Unilateral primary osteoarthritis, left hip: Secondary | ICD-10-CM | POA: Diagnosis not present

## 2018-07-30 DIAGNOSIS — I10 Essential (primary) hypertension: Secondary | ICD-10-CM | POA: Diagnosis not present

## 2018-08-04 DIAGNOSIS — I252 Old myocardial infarction: Secondary | ICD-10-CM | POA: Diagnosis not present

## 2018-08-04 DIAGNOSIS — I10 Essential (primary) hypertension: Secondary | ICD-10-CM | POA: Diagnosis not present

## 2018-08-04 DIAGNOSIS — M19012 Primary osteoarthritis, left shoulder: Secondary | ICD-10-CM | POA: Diagnosis not present

## 2018-08-04 DIAGNOSIS — M19011 Primary osteoarthritis, right shoulder: Secondary | ICD-10-CM | POA: Diagnosis not present

## 2018-08-04 DIAGNOSIS — M1612 Unilateral primary osteoarthritis, left hip: Secondary | ICD-10-CM | POA: Diagnosis not present

## 2018-08-05 ENCOUNTER — Ambulatory Visit (INDEPENDENT_AMBULATORY_CARE_PROVIDER_SITE_OTHER): Payer: Medicare Other | Admitting: Orthopaedic Surgery

## 2018-08-05 ENCOUNTER — Encounter: Payer: Self-pay | Admitting: Orthopaedic Surgery

## 2018-08-05 ENCOUNTER — Other Ambulatory Visit: Payer: Self-pay

## 2018-08-05 DIAGNOSIS — G8929 Other chronic pain: Secondary | ICD-10-CM | POA: Diagnosis not present

## 2018-08-05 DIAGNOSIS — M25511 Pain in right shoulder: Secondary | ICD-10-CM

## 2018-08-05 DIAGNOSIS — I255 Ischemic cardiomyopathy: Secondary | ICD-10-CM

## 2018-08-05 DIAGNOSIS — M25512 Pain in left shoulder: Secondary | ICD-10-CM

## 2018-08-05 DIAGNOSIS — M1612 Unilateral primary osteoarthritis, left hip: Secondary | ICD-10-CM | POA: Diagnosis not present

## 2018-08-05 MED ORDER — METHYLPREDNISOLONE ACETATE 40 MG/ML IJ SUSP
40.0000 mg | INTRAMUSCULAR | Status: AC | PRN
  Administered 2018-08-05: 40 mg via INTRA_ARTICULAR

## 2018-08-05 MED ORDER — LIDOCAINE HCL 1 % IJ SOLN
3.0000 mL | INTRAMUSCULAR | Status: AC | PRN
  Administered 2018-08-05: 3 mL

## 2018-08-05 NOTE — Progress Notes (Signed)
Office Visit Note   Patient: Beth Johnston           Date of Birth: Jun 05, 1925           MRN: [id number] Visit Date: 08/05/2018              Requested by: No referring provider defined for this encounter. PCP: Patient, No Pcp Per   Assessment & Plan: Visit Diagnoses: No diagnosis found.  Plan: I did talk in length in detail about the potential for hip replacement surgery in the future.  She is still wanting to try injections and I agree with this as well.  She understands risk and benefits of injections.  I was able to inject both shoulders today with steroid and subacromial space.  We will consult Dr. Junius Roads this morning about ultrasound-guided left hip steroid injection to treat her osteoarthritis pain in her left hip.  Follow-up can be done as needed.  She knows to wait several months between injections.    Follow-Up Instructions: Return if symptoms worsen or fail to improve.   Orders:  No orders of the defined types were placed in this encounter.  No orders of the defined types were placed in this encounter.     Procedures: Large Joint Inj: R subacromial bursa on 08/05/2018 8:37 AM Indications: pain and diagnostic evaluation Details: 22 G 1.5 in needle  Arthrogram: No  Medications: 3 mL lidocaine 1 %; 40 mg methylPREDNISolone acetate 40 MG/ML Outcome: tolerated well, no immediate complications Procedure, treatment alternatives, risks and benefits explained, specific risks discussed. Consent was given by the patient. Immediately prior to procedure a time out was called to verify the correct patient, procedure, equipment, support staff and site/side marked as required. Patient was prepped and draped in the usual sterile fashion.   Large Joint Inj: L subacromial bursa on 08/05/2018 8:37 AM Indications: pain and diagnostic evaluation Details: 22 G 1.5 in needle  Arthrogram: No  Medications: 3 mL lidocaine 1 %; 40 mg methylPREDNISolone acetate 40 MG/ML Outcome: tolerated  well, no immediate complications Procedure, treatment alternatives, risks and benefits explained, specific risks discussed. Consent was given by the patient. Immediately prior to procedure a time out was called to verify the correct patient, procedure, equipment, support staff and site/side marked as required. Patient was prepped and draped in the usual sterile fashion.       Clinical Data: No additional findings.   Subjective: Chief Complaint  Patient presents with  . Left Shoulder - Pain  . Right Shoulder - Pain  The patient is well-known to [name].  She is a 85 year old female with bilateral shoulder pain and impingement as well as severe end-stage arthritis in her left hip.  She comes in today for injections in both shoulders.  She has had an intra-articular injection in her left hip under fluoroscopy by Dr. Ernestina Patches.  We will likely consider having an injection by Dr. Junius Roads under ultrasound today in her left hip.  She is not a diabetic.  All 3 areas are hurting her like they have in the past.  She is had no other acute changes in her medical status.  However her pain is detriment affecting her mobility, her quality of life, and her actives daily living.  HPI  Review of Systems She currently denies any headache, chest pain, short of breath, fever, chills, nausea, vomiting  Objective: Vital Signs: There were no vitals taken for this visit.  Physical Exam She is alert and oriented x3 and in  no acute distress.  She walks very slowly and obviously favors her left hip. Ortho Exam Examination of her right hip is normal examination of her left hip shows pain with internal and external rotation as well as stiffness and limitations with rotation.  Both shoulders have decreased mobility in terms of overhead activities but are well located.  Both shoulder shows signs of impingement. Specialty Comments:  No specialty comments available.  Imaging: No results found.   PMFS History: Patient  Active Problem List   Diagnosis Date Noted  . Unilateral primary osteoarthritis, left hip 03/24/2018  . Chronic pain of both shoulders 02/27/2016  . Acute heart failure ([place])   . Elevated troponin   . Stented coronary artery   . Acute respiratory failure ([place]) 01/02/2014  . CAD S/P high risk LAD PCI/DES 12/30/13 01/02/2014  . Pseudoaneurysm following PCI 01/02/2014  . Blood loss anemia 01/02/2014  . Acute systolic CHF (congestive heart failure) ([place])   . NSTEMI (non-ST elevated myocardial infarction) ([place]) 12/26/2013  . Mitral regurgitation 12/26/2013  . Cardiomyopathy, ischemic-EF 15%-pre PCI 12/26/2013  . LBBB (left bundle branch block) 12/25/2013  . Asthma, persistent controlled 05/28/2013  . Upper airway cough syndrome 05/28/2013  . Cough 05/11/2013   Past Medical History:  Diagnosis Date  . Arthritis    "mostly my hands" (12/25/2013)  . Asthma   . Ischemic cardiomyopathy    a. EF 15%  . PONV (postoperative nausea and vomiting)   . Upper airway cough syndrome    Post-nasal drip    Family History  Problem Relation Age of Onset  . Cerebral aneurysm Mother        died at age 15  . Healthy Father        died of old age    Past Surgical History:  Procedure Laterality Date  . APPENDECTOMY  1960's  . CARDIAC CATHETERIZATION  12/28/2013   Procedure: RIGHT/LEFT HEART CATH AND CORONARY ANGIOGRAPHY;  Surgeon: Troy Sine, MD;  Location: [facility] CATH LAB;  Service: Cardiovascular;;  . CYSTOCELE REPAIR  12/18/2011   Procedure: ANTERIOR REPAIR (CYSTOCELE);  Surgeon: Gus Height, MD;  Location: [place] [facility];  Service: Gynecology;  Laterality: N/A;  . JOINT REPLACEMENT    . JOINT REPLACEMENT Right 1980's   "ring finger"  . PERCUTANEOUS CORONARY STENT INTERVENTION (PCI-S) N/A 12/30/2013   Procedure: PERCUTANEOUS CORONARY STENT INTERVENTION (PCI-S);  Surgeon: Burnell Blanks, MD;  Location: [facility] CATH LAB;  Service: Cardiovascular;  Laterality: N/A;  . REPLACEMENT TOTAL KNEE BILATERAL  Bilateral 2009   Social History   Occupational History  . Not on file  Tobacco Use  . Smoking status: Never Smoker  . Smokeless tobacco: Never Used  Substance and Sexual Activity  . Alcohol use: No  . Drug use: No  . Sexual activity: Not on file

## 2018-08-05 NOTE — Progress Notes (Signed)
Subjective: Patient is here for ultrasound-guided intra-articular left hip injection.   Severe DJD, did well with previous injection.  Objective:  Pain with IR.  Procedure: Ultrasound-guided left hip injection: After sterile prep with Betadine, injected 8 cc 1% lidocaine without epinephrine and 40 mg methylprednisolone using a 22-gauge spinal needle, passing the needle through the iliofemoral ligament into the femoral head/neck junction.  Injectate seen filling joint capsule.  Good immediate relief.

## 2018-08-07 ENCOUNTER — Telehealth: Payer: Self-pay | Admitting: Orthopaedic Surgery

## 2018-08-07 MED ORDER — CELECOXIB 100 MG PO CAPS: 100.0000 mg | ORAL_CAPSULE | Freq: Two times a day (BID) | ORAL | 2 refills | Status: DC | PRN

## 2018-08-07 NOTE — Telephone Encounter (Signed)
I forgot.  Sent some in just now.

## 2018-08-07 NOTE — Telephone Encounter (Signed)
Patient's daughter Al Corpus called advised the Rx for Celebrex is not showing at the pharmacy yet. [facility] [place]. The number to contact Joni is [phone]

## 2018-08-07 NOTE — Telephone Encounter (Signed)
See below

## 2018-08-08 ENCOUNTER — Other Ambulatory Visit: Payer: Self-pay | Admitting: Cardiovascular Disease

## 2018-08-11 ENCOUNTER — Ambulatory Visit: Payer: Medicare Other | Admitting: Orthopaedic Surgery

## 2018-08-13 DIAGNOSIS — M19011 Primary osteoarthritis, right shoulder: Secondary | ICD-10-CM | POA: Diagnosis not present

## 2018-08-13 DIAGNOSIS — M1612 Unilateral primary osteoarthritis, left hip: Secondary | ICD-10-CM | POA: Diagnosis not present

## 2018-08-13 DIAGNOSIS — I10 Essential (primary) hypertension: Secondary | ICD-10-CM | POA: Diagnosis not present

## 2018-08-13 DIAGNOSIS — M19012 Primary osteoarthritis, left shoulder: Secondary | ICD-10-CM | POA: Diagnosis not present

## 2018-08-13 DIAGNOSIS — I252 Old myocardial infarction: Secondary | ICD-10-CM | POA: Diagnosis not present

## 2018-08-18 ENCOUNTER — Encounter: Payer: Self-pay | Admitting: Orthopaedic Surgery

## 2018-08-18 DIAGNOSIS — I10 Essential (primary) hypertension: Secondary | ICD-10-CM | POA: Diagnosis not present

## 2018-08-18 DIAGNOSIS — M19011 Primary osteoarthritis, right shoulder: Secondary | ICD-10-CM | POA: Diagnosis not present

## 2018-08-18 DIAGNOSIS — M1612 Unilateral primary osteoarthritis, left hip: Secondary | ICD-10-CM | POA: Diagnosis not present

## 2018-08-18 DIAGNOSIS — I252 Old myocardial infarction: Secondary | ICD-10-CM | POA: Diagnosis not present

## 2018-08-18 DIAGNOSIS — M19012 Primary osteoarthritis, left shoulder: Secondary | ICD-10-CM | POA: Diagnosis not present

## 2018-08-25 DIAGNOSIS — M19011 Primary osteoarthritis, right shoulder: Secondary | ICD-10-CM | POA: Diagnosis not present

## 2018-08-25 DIAGNOSIS — I10 Essential (primary) hypertension: Secondary | ICD-10-CM | POA: Diagnosis not present

## 2018-08-25 DIAGNOSIS — M1612 Unilateral primary osteoarthritis, left hip: Secondary | ICD-10-CM | POA: Diagnosis not present

## 2018-08-25 DIAGNOSIS — I252 Old myocardial infarction: Secondary | ICD-10-CM | POA: Diagnosis not present

## 2018-08-25 DIAGNOSIS — M19012 Primary osteoarthritis, left shoulder: Secondary | ICD-10-CM | POA: Diagnosis not present

## 2018-08-27 ENCOUNTER — Ambulatory Visit: Payer: Medicare Other | Admitting: Orthopaedic Surgery

## 2018-09-01 ENCOUNTER — Other Ambulatory Visit: Payer: Self-pay | Admitting: Cardiovascular Disease

## 2018-09-03 ENCOUNTER — Other Ambulatory Visit: Payer: Self-pay | Admitting: Orthopaedic Surgery

## 2018-09-15 ENCOUNTER — Other Ambulatory Visit: Payer: Self-pay | Admitting: Physician Assistant

## 2018-09-23 ENCOUNTER — Encounter (HOSPITAL_COMMUNITY)
Admission: RE | Admit: 2018-09-23 | Discharge: 2018-09-23 | Disposition: A | Discharge status: Home / Self Care | Payer: Medicare Other | Source: Ambulatory Visit | Attending: Orthopaedic Surgery | Admitting: Orthopaedic Surgery

## 2018-09-23 ENCOUNTER — Encounter (HOSPITAL_COMMUNITY): Payer: Self-pay

## 2018-09-23 ENCOUNTER — Other Ambulatory Visit (HOSPITAL_COMMUNITY)
Admission: RE | Admit: 2018-09-23 | Discharge: 2018-09-23 | Disposition: A | Discharge status: Home / Self Care | Payer: Medicare Other | Source: Ambulatory Visit | Attending: Orthopaedic Surgery | Admitting: Orthopaedic Surgery

## 2018-09-23 ENCOUNTER — Other Ambulatory Visit: Payer: Self-pay

## 2018-09-23 DIAGNOSIS — Z955 Presence of coronary angioplasty implant and graft: Secondary | ICD-10-CM | POA: Diagnosis not present

## 2018-09-23 DIAGNOSIS — I447 Left bundle-branch block, unspecified: Secondary | ICD-10-CM | POA: Insufficient documentation

## 2018-09-23 DIAGNOSIS — Z01818 Encounter for other preprocedural examination: Secondary | ICD-10-CM | POA: Diagnosis not present

## 2018-09-23 DIAGNOSIS — Z79899 Other long term (current) drug therapy: Secondary | ICD-10-CM | POA: Insufficient documentation

## 2018-09-23 DIAGNOSIS — Z7902 Long term (current) use of antithrombotics/antiplatelets: Secondary | ICD-10-CM | POA: Insufficient documentation

## 2018-09-23 DIAGNOSIS — I255 Ischemic cardiomyopathy: Secondary | ICD-10-CM | POA: Diagnosis not present

## 2018-09-23 DIAGNOSIS — M1612 Unilateral primary osteoarthritis, left hip: Secondary | ICD-10-CM | POA: Insufficient documentation

## 2018-09-23 DIAGNOSIS — I080 Rheumatic disorders of both mitral and aortic valves: Secondary | ICD-10-CM | POA: Insufficient documentation

## 2018-09-23 DIAGNOSIS — Z20828 Contact with and (suspected) exposure to other viral communicable diseases: Secondary | ICD-10-CM | POA: Insufficient documentation

## 2018-09-23 LAB — CBC
HCT: 30 % — ABNORMAL LOW (ref 36.0–46.0)
Hemoglobin: 8.8 g/dL — ABNORMAL LOW (ref 12.0–15.0)
MCH: 26.7 pg (ref 26.0–34.0)
MCHC: 29.3 g/dL — ABNORMAL LOW (ref 30.0–36.0)
MCV: 91.2 fL (ref 80.0–100.0)
Platelets: 241 10*3/uL (ref 150–400)
RBC: 3.29 MIL/uL — ABNORMAL LOW (ref 3.87–5.11)
RDW: 13.1 % (ref 11.5–15.5)
WBC: 5.9 10*3/uL (ref 4.0–10.5)
nRBC: 0 % (ref 0.0–0.2)

## 2018-09-23 LAB — BASIC METABOLIC PANEL
Anion gap: 7 (ref 5–15)
BUN: 64 mg/dL — ABNORMAL HIGH (ref 8–23)
CO2: 22 mmol/L (ref 22–32)
Calcium: 9.6 mg/dL (ref 8.9–10.3)
Chloride: 110 mmol/L (ref 98–111)
Creatinine, Ser: 1.7 mg/dL — ABNORMAL HIGH (ref 0.44–1.00)
GFR calc Af Amer: 30 mL/min — ABNORMAL LOW (ref >60)
GFR calc non Af Amer: 26 mL/min — ABNORMAL LOW (ref >60)
Glucose, Bld: 124 mg/dL — ABNORMAL HIGH (ref 70–99)
Potassium: 4.9 mmol/L (ref 3.5–5.1)
Sodium: 139 mmol/L (ref 135–145)

## 2018-09-23 LAB — SARS CORONAVIRUS 2 (TAT 6-24 HRS): SARS Coronavirus 2: NEGATIVE

## 2018-09-23 NOTE — Patient Instructions (Addendum)
YOU NEED TO HAVE A COVID 19 TEST ON 09-23-18  @ 3:00 PM. THIS TEST MUST BE DONE BEFORE SURGERY, [address]  [address], [place] , [address]. ONCE YOUR COVID TEST IS COMPLETED, PLEASE BEGIN THE QUARANTINE INSTRUCTIONS AS OUTLINED IN YOUR HANDOUT.                Beth Johnston  09/23/2018   Your procedure is scheduled on: 09-26-18    Report to [facility] Main  Entrance    Report to Short Stay at 5:30 AM   1 VISITOR IS ALLOWED TO WAIT IN WAITING ROOM  ONLY DAY OF YOUR SURGERY. NO VISITORS ARE ALLOWED IN SHORT STAY OR RECOVERY ROOM.   Call this number if you have problems the morning of surgery [phone]    Remember: NO SOLID FOOD AFTER MIDNIGHT THE NIGHT PRIOR TO SURGERY. NOTHING BY MOUTH EXCEPT CLEAR LIQUIDS UNTIL 4:15 AM . PLEASE FINISH ENSURE DRINK PER SURGEON ORDER  WHICH NEEDS TO BE COMPLETED AT  4:15 AM.      CLEAR LIQUID DIET   Foods Allowed                                                                     Foods Excluded  Coffee and tea, regular and decaf                             liquids that you cannot  Plain Jell-O any favor except red or purple                                           see through such as: Fruit ices (not with fruit pulp)                                     milk, soups, orange juice  Iced Popsicles                                    All solid food Carbonated beverages, regular and diet                                    Cranberry, grape and apple juices Sports drinks like Gatorade Lightly seasoned clear broth or consume(fat free) Sugar, honey syrup  _____________________________________________________________________    Take these medicines the morning of surgery with A SIP OF WATER: Carvedilol (Coreg)  BRUSH YOUR TEETH MORNING OF SURGERY AND RINSE YOUR MOUTH OUT, NO CHEWING GUM CANDY OR MINTS.              You may not have any metal on your body including hair pins and              piercings     Do not wear jewelry,  make-up, lotions, powders or deodorant  Do not wear nail polish.  Do not shave  48 hours prior to surgery.             Do not bring valuables to the hospital. [place].  Contacts, dentures or bridgework may not be worn into surgery.  Leave suitcase in the car. After surgery it may be brought to your room.     Special Instructions: N/A              Please read over the following fact sheets you were given: _____________________________________________________________________             [facility] - Preparing for Surgery Before surgery, you can play an important role.  Because skin is not sterile, your skin needs to be as free of germs as possible.  You can reduce the number of germs on your skin by washing with CHG (chlorahexidine gluconate) soap before surgery.  CHG is an antiseptic cleaner which kills germs and bonds with the skin to continue killing germs even after washing. Please DO NOT use if you have an allergy to CHG or antibacterial soaps.  If your skin becomes reddened/irritated stop using the CHG and inform your nurse when you arrive at Short Stay. Do not shave (including legs and underarms) for at least 48 hours prior to the first CHG shower.  You may shave your face/neck. Please follow these instructions carefully:  1.  Shower with CHG Soap the night before surgery and the  morning of Surgery.  2.  If you choose to wash your hair, wash your hair first as usual with your  normal  shampoo.  3.  After you shampoo, rinse your hair and body thoroughly to remove the  shampoo.                           4.  Use CHG as you would any other liquid soap.  You can apply chg directly  to the skin and wash                       Gently with a scrungie or clean washcloth.  5.  Apply the CHG Soap to your body ONLY FROM THE NECK DOWN.   Do not use on face/ open                           Wound or open sores. Avoid contact with eyes,  ears mouth and genitals (private parts).                       Wash face,  Genitals (private parts) with your normal soap.             6.  Wash thoroughly, paying special attention to the area where your surgery  will be performed.  7.  Thoroughly rinse your body with warm water from the neck down.  8.  DO NOT shower/wash with your normal soap after using and rinsing off  the CHG Soap.                9.  Pat yourself dry with a clean towel.            10.  Wear clean pajamas.  11.  Place clean sheets on your bed the night of your first shower and do not  sleep with pets. Day of Surgery : Do not apply any lotions/deodorants the morning of surgery.  Please wear clean clothes to the hospital/surgery center.  FAILURE TO FOLLOW THESE INSTRUCTIONS MAY RESULT IN THE CANCELLATION OF YOUR SURGERY PATIENT SIGNATURE_________________________________  NURSE SIGNATURE__________________________________  ________________________________________________________________________

## 2018-09-23 NOTE — Progress Notes (Signed)
PCP -  None  Cardiologist - 06-27-18 Dr. Julianne Handler (No clearance noted)  Chest x-ray -  EKG - 01-13-18  Stress Test -  ECHO - 06-04-17  Cardiac Cath -   Sleep Study -  CPAP -   Fasting Blood Sugar -  Checks Blood Sugar _____ times a day  Blood Thinner Instructions: None, per pt Aspirin Instructions: Last Dose:  Anesthesia review: Hx of L BBB, NSTEMI, Heart Failure, Mild Stenosis  Abnormal Labs  Patient denies shortness of breath, fever, cough and chest pain at PAT appointment   Patient verbalized understanding of instructions that were given to them at the PAT appointment. Patient was also instructed that they will need to review over the PAT instructions again at home before surgery.

## 2018-09-24 ENCOUNTER — Encounter (HOSPITAL_COMMUNITY): Payer: Self-pay | Admitting: Physician Assistant

## 2018-09-24 ENCOUNTER — Encounter (HOSPITAL_COMMUNITY): Payer: Self-pay | Admitting: Anesthesiology

## 2018-09-24 LAB — SURGICAL PCR SCREEN
MRSA, PCR: POSITIVE — AB
Staphylococcus aureus: POSITIVE — AB

## 2018-09-24 NOTE — Progress Notes (Signed)
PCR results 09/24/2018 sent to Dr. Ninfa Linden via epic.

## 2018-09-25 ENCOUNTER — Telehealth: Payer: Self-pay | Admitting: Cardiovascular Disease

## 2018-09-25 DIAGNOSIS — R7989 Other specified abnormal findings of blood chemistry: Secondary | ICD-10-CM

## 2018-09-25 DIAGNOSIS — I5022 Chronic systolic (congestive) heart failure: Secondary | ICD-10-CM

## 2018-09-25 NOTE — Telephone Encounter (Signed)
Reviewed with Dr. Angelena Form who would like pt to hold aldactone, losartan and lasix.  Pt will need BMET next Tuesday or Wednesday.  She will need to postpone surgery.  Hgb and Hct lower than when last checked and Dr. Angelena Form recommends pt establish with primary care in case evaluation needed for this.  I spoke with pt's daughter and gave her above information.  Daughter will bring pt to office for lab work on September 2,2020 at 11:15.

## 2018-09-25 NOTE — Telephone Encounter (Signed)
I spoke with Beth Johnston's daughter. Beth Johnston is scheduled for hip replacement by Dr. Rush Farmer at [facility] tomorrow morning. Daughter received a call from Dr. Pryor Montes office that kidney function labs were abnormal and Beth Johnston would need to see primary care or have labs signed off on by primary care before surgery could be done.  Daughter reports due to severe hip pain Beth Johnston has not been getting up much and has limited fluid intake to keep from having to go to the bathroom.  Daughter feels Beth Johnston is probably dehydrated.  Beth Johnston does not have a primary care doctor.  Daughter is asking if Dr. Angelena Form could sign off on labs or give approval to Dr. Rush Farmer for Beth Johnston to proceed with surgery tomorrow.

## 2018-09-25 NOTE — Progress Notes (Signed)
Anesthesia Chart Review   Case: [id number] Date/Time: 09/26/18 0700   Procedure: LEFT TOTAL HIP ARTHROPLASTY ANTERIOR APPROACH (Left )   Anesthesia type: Spinal   Pre-op diagnosis: severe left hip osteoarthritis   Location: [facility] ROOM 09 / [facility]   Surgeon: Mcarthur Rossetti, MD      DISCUSSION:85 y.o. never smoker with h/o PONV, asthma, CAD (DES 2015), ischemic cardiomyopathy, LBBB, mitral regurgitation, aortic stenosis, severe left hip OA scheduled for above procedure 09/26/2018 with Dr. Jean Rosenthal.   Pt last seen by cardiologist, Dr. Lauree Chandler, 06/27/2018.  Stable at this visit.  Per OV note, "She had moderate AS by echo in May 2019 but likely low gradient/low output severe AS due to LV systolic dysfunction. She remains asymptomatic.Will repeat echo May 2021." Requested cardiac clearance due to ischemic cardiomyopathy, aortic stenosis, h/o DES.    Creatinine 1.7 at PAT, GFR 26; no h/o kidney disease.  Discussed with Dr. Fransisco Beau, pt needs to be evaluated by PCP regarding kidney function.  VS: BP 93/60   Pulse 74   Temp 36.7 C (Oral)   Resp 16   Ht 5\' 5"  (1.651 m)   Wt 67.1 kg   SpO2 100%   BMI 24.63 kg/m   PROVIDERS: Patient, No Pcp Per  Raynald Kemp, MD is Cardiologist  LABS: Routed to surgeon (all labs ordered are listed, but only abnormal results are displayed)  Labs Reviewed  SURGICAL PCR SCREEN - Abnormal; Notable for the following components:      Result Value   MRSA, PCR POSITIVE (*)    Staphylococcus aureus POSITIVE (*)    All other components within normal limits  BASIC METABOLIC PANEL - Abnormal; Notable for the following components:   Glucose, Bld 124 (*)    BUN 64 (*)    Creatinine, Ser 1.70 (*)    GFR calc non Af Amer 26 (*)    GFR calc Af Amer 30 (*)    All other components within normal limits  CBC - Abnormal; Notable for the following components:   RBC 3.29 (*)    Hemoglobin 8.8 (*)    HCT 30.0 (*)    MCHC 29.3 (*)    All other components within normal limits     IMAGES:   EKG: 01/13/18 Rate 77 bpm Normal sinus rhythm  Possible left atrial enlargement Left axis deviation  Left bundle branch block   CV: Echo 06/04/2017 Study Conclusions  - Left ventricle: The cavity size was normal. There was moderate   concentric hypertrophy. Systolic function was moderately to   severely reduced. The estimated ejection fraction was in the   range of 30% to 35%. There was dynamic obstruction, with a peak   velocity of 390 cm/sec and a peak gradient of 61 mm Hg. Akinesis   of the basal-midanteroseptal, inferior, and inferoseptal   myocardium. Left ventricular diastolic function parameters were   normal. Doppler parameters are consistent with indeterminate   ventricular filling pressure. - Aortic valve: There was mild stenosis. Peak velocity (S): 238   cm/s. Mean gradient (S): 12 mm Hg. - Mitral valve: Transvalvular velocity was within the normal range.   There was no evidence for stenosis. There was mild regurgitation. - Left atrium: The atrium was moderately dilated. - Right ventricle: The cavity size was normal. Wall thickness was   normal. Systolic function was normal. - Tricuspid valve: There was trivial regurgitation. Past Medical History:  Diagnosis Date  . Arthritis    "mostly my hands" (  12/25/2013)  . Asthma   . Ischemic cardiomyopathy    a. EF 15%  . PONV (postoperative nausea and vomiting)   . Upper airway cough syndrome    Post-nasal drip    Past Surgical History:  Procedure Laterality Date  . APPENDECTOMY  1960's  . CARDIAC CATHETERIZATION  12/28/2013   Procedure: RIGHT/LEFT HEART CATH AND CORONARY ANGIOGRAPHY;  Surgeon: Troy Sine, MD;  Location: [facility] C[facility] LAB;  Service: Cardiovascular;;  . CYSTOCELE REPAIR  12/18/2011   Procedure: ANTERIOR REPAIR (CYSTOCELE);  Surgeon: Gus Height, MD;  Location: [facility];  Service: Gynecology;  Laterality: N/A;  . JOINT REPLACEMENT    . JOINT  REPLACEMENT Right 1980's   "ring finger"  . PERCUTANEOUS CORONARY STENT INTERVENTION (PCI-S) N/A 12/30/2013   Procedure: PERCUTANEOUS CORONARY STENT INTERVENTION (PCI-S);  Surgeon: Burnell Blanks, MD;  Location: [facility] C[facility] LAB;  Service: Cardiovascular;  Laterality: N/A;  . REPLACEMENT TOTAL KNEE BILATERAL Bilateral 2009    MEDICATIONS: . acetaminophen (TYLENOL) 650 MG CR tablet  . amoxicillin (AMOXIL) 500 MG capsule  . atorvastatin (LIPITOR) 40 MG tablet  . carvedilol (COREG) 6.25 MG tablet  . celecoxib (CELEBREX) 100 MG capsule  . clopidogrel (PLAVIX) 75 MG tablet  . furosemide (LASIX) 40 MG tablet  . losartan (COZAAR) 25 MG tablet  . meloxicam (MOBIC) 7.5 MG tablet  . spironolactone (ALDACTONE) 25 MG tablet   No current facility-administered medications for this encounter.    [facility] Pre-Surgical Testing [phone] 09/25/18  11:31 AM

## 2018-09-25 NOTE — Telephone Encounter (Signed)
Patient's daughter has a couple of questions about her mother's blood work.

## 2018-09-25 NOTE — Telephone Encounter (Signed)
I communicated with pt's daughter through my chart and pt will have lab work drawn at [facility] on first floor on September 2

## 2018-09-25 NOTE — Telephone Encounter (Signed)
Agree. Thanks Pat.  

## 2018-09-26 ENCOUNTER — Encounter (HOSPITAL_COMMUNITY): Admission: RE | Payer: Self-pay | Source: Other Acute Inpatient Hospital

## 2018-09-26 ENCOUNTER — Ambulatory Visit (HOSPITAL_COMMUNITY)
Admission: RE | Admit: 2018-09-26 | Payer: Medicare Other | Source: Other Acute Inpatient Hospital | Admitting: Orthopaedic Surgery

## 2018-09-26 SURGERY — ARTHROPLASTY, HIP, TOTAL, ANTERIOR APPROACH
Anesthesia: Spinal | Laterality: Left

## 2018-10-01 ENCOUNTER — Other Ambulatory Visit: Payer: Medicare Other

## 2018-10-01 DIAGNOSIS — I5022 Chronic systolic (congestive) heart failure: Secondary | ICD-10-CM | POA: Diagnosis not present

## 2018-10-01 DIAGNOSIS — R7989 Other specified abnormal findings of blood chemistry: Secondary | ICD-10-CM | POA: Diagnosis not present

## 2018-10-02 ENCOUNTER — Telehealth: Payer: Self-pay | Admitting: *Deleted

## 2018-10-02 ENCOUNTER — Other Ambulatory Visit: Payer: Self-pay | Admitting: *Deleted

## 2018-10-02 ENCOUNTER — Encounter: Payer: Self-pay | Admitting: Cardiovascular Disease

## 2018-10-02 DIAGNOSIS — I2510 Atherosclerotic heart disease of native coronary artery without angina pectoris: Secondary | ICD-10-CM

## 2018-10-02 DIAGNOSIS — E7849 Other hyperlipidemia: Secondary | ICD-10-CM

## 2018-10-02 LAB — BASIC METABOLIC PANEL
BUN/Creatinine Ratio: 24 (ref 12–28)
BUN: 27 mg/dL (ref 10–36)
CO2: 23 mmol/L (ref 20–29)
Calcium: 9.7 mg/dL (ref 8.7–10.3)
Chloride: 103 mmol/L (ref 96–106)
Creatinine, Ser: 1.12 mg/dL — ABNORMAL HIGH (ref 0.57–1.00)
GFR calc Af Amer: 50 mL/min/{1.73_m2} — ABNORMAL LOW (ref >59)
GFR calc non Af Amer: 43 mL/min/{1.73_m2} — ABNORMAL LOW (ref >59)
Glucose: 78 mg/dL (ref 65–99)
Potassium: 5.1 mmol/L (ref 3.5–5.2)
Sodium: 136 mmol/L (ref 134–144)

## 2018-10-02 NOTE — Telephone Encounter (Signed)
I spoke with pt and her daughter and reviewed lab results and recommendations from Dr. Angelena Form with them. They report pt is feeling fine except for hip pain. No shortness of breath. No swelling. Sleeping OK.  Has not been weighing daily due to hip pain.  Normal weight for pt is 153 lbs but last week her weight had dropped to 147 lbs because she was not eating or drinking like she should. Has not been checking BP but will start doing so. Daughter and pt are asking if Dr. Angelena Form can now clear pt for hip surgery. If so daughter requests I contact Dr. Pryor Montes office.

## 2018-10-02 NOTE — Telephone Encounter (Signed)
Fax sent.

## 2018-10-02 NOTE — Telephone Encounter (Signed)
I spoke with pt's daughter and told her video appt is fine with Dr. Angelena Form.  Daughter will bring pt to [facility] on first floor of our building on December 1,2020 for fasting lab work.

## 2018-10-02 NOTE — Telephone Encounter (Signed)
I spoke with pt's daughter and let her know Dr. Angelena Form has written letter and it has been sent. Pt is due for follow up in December in our office. I scheduled this for December 4. Daughter would prefer this to be a virtual appointment if this is OK with Dr. Angelena Form.  She can have pt's lab work done prior to visit.

## 2018-10-02 NOTE — Telephone Encounter (Signed)
OK with me. cdm 

## 2018-10-02 NOTE — Telephone Encounter (Signed)
Beth Johnston, I wrote the letter. [name] will fax it. I will route it too. [name]

## 2018-10-03 ENCOUNTER — Encounter: Payer: Self-pay | Admitting: Orthopaedic Surgery

## 2018-10-07 ENCOUNTER — Other Ambulatory Visit: Payer: Self-pay

## 2018-10-07 ENCOUNTER — Other Ambulatory Visit (HOSPITAL_COMMUNITY)
Admission: RE | Admit: 2018-10-07 | Discharge: 2018-10-07 | Disposition: A | Discharge status: Home / Self Care | Payer: Medicare Other | Source: Ambulatory Visit | Attending: Orthopaedic Surgery | Admitting: Orthopaedic Surgery

## 2018-10-07 DIAGNOSIS — Z20828 Contact with and (suspected) exposure to other viral communicable diseases: Secondary | ICD-10-CM | POA: Insufficient documentation

## 2018-10-07 DIAGNOSIS — Z01812 Encounter for preprocedural laboratory examination: Secondary | ICD-10-CM | POA: Insufficient documentation

## 2018-10-07 NOTE — Progress Notes (Signed)
Daughter Al Corpus is aware of 6:00AM arrival time 10/10/2018. Verbalized understanding.

## 2018-10-08 LAB — NOVEL CORONAVIRUS, NAA (HOSP ORDER, SEND-OUT TO REF LAB; TAT 18-24 HRS): SARS-CoV-2, NAA: NOT DETECTED

## 2018-10-09 ENCOUNTER — Telehealth: Payer: Self-pay | Admitting: Orthopaedic Surgery

## 2018-10-09 ENCOUNTER — Telehealth: Payer: Self-pay | Admitting: *Deleted

## 2018-10-09 ENCOUNTER — Inpatient Hospital Stay: Payer: Medicare Other | Admitting: Physician Assistant

## 2018-10-09 NOTE — Telephone Encounter (Signed)
Ortho bundle pre-op call completed; Surgery canceled for 10/10/18; reschedule TBD.

## 2018-10-09 NOTE — Telephone Encounter (Signed)
Pt daughter Al Corpus called in said she was told to let dr.blackman know asap that the pt is on the medication clopidogrel 75mg   And pt did take the medication this morning due to nobody telling her not to and she is scheduled to have surgery with dr.blackman in the morning 10-10-2018 .  [phone]

## 2018-10-09 NOTE — Care Plan (Signed)
RNCM called to speak with patient and her daughter prior to scheduled surgery with Dr. Ninfa Linden tomorrow, 10/10/2018. Patient's daughter informed that anesthesia had contacted our office earlier in the day and spoke with [place], surgery scheduler, who was informed that due to not stopping Plavix 5 days prior to surgery, that a spinal could not be performed. General anesthesia would have to be done for surgery and MD did make note that there is always a possible increased risk of bleeding without stopping Plavix this close to surgery. Family discussed with patient and opted to cancel surgery for tomorrow until she can be rescheduled. Know to stop Plavix 5 days prior to surgery so that a spinal anesthesia can be done. Family verbalized understanding. MD and surgery scheduler aware. Reviewed previous pre-op information regarding bundle program prior to previous scheduled surgery, which was canceled due to abnormal labs. Family will be able to provide assistance after surgery. Anticipate HHPT. [facility] aware of referral once surgery is rescheduled.

## 2018-10-10 ENCOUNTER — Encounter (HOSPITAL_COMMUNITY): Admission: RE | Payer: Self-pay | Source: Home / Self Care

## 2018-10-10 ENCOUNTER — Ambulatory Visit (HOSPITAL_COMMUNITY): Admission: RE | Admit: 2018-10-10 | Payer: Medicare Other | Source: Home / Self Care | Admitting: Orthopaedic Surgery

## 2018-10-10 ENCOUNTER — Ambulatory Visit: Admit: 2018-10-10 | Payer: Medicare Other | Admitting: Orthopaedic Surgery

## 2018-10-10 SURGERY — ARTHROPLASTY, HIP, TOTAL, ANTERIOR APPROACH
Anesthesia: Spinal | Laterality: Left

## 2018-10-10 SURGERY — Surgical Case
Anesthesia: *Unknown

## 2018-10-13 ENCOUNTER — Telehealth: Payer: Self-pay | Admitting: *Deleted

## 2018-10-13 ENCOUNTER — Encounter: Payer: Self-pay | Admitting: Orthopaedic Surgery

## 2018-10-13 NOTE — Telephone Encounter (Signed)
Ortho bundle call completed. 

## 2018-10-13 NOTE — Care Plan (Signed)
RNCM given information that patient's surgery is rescheduled for 10/17/18. RNCM called patient's daughter Al Corpus, to confirm that surgery has now been rescheduled again for the Left total hip replacement with Dr. Ninfa Linden on Friday, 10/17/18. Patient's daughter had multiple questions regarding pre-surgery medications, Ensure drink, and lab work. RNCM will defer to pre-admission testing staff. Will contact them to have them contact daughter back to review all pre-op instructions via telephone again. Patient's daughter states she doesn't have the information sheet given prior to scheduling the first surgery that was canceled. F/U appointment with Dr. Ninfa Linden rescheduled for post-op on 10/30/18 at 4:00 pm. Daughter verbalized understanding of instructions including patient to not take any more Plavix this week prior to surgery.Left VM with pre-op testing requesting call to patient's daughter.

## 2018-10-14 ENCOUNTER — Telehealth: Payer: Self-pay | Admitting: *Deleted

## 2018-10-14 ENCOUNTER — Other Ambulatory Visit (HOSPITAL_COMMUNITY)
Admission: RE | Admit: 2018-10-14 | Discharge: 2018-10-14 | Disposition: A | Discharge status: Home / Self Care | Payer: Medicare Other | Source: Ambulatory Visit | Attending: Orthopaedic Surgery | Admitting: Orthopaedic Surgery

## 2018-10-14 DIAGNOSIS — Z01812 Encounter for preprocedural laboratory examination: Secondary | ICD-10-CM | POA: Insufficient documentation

## 2018-10-14 DIAGNOSIS — Z20828 Contact with and (suspected) exposure to other viral communicable diseases: Secondary | ICD-10-CM | POA: Insufficient documentation

## 2018-10-14 NOTE — Telephone Encounter (Signed)
[facility] called and said that all orders for surgery that were put in previously were discontinued. Orders need to be put back in for pending surgery on Friday, 10/17/18. Thanks.

## 2018-10-15 ENCOUNTER — Other Ambulatory Visit: Payer: Self-pay | Admitting: Physician Assistant

## 2018-10-15 ENCOUNTER — Other Ambulatory Visit: Payer: Self-pay

## 2018-10-15 ENCOUNTER — Encounter (HOSPITAL_COMMUNITY): Payer: Self-pay | Admitting: *Deleted

## 2018-10-15 ENCOUNTER — Other Ambulatory Visit (HOSPITAL_COMMUNITY): Payer: Self-pay | Admitting: *Deleted

## 2018-10-15 LAB — NOVEL CORONAVIRUS, NAA (HOSP ORDER, SEND-OUT TO REF LAB; TAT 18-24 HRS): SARS-CoV-2, NAA: NOT DETECTED

## 2018-10-15 NOTE — Progress Notes (Signed)
PCP -  Cardiologist -   PPM/ICD -  Device Orders -  Chart discussed with Konrad Felix on 10-15-18. Patient is now a same day. Pre-op call made.    Rep Notified  Chest x-ray -  EKG -01-13-18   Stress Test -  ECHO - 05-29-17 and LOV w/Cardiologist on 06-27-18  Cardiac Cath -   Sleep Study -  CPAP -   Fasting Blood Sugar -  Checks Blood Sugar _____ times a day  Blood Thinner Instructions: Plavix last taken on 9/13/120  Aspirin Instructions:  ERAS Protcol - PRE-SURGERY Ensure - To be consumed 3 hours prior to surgery  COVID TEST- 10/14/18  Results pending   Anesthesia review: 09/23/18  Patient denies shortness of breath, fever, cough and chest pain at PAT appointment   Patient verbalized understanding of instructions that were given to them at the PAT appointment. Patient was also instructed that they will need to review over the PAT instructions again at home before surgery.

## 2018-10-16 NOTE — Progress Notes (Signed)
Patient's daughter Delsa Grana called via phone  and instructed with new times for surgery. Instructed her to have patient at [facility] at [place] am on 10/17/2018. Patient to drink Ensure drink at 0645 am on 10/17/2018 and clear liquids from midnight up until 0645 am. Patient to take Carvedilol (Coreg) with a sip of water morning of surgery. Patient's daughter verbalized understanding of instructions.

## 2018-10-17 ENCOUNTER — Ambulatory Visit (HOSPITAL_COMMUNITY): Payer: Medicare Other

## 2018-10-17 ENCOUNTER — Ambulatory Visit (HOSPITAL_COMMUNITY): Payer: Medicare Other | Admitting: Registered Nurse

## 2018-10-17 ENCOUNTER — Encounter (HOSPITAL_COMMUNITY)
Admission: RE | Disposition: A | Discharge status: Home / Self Care | Payer: Self-pay | Source: Home / Self Care | Attending: Orthopaedic Surgery

## 2018-10-17 ENCOUNTER — Inpatient Hospital Stay (HOSPITAL_COMMUNITY)
Admission: RE | Admit: 2018-10-17 | Discharge: 2018-10-21 | DRG: 470 | Disposition: A | Discharge status: Home Health | Payer: Medicare Other | Attending: Orthopaedic Surgery | Admitting: Orthopaedic Surgery

## 2018-10-17 ENCOUNTER — Inpatient Hospital Stay (HOSPITAL_COMMUNITY): Payer: Medicare Other

## 2018-10-17 ENCOUNTER — Ambulatory Visit (HOSPITAL_COMMUNITY): Payer: Medicare Other | Admitting: Physician Assistant

## 2018-10-17 ENCOUNTER — Other Ambulatory Visit: Payer: Self-pay

## 2018-10-17 ENCOUNTER — Encounter (HOSPITAL_COMMUNITY): Payer: Self-pay | Admitting: *Deleted

## 2018-10-17 DIAGNOSIS — M1612 Unilateral primary osteoarthritis, left hip: Principal | ICD-10-CM | POA: Diagnosis present

## 2018-10-17 DIAGNOSIS — Z955 Presence of coronary angioplasty implant and graft: Secondary | ICD-10-CM

## 2018-10-17 DIAGNOSIS — G8929 Other chronic pain: Secondary | ICD-10-CM | POA: Diagnosis present

## 2018-10-17 DIAGNOSIS — Z419 Encounter for procedure for purposes other than remedying health state, unspecified: Secondary | ICD-10-CM

## 2018-10-17 DIAGNOSIS — I5022 Chronic systolic (congestive) heart failure: Secondary | ICD-10-CM | POA: Diagnosis present

## 2018-10-17 DIAGNOSIS — I252 Old myocardial infarction: Secondary | ICD-10-CM | POA: Diagnosis not present

## 2018-10-17 DIAGNOSIS — Z888 Allergy status to other drugs, medicaments and biological substances status: Secondary | ICD-10-CM | POA: Diagnosis not present

## 2018-10-17 DIAGNOSIS — D62 Acute posthemorrhagic anemia: Secondary | ICD-10-CM | POA: Diagnosis not present

## 2018-10-17 DIAGNOSIS — Z885 Allergy status to narcotic agent status: Secondary | ICD-10-CM

## 2018-10-17 DIAGNOSIS — I255 Ischemic cardiomyopathy: Secondary | ICD-10-CM | POA: Diagnosis present

## 2018-10-17 DIAGNOSIS — Z96653 Presence of artificial knee joint, bilateral: Secondary | ICD-10-CM | POA: Diagnosis present

## 2018-10-17 DIAGNOSIS — I5021 Acute systolic (congestive) heart failure: Secondary | ICD-10-CM | POA: Diagnosis not present

## 2018-10-17 DIAGNOSIS — Z96642 Presence of left artificial hip joint: Secondary | ICD-10-CM

## 2018-10-17 DIAGNOSIS — Z20828 Contact with and (suspected) exposure to other viral communicable diseases: Secondary | ICD-10-CM | POA: Diagnosis present

## 2018-10-17 DIAGNOSIS — Z8249 Family history of ischemic heart disease and other diseases of the circulatory system: Secondary | ICD-10-CM

## 2018-10-17 DIAGNOSIS — J4530 Mild persistent asthma, uncomplicated: Secondary | ICD-10-CM | POA: Diagnosis not present

## 2018-10-17 DIAGNOSIS — J45909 Unspecified asthma, uncomplicated: Secondary | ICD-10-CM | POA: Diagnosis present

## 2018-10-17 DIAGNOSIS — Z471 Aftercare following joint replacement surgery: Secondary | ICD-10-CM | POA: Diagnosis not present

## 2018-10-17 DIAGNOSIS — D500 Iron deficiency anemia secondary to blood loss (chronic): Secondary | ICD-10-CM | POA: Diagnosis not present

## 2018-10-17 HISTORY — PX: TOTAL HIP ARTHROPLASTY: SHX124

## 2018-10-17 LAB — CBC
HCT: 28 % — ABNORMAL LOW (ref 36.0–46.0)
Hemoglobin: 8.4 g/dL — ABNORMAL LOW (ref 12.0–15.0)
MCH: 27.3 pg (ref 26.0–34.0)
MCHC: 30 g/dL (ref 30.0–36.0)
MCV: 90.9 fL (ref 80.0–100.0)
Platelets: 291 10*3/uL (ref 150–400)
RBC: 3.08 MIL/uL — ABNORMAL LOW (ref 3.87–5.11)
RDW: 12.9 % (ref 11.5–15.5)
WBC: 5.9 10*3/uL (ref 4.0–10.5)
nRBC: 0 % (ref 0.0–0.2)

## 2018-10-17 LAB — POCT I-STAT 7, (LYTES, BLD GAS, ICA,H+H)
Bicarbonate: 24.4 mmol/L (ref 20.0–28.0)
Calcium, Ion: 1.22 mmol/L (ref 1.15–1.40)
HCT: 24 % — ABNORMAL LOW (ref 36.0–46.0)
Hemoglobin: 8.2 g/dL — ABNORMAL LOW (ref 12.0–15.0)
O2 Saturation: 100 %
Patient temperature: 35.3
Potassium: 4.2 mmol/L (ref 3.5–5.1)
Sodium: 141 mmol/L (ref 135–145)
TCO2: 26 mmol/L (ref 22–32)
pCO2 arterial: 36 mmHg (ref 32.0–48.0)
pH, Arterial: 7.431 (ref 7.350–7.450)
pO2, Arterial: 317 mmHg — ABNORMAL HIGH (ref 83.0–108.0)

## 2018-10-17 LAB — COMPREHENSIVE METABOLIC PANEL
ALT: 15 U/L (ref 0–44)
AST: 21 U/L (ref 15–41)
Albumin: 3.6 g/dL (ref 3.5–5.0)
Alkaline Phosphatase: 82 U/L (ref 38–126)
Anion gap: 8 (ref 5–15)
BUN: 22 mg/dL (ref 8–23)
CO2: 22 mmol/L (ref 22–32)
Calcium: 9 mg/dL (ref 8.9–10.3)
Chloride: 110 mmol/L (ref 98–111)
Creatinine, Ser: 0.93 mg/dL (ref 0.44–1.00)
GFR calc Af Amer: >60 mL/min (ref >60)
GFR calc non Af Amer: 54 mL/min — ABNORMAL LOW (ref >60)
Glucose, Bld: 137 mg/dL — ABNORMAL HIGH (ref 70–99)
Potassium: 3.9 mmol/L (ref 3.5–5.1)
Sodium: 140 mmol/L (ref 135–145)
Total Bilirubin: 0.5 mg/dL (ref 0.3–1.2)
Total Protein: 6 g/dL — ABNORMAL LOW (ref 6.5–8.1)

## 2018-10-17 LAB — ABO/RH: ABO/RH(D): O NEG

## 2018-10-17 LAB — PREPARE RBC (CROSSMATCH)

## 2018-10-17 SURGERY — ARTHROPLASTY, HIP, TOTAL, ANTERIOR APPROACH
Anesthesia: Spinal | Site: Hip | Laterality: Left

## 2018-10-17 MED ORDER — GABAPENTIN 100 MG PO CAPS
100.0000 mg | ORAL_CAPSULE | Freq: Three times a day (TID) | ORAL | Status: DC
  Administered 2018-10-17 – 2018-10-21 (×11): 100 mg via ORAL
  Filled 2018-10-17 (×11): qty 1

## 2018-10-17 MED ORDER — CARVEDILOL 6.25 MG PO TABS
6.2500 mg | ORAL_TABLET | Freq: Two times a day (BID) | ORAL | Status: DC
  Administered 2018-10-17 – 2018-10-21 (×8): 6.25 mg via ORAL
  Filled 2018-10-17 (×8): qty 1

## 2018-10-17 MED ORDER — METOCLOPRAMIDE HCL 5 MG PO TABS: 5.0000 mg | ORAL_TABLET | Freq: Three times a day (TID) | ORAL | Status: DC | PRN

## 2018-10-17 MED ORDER — ETOMIDATE 2 MG/ML IV SOLN
INTRAVENOUS | Status: AC
  Filled 2018-10-17: qty 10

## 2018-10-17 MED ORDER — PHENOL 1.4 % MT LIQD: 1.0000 | OROMUCOSAL | Status: DC | PRN

## 2018-10-17 MED ORDER — TRANEXAMIC ACID-NACL 1000-0.7 MG/100ML-% IV SOLN
1000.0000 mg | INTRAVENOUS | Status: AC
  Administered 2018-10-17: 10:00:00 1000 mg via INTRAVENOUS
  Filled 2018-10-17: qty 100

## 2018-10-17 MED ORDER — DOCUSATE SODIUM 100 MG PO CAPS
100.0000 mg | ORAL_CAPSULE | Freq: Two times a day (BID) | ORAL | Status: DC
  Administered 2018-10-17 – 2018-10-21 (×8): 100 mg via ORAL
  Filled 2018-10-17 (×7): qty 1

## 2018-10-17 MED ORDER — ONDANSETRON HCL 4 MG/2ML IJ SOLN
INTRAMUSCULAR | Status: AC
  Filled 2018-10-17: qty 2

## 2018-10-17 MED ORDER — DEXMEDETOMIDINE HCL IN NACL 200 MCG/50ML IV SOLN
INTRAVENOUS | Status: AC
  Filled 2018-10-17: qty 50

## 2018-10-17 MED ORDER — ONDANSETRON HCL 4 MG/2ML IJ SOLN: 4.0000 mg | Freq: Once | INTRAMUSCULAR | Status: DC | PRN

## 2018-10-17 MED ORDER — ONDANSETRON HCL 4 MG PO TABS
4.0000 mg | ORAL_TABLET | Freq: Four times a day (QID) | ORAL | Status: DC | PRN
  Administered 2018-10-18 – 2018-10-20 (×3): 4 mg via ORAL
  Filled 2018-10-17 (×4): qty 1

## 2018-10-17 MED ORDER — CHLORHEXIDINE GLUCONATE 4 % EX LIQD: 60.0000 mL | Freq: Once | CUTANEOUS | Status: DC

## 2018-10-17 MED ORDER — VANCOMYCIN HCL IN DEXTROSE 1-5 GM/200ML-% IV SOLN
1000.0000 mg | Freq: Two times a day (BID) | INTRAVENOUS | Status: AC
  Administered 2018-10-17: 1000 mg via INTRAVENOUS
  Filled 2018-10-17: qty 200

## 2018-10-17 MED ORDER — HYDROCODONE-ACETAMINOPHEN 5-325 MG PO TABS
1.0000 | ORAL_TABLET | ORAL | Status: DC | PRN
  Administered 2018-10-18: 11:00:00 0.5 via ORAL
  Filled 2018-10-17: qty 1

## 2018-10-17 MED ORDER — SODIUM CHLORIDE 0.9 % IV SOLN
INTRAVENOUS | Status: DC | PRN
  Administered 2018-10-17: 11:00:00 via INTRAVENOUS

## 2018-10-17 MED ORDER — SODIUM CHLORIDE 0.9 % IV SOLN
INTRAVENOUS | Status: DC
  Administered 2018-10-17: 16:00:00 via INTRAVENOUS

## 2018-10-17 MED ORDER — SUGAMMADEX SODIUM 200 MG/2ML IV SOLN
INTRAVENOUS | Status: DC | PRN
  Administered 2018-10-17: 150 mg via INTRAVENOUS

## 2018-10-17 MED ORDER — FENTANYL CITRATE (PF) 100 MCG/2ML IJ SOLN: 25.0000 ug | INTRAMUSCULAR | Status: DC | PRN

## 2018-10-17 MED ORDER — STERILE WATER FOR IRRIGATION IR SOLN
Status: DC | PRN
  Administered 2018-10-17: 2000 mL

## 2018-10-17 MED ORDER — CLOPIDOGREL BISULFATE 75 MG PO TABS
75.0000 mg | ORAL_TABLET | Freq: Every day | ORAL | Status: DC
  Administered 2018-10-18 – 2018-10-21 (×4): 75 mg via ORAL
  Filled 2018-10-17 (×4): qty 1

## 2018-10-17 MED ORDER — SODIUM CHLORIDE 0.9 % IV SOLN
INTRAVENOUS | Status: DC | PRN
  Administered 2018-10-17: 11:00:00 35 ug/min via INTRAVENOUS

## 2018-10-17 MED ORDER — METHOCARBAMOL 500 MG IVPB - SIMPLE MED
INTRAVENOUS | Status: AC
  Filled 2018-10-17: qty 50

## 2018-10-17 MED ORDER — ONDANSETRON HCL 4 MG/2ML IJ SOLN: 4.0000 mg | Freq: Four times a day (QID) | INTRAMUSCULAR | Status: DC | PRN

## 2018-10-17 MED ORDER — DEXMEDETOMIDINE HCL IN NACL 400 MCG/100ML IV SOLN
INTRAVENOUS | Status: DC | PRN
  Administered 2018-10-17 (×6): 4 ug via INTRAVENOUS
  Administered 2018-10-17: 2 ug via INTRAVENOUS
  Administered 2018-10-17: 8 ug via INTRAVENOUS
  Administered 2018-10-17: 4 ug via INTRAVENOUS

## 2018-10-17 MED ORDER — FENTANYL CITRATE (PF) 100 MCG/2ML IJ SOLN
INTRAMUSCULAR | Status: DC | PRN
  Administered 2018-10-17: 75 ug via INTRAVENOUS
  Administered 2018-10-17: 50 ug via INTRAVENOUS
  Administered 2018-10-17: 25 ug via INTRAVENOUS

## 2018-10-17 MED ORDER — MORPHINE SULFATE (PF) 2 MG/ML IV SOLN: 0.5000 mg | INTRAVENOUS | Status: DC | PRN

## 2018-10-17 MED ORDER — HYDROCODONE-ACETAMINOPHEN 7.5-325 MG PO TABS: 1.0000 | ORAL_TABLET | ORAL | Status: DC | PRN

## 2018-10-17 MED ORDER — CEFAZOLIN SODIUM-DEXTROSE 2-4 GM/100ML-% IV SOLN
2.0000 g | INTRAVENOUS | Status: DC
  Filled 2018-10-17: qty 100

## 2018-10-17 MED ORDER — SODIUM CHLORIDE 0.9 % IR SOLN
Status: DC | PRN
  Administered 2018-10-17: 1000 mL

## 2018-10-17 MED ORDER — METHOCARBAMOL 500 MG PO TABS
500.0000 mg | ORAL_TABLET | Freq: Four times a day (QID) | ORAL | Status: DC | PRN
  Administered 2018-10-18: 500 mg via ORAL
  Filled 2018-10-17: qty 1

## 2018-10-17 MED ORDER — LACTATED RINGERS IV SOLN
INTRAVENOUS | Status: DC
  Administered 2018-10-17: 08:00:00 via INTRAVENOUS

## 2018-10-17 MED ORDER — DEXAMETHASONE SODIUM PHOSPHATE 10 MG/ML IJ SOLN
INTRAMUSCULAR | Status: AC
  Filled 2018-10-17: qty 1

## 2018-10-17 MED ORDER — FENTANYL CITRATE (PF) 100 MCG/2ML IJ SOLN
INTRAMUSCULAR | Status: AC
  Filled 2018-10-17: qty 2

## 2018-10-17 MED ORDER — METHOCARBAMOL 500 MG IVPB - SIMPLE MED
500.0000 mg | Freq: Four times a day (QID) | INTRAVENOUS | Status: DC | PRN
  Administered 2018-10-17: 500 mg via INTRAVENOUS
  Filled 2018-10-17: qty 50

## 2018-10-17 MED ORDER — POVIDONE-IODINE 10 % EX SWAB
2.0000 "application " | Freq: Once | CUTANEOUS | Status: AC
  Administered 2018-10-17: 2 via TOPICAL

## 2018-10-17 MED ORDER — PROPOFOL 10 MG/ML IV BOLUS
INTRAVENOUS | Status: AC
  Filled 2018-10-17: qty 60

## 2018-10-17 MED ORDER — ACETAMINOPHEN 325 MG PO TABS
325.0000 mg | ORAL_TABLET | Freq: Four times a day (QID) | ORAL | Status: DC | PRN
  Administered 2018-10-17 – 2018-10-19 (×4): 650 mg via ORAL
  Administered 2018-10-20: 325 mg via ORAL
  Administered 2018-10-20: 15:00:00 650 mg via ORAL
  Administered 2018-10-20: 325 mg via ORAL
  Filled 2018-10-17: qty 2
  Filled 2018-10-17: qty 1
  Filled 2018-10-17 (×4): qty 2
  Filled 2018-10-17: qty 1

## 2018-10-17 MED ORDER — LIDOCAINE 2% (20 MG/ML) 5 ML SYRINGE
INTRAMUSCULAR | Status: AC
  Filled 2018-10-17: qty 5

## 2018-10-17 MED ORDER — LACTATED RINGERS IV SOLN
INTRAVENOUS | Status: DC
  Administered 2018-10-17: 10:00:00 via INTRAVENOUS

## 2018-10-17 MED ORDER — PHENYLEPHRINE HCL (PRESSORS) 10 MG/ML IV SOLN
INTRAVENOUS | Status: AC
  Filled 2018-10-17: qty 1

## 2018-10-17 MED ORDER — ROCURONIUM BROMIDE 10 MG/ML (PF) SYRINGE
PREFILLED_SYRINGE | INTRAVENOUS | Status: AC
  Filled 2018-10-17: qty 10

## 2018-10-17 MED ORDER — MIDAZOLAM HCL 2 MG/2ML IJ SOLN
INTRAMUSCULAR | Status: AC
  Filled 2018-10-17: qty 2

## 2018-10-17 MED ORDER — DIPHENHYDRAMINE HCL 12.5 MG/5ML PO ELIX: 12.5000 mg | ORAL_SOLUTION | ORAL | Status: DC | PRN

## 2018-10-17 MED ORDER — 0.9 % SODIUM CHLORIDE (POUR BTL) OPTIME
TOPICAL | Status: DC | PRN
  Administered 2018-10-17: 1000 mL

## 2018-10-17 MED ORDER — ALUM & MAG HYDROXIDE-SIMETH 200-200-20 MG/5ML PO SUSP: 30.0000 mL | ORAL | Status: DC | PRN

## 2018-10-17 MED ORDER — MENTHOL 3 MG MT LOZG: 1.0000 | LOZENGE | OROMUCOSAL | Status: DC | PRN

## 2018-10-17 MED ORDER — SODIUM CHLORIDE 0.9 % IV SOLN
10.0000 mL/h | Freq: Once | INTRAVENOUS | Status: AC
  Administered 2018-10-17: 10:00:00 via INTRAVENOUS

## 2018-10-17 MED ORDER — METOCLOPRAMIDE HCL 5 MG/ML IJ SOLN: 5.0000 mg | Freq: Three times a day (TID) | INTRAMUSCULAR | Status: DC | PRN

## 2018-10-17 MED ORDER — PANTOPRAZOLE SODIUM 40 MG PO TBEC
40.0000 mg | DELAYED_RELEASE_TABLET | Freq: Every day | ORAL | Status: DC
  Administered 2018-10-18 – 2018-10-21 (×4): 40 mg via ORAL
  Filled 2018-10-17 (×4): qty 1

## 2018-10-17 MED ORDER — ATORVASTATIN CALCIUM 20 MG PO TABS
20.0000 mg | ORAL_TABLET | Freq: Every day | ORAL | Status: DC
  Administered 2018-10-17 – 2018-10-20 (×4): 20 mg via ORAL
  Filled 2018-10-17 (×4): qty 1

## 2018-10-17 MED ORDER — POLYETHYLENE GLYCOL 3350 17 G PO PACK
17.0000 g | PACK | Freq: Every day | ORAL | Status: DC | PRN
  Administered 2018-10-18 – 2018-10-20 (×3): 17 g via ORAL
  Filled 2018-10-17 (×3): qty 1

## 2018-10-17 MED ORDER — MIDAZOLAM HCL 5 MG/5ML IJ SOLN
INTRAMUSCULAR | Status: DC | PRN
  Administered 2018-10-17: 0.5 mg via INTRAVENOUS

## 2018-10-17 MED ORDER — VANCOMYCIN HCL IN DEXTROSE 1-5 GM/200ML-% IV SOLN
1000.0000 mg | INTRAVENOUS | Status: AC
  Administered 2018-10-17: 1000 mg via INTRAVENOUS
  Filled 2018-10-17: qty 200

## 2018-10-17 SURGICAL SUPPLY — 40 items
APL SKNCLS STERI-STRIP NONHPOA (GAUZE/BANDAGES/DRESSINGS)
BAG SPEC THK2 15X12 ZIP CLS (MISCELLANEOUS)
BAG ZIPLOCK 12X15 (MISCELLANEOUS) IMPLANT
BENZOIN TINCTURE PRP APPL 2/3 (GAUZE/BANDAGES/DRESSINGS) IMPLANT
BLADE SAW SGTL 18X1.27X75 (BLADE) ×2 IMPLANT
BLADE SURG SZ10 CARB STEEL (BLADE) ×4 IMPLANT
COVER PERINEAL POST (MISCELLANEOUS) ×2 IMPLANT
COVER SURGICAL LIGHT HANDLE (MISCELLANEOUS) ×2 IMPLANT
COVER WAND RF STERILE (DRAPES) ×2 IMPLANT
CUP SECTOR GRIPTON 50MM (Cup) ×1 IMPLANT
DRAPE STERI IOBAN 125X83 (DRAPES) ×2 IMPLANT
DRAPE U-SHAPE 47X51 STRL (DRAPES) ×4 IMPLANT
DRSG AQUACEL AG ADV 3.5X10 (GAUZE/BANDAGES/DRESSINGS) ×2 IMPLANT
DURAPREP 26ML APPLICATOR (WOUND CARE) ×2 IMPLANT
ELECT REM PT RETURN 15FT ADLT (MISCELLANEOUS) ×2 IMPLANT
GAUZE XEROFORM 1X8 LF (GAUZE/BANDAGES/DRESSINGS) ×2 IMPLANT
GLOVE BIO SURGEON STRL SZ7.5 (GLOVE) ×2 IMPLANT
GLOVE BIOGEL PI IND STRL 8 (GLOVE) ×2 IMPLANT
GLOVE BIOGEL PI INDICATOR 8 (GLOVE) ×2
GLOVE ECLIPSE 8.0 STRL XLNG CF (GLOVE) ×2 IMPLANT
GOWN STRL REUS W/TWL XL LVL3 (GOWN DISPOSABLE) ×4 IMPLANT
HANDPIECE INTERPULSE COAX TIP (DISPOSABLE) ×2
HEAD FEM STD 32X+1 STRL (Hips) ×2 IMPLANT
HOLDER FOLEY CATH W/STRAP (MISCELLANEOUS) ×2 IMPLANT
KIT TURNOVER KIT A (KITS) IMPLANT
LINER ACETABULAR 32X50 (Liner) ×1 IMPLANT
PACK ANTERIOR HIP CUSTOM (KITS) ×2 IMPLANT
SET HNDPC FAN SPRY TIP SCT (DISPOSABLE) ×1 IMPLANT
STAPLER VISISTAT 35W (STAPLE) IMPLANT
STEM CORAIL KA13 (Stem) ×2 IMPLANT
STRIP CLOSURE SKIN 1/2X4 (GAUZE/BANDAGES/DRESSINGS) IMPLANT
SUT ETHIBOND NAB CT1 #1 30IN (SUTURE) ×2 IMPLANT
SUT ETHILON 2 0 PS N (SUTURE) IMPLANT
SUT MNCRL AB 4-0 PS2 18 (SUTURE) IMPLANT
SUT VIC AB 0 CT1 36 (SUTURE) ×2 IMPLANT
SUT VIC AB 1 CT1 36 (SUTURE) ×2 IMPLANT
SUT VIC AB 2-0 CT1 27 (SUTURE) ×4
SUT VIC AB 2-0 CT1 TAPERPNT 27 (SUTURE) ×2 IMPLANT
TRAY FOLEY MTR SLVR 14FR STAT (SET/KITS/TRAYS/PACK) ×2 IMPLANT
YANKAUER SUCT BULB TIP 10FT TU (MISCELLANEOUS) ×2 IMPLANT

## 2018-10-17 NOTE — Anesthesia Procedure Notes (Addendum)
Procedure Name: Intubation Date/Time: 10/17/2018 9:54 AM Performed by: Lissa Morales, CRNA Pre-anesthesia Checklist: Patient identified, Emergency Drugs available, Suction available and Patient being monitored Patient Re-evaluated:Patient Re-evaluated prior to induction Oxygen Delivery Method: Circle system utilized Preoxygenation: Pre-oxygenation with 100% oxygen Induction Type: IV induction and Cricoid Pressure applied Ventilation: Mask ventilation without difficulty and Mask ventilation with difficulty Laryngoscope Size: Mac, 4 and Glidescope Grade View: Grade III Tube type: Oral Number of attempts: 1 Airway Equipment and Method: Stylet and Oral airway Placement Confirmation: ETT inserted through vocal cords under direct vision,  positive ETCO2 and breath sounds checked- equal and bilateral Secured at: 21 cm Tube secured with: Tape Dental Injury: Teeth and Oropharynx as per pre-operative assessment and Injury to lip  Difficulty Due To: Difficult Airway- due to reduced neck mobility and Difficult Airway- due to limited oral opening Comments: Limited mobility of  Neck and oral openinig. Right upper lip with bruise. Attempt x1 with MAC 4 then changed to glidescope with good visualization

## 2018-10-17 NOTE — H&P (Signed)
TOTAL HIP ADMISSION H&P  Patient is admitted for left total hip arthroplasty.  Subjective:  Chief Complaint: left hip pain  HPI: Beth Johnston, 85 y.o. female, has a history of pain and functional disability in the left hip(s) due to arthritis and patient has failed non-surgical conservative treatments for greater than 12 weeks to include NSAID's and/or analgesics, corticosteriod injections, flexibility and strengthening excercises, supervised PT with diminished ADL's post treatment, use of assistive devices and activity modification.  Onset of symptoms was gradual starting 4 years ago with gradually worsening course since that time.The patient noted no past surgery on the left hip(s).  Patient currently rates pain in the left hip at 10 out of 10 with activity. Patient has night pain, worsening of pain with activity and weight bearing, trendelenberg gait, pain that interfers with activities of daily living, pain with passive range of motion and crepitus. Patient has evidence of subchondral cysts, subchondral sclerosis, periarticular osteophytes and joint space narrowing by imaging studies. This condition presents safety issues increasing the risk of falls.  There is no current active infection.  Patient Active Problem List   Diagnosis Date Noted  . Unilateral primary osteoarthritis, left hip 03/24/2018  . Chronic pain of both shoulders 02/27/2016  . Acute heart failure ([place])   . Elevated troponin   . Stented coronary artery   . Acute respiratory failure ([place]) 01/02/2014  . CAD S/P high risk LAD PCI/DES 12/30/13 01/02/2014  . Pseudoaneurysm following PCI 01/02/2014  . Blood loss anemia 01/02/2014  . Acute systolic CHF (congestive heart failure) ([place])   . NSTEMI (non-ST elevated myocardial infarction) ([place]) 12/26/2013  . Mitral regurgitation 12/26/2013  . Cardiomyopathy, ischemic-EF 15%-pre PCI 12/26/2013  . LBBB (left bundle branch block) 12/25/2013  . Asthma, persistent controlled  05/28/2013  . Upper airway cough syndrome 05/28/2013  . Cough 05/11/2013   Past Medical History:  Diagnosis Date  . Arthritis    "mostly my hands" (12/25/2013)  . Asthma   . Ischemic cardiomyopathy    a. EF 15%  . PONV (postoperative nausea and vomiting)   . Upper airway cough syndrome    Post-nasal drip    Past Surgical History:  Procedure Laterality Date  . APPENDECTOMY  1960's  . CARDIAC CATHETERIZATION  12/28/2013   Procedure: RIGHT/LEFT HEART CATH AND CORONARY ANGIOGRAPHY;  Surgeon: Troy Sine, MD;  Location: [facility] CATH LAB;  Service: Cardiovascular;;  . CYSTOCELE REPAIR  12/18/2011   Procedure: ANTERIOR REPAIR (CYSTOCELE);  Surgeon: Gus Height, MD;  Location: [facility];  Service: Gynecology;  Laterality: N/A;  . JOINT REPLACEMENT    . JOINT REPLACEMENT Right 1980's   "ring finger"  . PERCUTANEOUS CORONARY STENT INTERVENTION (PCI-S) N/A 12/30/2013   Procedure: PERCUTANEOUS CORONARY STENT INTERVENTION (PCI-S);  Surgeon: Burnell Blanks, MD;  Location: [facility] CATH LAB;  Service: Cardiovascular;  Laterality: N/A;  . REPLACEMENT TOTAL KNEE BILATERAL Bilateral 2009    No current facility-administered medications for this encounter.    Allergies  Allergen Reactions  . Codeine     Nauseated  . Lisinopril Cough  . Tramadol     Confusion    Social History   Tobacco Use  . Smoking status: Never Smoker  . Smokeless tobacco: Never Used  Substance Use Topics  . Alcohol use: No    Family History  Problem Relation Age of Onset  . Cerebral aneurysm Mother        died at age 57  . Healthy Father  died of old age     Review of Systems  Musculoskeletal: Positive for joint pain.  All other systems reviewed and are negative.   Objective:  Physical Exam  Constitutional: She is oriented to person, place, and time. She appears well-developed and well-nourished.  HENT:  Head: Normocephalic and atraumatic.  Eyes: Pupils are equal, round, and reactive to light. EOM  are normal.  Neck: Normal range of motion. Neck supple.  Cardiovascular: Normal rate.  Respiratory: Effort normal.  GI: Soft.  Musculoskeletal:     Left hip: She exhibits decreased range of motion, decreased strength, tenderness and bony tenderness.  Neurological: She is alert and oriented to person, place, and time.  Skin: Skin is warm and dry.  Psychiatric: She has a normal mood and affect.    Vital signs in last 24 hours:    Labs:   Estimated body mass index is 24.63 kg/m as calculated from the following:   Height as of 09/23/18: 5\' 5"  (1.651 m).   Weight as of 09/23/18: 67.1 kg.   Imaging Review Plain radiographs demonstrate severe degenerative joint disease of the left hip(s). The bone quality appears to be fair for age and reported activity level.      Assessment/Plan:  End stage arthritis, left hip(s)  The patient history, physical examination, clinical judgement of the provider and imaging studies are consistent with end stage degenerative joint disease of the left hip(s) and total hip arthroplasty is deemed medically necessary. The treatment options including medical management, injection therapy, arthroscopy and arthroplasty were discussed at length. The risks and benefits of total hip arthroplasty were presented and reviewed. The risks due to aseptic loosening, infection, stiffness, dislocation/subluxation,  thromboembolic complications and other imponderables were discussed.  The patient acknowledged the explanation, agreed to proceed with the plan and consent was signed. Patient is being admitted for inpatient treatment for surgery, pain control, PT, OT, prophylactic antibiotics, VTE prophylaxis, progressive ambulation and ADL's and discharge planning.The patient is planning to be discharged home with home health services vs skilled nursing depending on her progress.

## 2018-10-17 NOTE — Evaluation (Signed)
Physical Therapy Evaluation Patient Details Name: Beth Johnston MRN: [id number] DOB: 08-07-1925 Today's Date: 10/17/2018   History of Present Illness  Pt s/p L THR and with hx of CAD, STEMI, LBBB, ischemic cardiomyopathy and bil TKR  Clinical Impression  Pt s/p L THR and presents with decreased L LE strength/ROM, post op pain, and premorbid deconditioning limiting functional mobility.  Pt hopes to progress to dc home with assist of family and follow up HHPT.    Follow Up Recommendations Follow surgeon's recommendation for DC plan and follow-up therapies    Equipment Recommendations  Rolling walker with 5" wheels    Recommendations for Other Services OT consult     Precautions / Restrictions Precautions Precautions: Fall Restrictions Weight Bearing Restrictions: No Other Position/Activity Restrictions: WBAT      Mobility  Bed Mobility Overal bed mobility: Needs Assistance Bed Mobility: Supine to Sit;Sit to Supine     Supine to sit: Mod assist;Max assist;+2 for physical assistance;+2 for safety/equipment Sit to supine: Mod assist;Max assist;+2 for physical assistance;+2 for safety/equipment   General bed mobility comments: INcreased time   Transfers Overall transfer level: Needs assistance Equipment used: Rolling walker (2 wheeled) Transfers: Sit to/from Stand Sit to Stand: Mod assist;+2 physical assistance;+2 safety/equipment;From elevated surface         General transfer comment: cues for LE management and use of UEs to self assist  Ambulation/Gait Ambulation/Gait assistance: Mod assist;+2 physical assistance;+2 safety/equipment Gait Distance (Feet): 1 Feet Assistive device: Rolling walker (2 wheeled) Gait Pattern/deviations: Step-to pattern;Decreased step length - right;Decreased step length - left;Shuffle;Trunk flexed     General Gait Details: Pt stood at side of bed x 2 minutes and able to take one step bkwd to EOB to sit  Stairs             Wheelchair Mobility    Modified Rankin (Stroke Patients Only)       Balance Overall balance assessment: Needs assistance Sitting-balance support: No upper extremity supported;Feet supported Sitting balance-Leahy Scale: Fair     Standing balance support: Bilateral upper extremity supported Standing balance-Leahy Scale: Good                               Pertinent Vitals/Pain Pain Assessment: Faces Faces Pain Scale: Hurts even more Pain Location: L hip Pain Descriptors / Indicators: Aching;Sore Pain Intervention(s): Limited activity within patient's tolerance;Monitored during session;Premedicated before session;Ice applied    Home Living Family/patient expects to be discharged to:: Private residence Living Arrangements: Children Available Help at Discharge: Family Type of Home: House Home Access: Stairs to enter Entrance Stairs-Rails: Right;Left;Can reach both Technical brewer of Steps: 3 Home Layout: One level Home Equipment: None      Prior Function Level of Independence: Needs assistance   Gait / Transfers Assistance Needed: sliding bar stool around or leaning on dtr  ADL's / Homemaking Assistance Needed: assist of dtr        Hand Dominance        Extremity/Trunk Assessment   Upper Extremity Assessment Upper Extremity Assessment: RUE deficits/detail;LUE deficits/detail RUE Deficits / Details: Shoulders very limited 2* rotator cuff damage LUE Deficits / Details: Shoulders very limited 2* rotator cuff damage    Lower Extremity Assessment Lower Extremity Assessment: LLE deficits/detail    Cervical / Trunk Assessment Cervical / Trunk Assessment: Kyphotic  Communication   Communication: No difficulties  Cognition Arousal/Alertness: Awake/alert Behavior During Therapy: WFL for tasks assessed/performed Overall Cognitive Status:  Within Functional Limits for tasks assessed                                         General Comments      Exercises Total Joint Exercises Ankle Circles/Pumps: AROM;Both;15 reps;Supine   Assessment/Plan    PT Assessment Patient needs continued PT services  PT Problem List Decreased strength;Decreased range of motion;Decreased activity tolerance;Decreased balance;Decreased mobility;Decreased knowledge of use of DME;Pain       PT Treatment Interventions DME instruction;Gait training;Stair training;Functional mobility training;Therapeutic activities;Therapeutic exercise;Patient/family education    PT Goals (Current goals can be found in the Care Plan section)  Acute Rehab PT Goals Patient Stated Goal: Walk with less pain PT Goal Formulation: With patient Time For Goal Achievement: 10/24/18 Potential to Achieve Goals: Fair    Frequency 7X/week   Barriers to discharge        Co-evaluation               AM-PAC PT "6 Clicks" Mobility  Outcome Measure Help needed turning from your back to your side while in a flat bed without using bedrails?: A Lot Help needed moving from lying on your back to sitting on the side of a flat bed without using bedrails?: A Lot Help needed moving to and from a bed to a chair (including a wheelchair)?: A Lot Help needed standing up from a chair using your arms (e.g., wheelchair or bedside chair)?: A Lot Help needed to walk in hospital room?: A Lot Help needed climbing 3-5 steps with a railing? : Total 6 Click Score: 11    End of Session Equipment Utilized During Treatment: Gait belt Activity Tolerance: Patient limited by fatigue;Patient limited by pain Patient left: in bed;with call bell/phone within reach;with bed alarm set;with family/visitor present Nurse Communication: Mobility status PT Visit Diagnosis: Difficulty in walking, not elsewhere classified (R26.2);Muscle weakness (generalized) (M62.81);Pain Pain - Right/Left: Left Pain - part of body: Hip    Time: [id number] PT Time Calculation (min) (ACUTE ONLY): 28  min   Charges:   PT Evaluation $PT Eval Low Complexity: 1 Low PT Treatments $Therapeutic Activity: 8-22 mins        Debe Coder PT Acute Rehabilitation Services Pager [phone] Office [phone]   [name] 10/17/2018, 6:24 PM

## 2018-10-17 NOTE — Anesthesia Postprocedure Evaluation (Signed)
Anesthesia Post Note  Patient: Beth Johnston  Procedure(s) Performed: LEFT TOTAL HIP ARTHROPLASTY ANTERIOR APPROACH (Left Hip)     Patient location during evaluation: PACU Anesthesia Type: General Level of consciousness: awake and alert Pain management: pain level controlled Vital Signs Assessment: post-procedure vital signs reviewed and stable Respiratory status: spontaneous breathing, nonlabored ventilation, respiratory function stable and patient connected to nasal cannula oxygen Cardiovascular status: blood pressure returned to baseline and stable Postop Assessment: no apparent nausea or vomiting Anesthetic complications: no    Last Vitals:  Vitals:   10/17/18 1402 10/17/18 1500  BP: (!) 150/63 (!) 137/57  Pulse: (!) 58 61  Resp: 16 16  Temp: 36.4 C   SpO2: 100% 100%    Last Pain:  Vitals:   10/17/18 1421  TempSrc:   PainSc: 3                  Jaishon Krisher COKER

## 2018-10-17 NOTE — Transfer of Care (Signed)
Immediate Anesthesia Transfer of Care Note  Patient: Beth Johnston  Procedure(s) Performed: LEFT TOTAL HIP ARTHROPLASTY ANTERIOR APPROACH (Left Hip)  Patient Location: PACU  Anesthesia Type:General  Level of Consciousness: awake, sedated and patient cooperative  Airway & Oxygen Therapy: Patient Spontanous Breathing and Patient connected to face mask oxygen  Post-op Assessment: Report given to RN, Post -op Vital signs reviewed and stable and Patient moving all extremities X 4  Post vital signs: stable  Last Vitals:  Vitals Value Taken Time  BP 120/58 10/17/18 1145  Temp 36.4 C 10/17/18 1140  Pulse 55 10/17/18 1152  Resp 15 10/17/18 1152  SpO2 100 % 10/17/18 1152  Vitals shown include unvalidated device data.  Last Pain:  Vitals:   10/17/18 1140  TempSrc:   PainSc: 0-No pain      Patients Stated Pain Goal: 3 ([id number] [phone])  Complications: No apparent anesthesia complications

## 2018-10-17 NOTE — Op Note (Signed)
NAME: Beth Johnston, Beth Johnston MEDICAL RECORD [id number] ACCOUNT [id number] DATE OF BIRTH:[birth year] FACILITY: [facility] LOCATION: [facility] PHYSICIAN:Tiajuana Leppanen [place], MD  OPERATIVE REPORT  DATE OF PROCEDURE:  10/17/2018  PREOPERATIVE DIAGNOSIS:  Primary osteoarthritis and degenerative joint disease, left hip.  POSTOPERATIVE DIAGNOSIS:  Primary osteoarthritis and degenerative joint disease, left hip.  PROCEDURE:  Left total hip arthroplasty through direct anterior approach.  IMPLANTS:  DePuy Sector Gription acetabular component size 50, size 32+0 neutral polyethylene liner, size 13 Corail femoral component with standard offset, size 32+1 metal hip ball.  SURGEON:  Lind Guest. Ninfa Linden, MD  ASSISTANT:  Erskine Emery, PA-C  ANESTHESIA:  General.  ANTIBIOTICS:  One gram IV vancomycin.  FLUIDS:  One unit packed red blood cells given the patient's significantly chronic anemia preoperative.  COMPLICATIONS:  None.  INDICATIONS:  The patient is a 85 year old female with debilitating end-stage arthritis involving her left hip.  I have seen her for some period of time for this and it has gotten to where it is detrimentally affecting her mobility, her quality of life  and activities of daily living.  She has tried and failed all forms of conservative treatment.  Her pain has gotten to be so severe as she is a fall risk and this has become quite a quality of life issue for her.  She does have arthritic knees and  arthritic shoulders, but her hip has been what is hurting her the most.  At this point, having tried and failed all forms of conservative treatment, she does wish to proceed with a total hip arthroplasty on the left side.  With that being said, she  understands the heightened risk of acute blood loss anemia, nerve or vessel injury, fracture, infection, dislocation, DVT as well as implant failure.  She understands our goals are to decrease pain, improve mobility and overall improve  quality of life.  DESCRIPTION OF PROCEDURE:  After informed consent was obtained and appropriate left hip was marked, she was brought to the operating room where general anesthesia was obtained while she was on a stretcher.  Foley catheter was placed and traction boots  were placed on both her feet.  Next, she was placed supine on the Hana fracture table, the perineal post in place and both legs in line skeletal traction device and no traction applied.  Her left operative hip was prepped and draped with DuraPrep and  sterile drapes.  A time-out was called.  She was identified, correct patient, correct left hip.  We then made an incision just inferior and posterior to the anterior superior iliac spine and carried this obliquely down the leg.  We dissected down tensor  fascia lata muscle.  Tensor fascia was then divided longitudinally to proceed with direct anterior approach to the hip.  We identified and cauterized circumflex vessels and identified the hip capsule, opened the hip capsule in an L-type format finding a  large joint effusion and significant osteophytes around the femoral head and neck.  We placed curved retractors within the joint capsule around the medial and lateral femoral neck.  We made our femoral neck cut with an oscillating saw and completed this  with an osteotome.  We placed a corkscrew guide in the femoral head and removed the femoral head in its entirety and found a wide area devoid of cartilage with flattening of the femoral head.  We then placed a bent Hohmann over the medial acetabular rim  and removed remnants of the acetabular labrum and other debris and then  began reaming in stepwise increments up to a size 49 reamer.  All reamers were placed under direct visualization, the last reamer was also placed under direct fluoroscopy, so we  could obtain our depth of reaming, our inclination and anteversion.  I then placed the real DePuy Sector Gription acetabular component size 50 and  a 32+0 neutral polyethylene liner for that size acetabular component.  Attention was then turned to the  femur.  With the leg externally rotated to 120 degrees, extended and adducted, we were able to place a Mueller retractor medially and Hohman retractor behind the greater trochanter, released lateral joint capsule and used a box-cutting osteotome to enter  the femoral canal and a rongeur to lateralize then began broaching using the Corail broaching system from a size 8 up to size 13.  With the 13 in place, we trialed a standard offset femoral neck and a 32+1 hip ball, reduced this in the acetabulum.  We  were pleased with the leg length, offset, range of motion and stability.  We dislocated the hip and removed the trial components.  We then placed the real Corail femoral component size 13 with standard offset and the real 32+1 metal hip ball and again  reduced this in the acetabulum.  We were pleased with stability, range of motion and offset.  This was based clinically and radiographically.  We then irrigated the soft tissue with normal saline solution using pulsatile lavage.  We were able to close  the joint capsule with interrupted #1 Ethibond suture.  We reapproximated the tensor fascia with #1 Vicryl suture followed by 0 Vicryl to close the deep tissue, 2-0 Vicryl was used to close subcutaneous tissue and interrupted staples were placed on the  skin.  Xeroform and Aquacel dressing was applied.  She was taken off the Hana table, awakened, extubated, and taken to recovery room in stable condition.  All final counts were correct.  There were no complications noted.  Of note, Benita Stabile, PA-C,  assisted in the entire case.  His assistance was crucial for facilitating all aspects of this case.  TN/NUANCE  D:10/17/2018 T:10/17/2018 JOB:[id number]

## 2018-10-17 NOTE — Brief Op Note (Signed)
10/17/2018  11:14 AM  PATIENT:  Beth Johnston  85 y.o. female  PRE-OPERATIVE DIAGNOSIS:  severe left hip osteoarthritis  POST-OPERATIVE DIAGNOSIS:  severe left hip osteoarthritis  PROCEDURE:  Procedure(s): LEFT TOTAL HIP ARTHROPLASTY ANTERIOR APPROACH (Left)  SURGEON:  Surgeon(s) and Role:    Mcarthur Rossetti, MD - Primary  PHYSICIAN ASSISTANT: Benita Stabile, PA-C  ANESTHESIA:   general  EBL:  300 mL   COUNTS:  YES  DICTATION: .Other Dictation: Dictation Number [id number]  PLAN OF CARE: Admit to inpatient   PATIENT DISPOSITION:  PACU - hemodynamically stable.   Delay start of Pharmacological VTE agent (>24hrs) due to surgical blood loss or risk of bleeding: no

## 2018-10-17 NOTE — Anesthesia Preprocedure Evaluation (Signed)
Anesthesia Evaluation  Patient identified by MRN, date of birth, ID band Patient awake    Reviewed: Allergy & Precautions, NPO status , Unable to perform ROS - Chart review only  Airway Mallampati: II  TM Distance: >3 FB Neck ROM: Full    Dental  (+) Teeth Intact, Dental Advisory Given   Pulmonary    breath sounds clear to auscultation       Cardiovascular  Rhythm:Regular Rate:Normal + Systolic murmurs    Neuro/Psych    GI/Hepatic   Endo/Other    Renal/GU      Musculoskeletal   Abdominal   Peds  Hematology   Anesthesia Other Findings   Reproductive/Obstetrics                             Anesthesia Physical Anesthesia Plan  ASA: III  Anesthesia Plan: General   Post-op Pain Management:    Induction: Intravenous  PONV Risk Score and Plan: Ondansetron and Dexamethasone  Airway Management Planned: Oral ETT  Additional Equipment: Arterial line  Intra-op Plan:   Post-operative Plan: Extubation in OR  Informed Consent: I have reviewed the patients History and Physical, chart, labs and discussed the procedure including the risks, benefits and alternatives for the proposed anesthesia with the patient or authorized representative who has indicated his/her understanding and acceptance.     Dental advisory given  Plan Discussed with: CRNA and Anesthesiologist  Anesthesia Plan Comments:         Anesthesia Quick Evaluation

## 2018-10-17 NOTE — Care Plan (Signed)
Ortho Bundle Case Management Note  Patient Details  Name: Beth Johnston MRN: [id number] Date of Birth: 01-19-1926   RNCM spoke with patient and her daughter prior to surgery. Her daughter will be available for assistance at discharge. Anticipate HHPT with [facility]. Liaison is aware. Choice provided. Also noted that patient will need evaluation after surgery to see what DME will be needed as she has issues with bilateral shoulders and family doesn't feel she will be able to use a walker. Defer recommendations to floor therapist. Will notify CM that DME will have to be then ordered before discharge if over the weekend. Will continue to follow for CM needs.                  DME Arranged:  (Patient requested a 3in1; Will wait to see what other DME will be needed per PT-bilateral issues with shoulders, so may need platform walker) DME Agency:     HH Arranged:  PT [place]:  [facility]  Additional Comments: Please contact me with any questions of if this plan should need to change.  Jamse Arn, RN, BSN, [employer]  [phone] 10/17/2018, 2:11 PM

## 2018-10-18 LAB — CBC
HCT: 28.5 % — ABNORMAL LOW (ref 36.0–46.0)
Hemoglobin: 8.8 g/dL — ABNORMAL LOW (ref 12.0–15.0)
MCH: 27.5 pg (ref 26.0–34.0)
MCHC: 30.9 g/dL (ref 30.0–36.0)
MCV: 89.1 fL (ref 80.0–100.0)
Platelets: 192 10*3/uL (ref 150–400)
RBC: 3.2 MIL/uL — ABNORMAL LOW (ref 3.87–5.11)
RDW: 13.9 % (ref 11.5–15.5)
WBC: 5.8 10*3/uL (ref 4.0–10.5)
nRBC: 0 % (ref 0.0–0.2)

## 2018-10-18 LAB — BASIC METABOLIC PANEL
Anion gap: 6 (ref 5–15)
BUN: 14 mg/dL (ref 8–23)
CO2: 23 mmol/L (ref 22–32)
Calcium: 8.4 mg/dL — ABNORMAL LOW (ref 8.9–10.3)
Chloride: 107 mmol/L (ref 98–111)
Creatinine, Ser: 0.78 mg/dL (ref 0.44–1.00)
GFR calc Af Amer: >60 mL/min (ref >60)
GFR calc non Af Amer: >60 mL/min (ref >60)
Glucose, Bld: 102 mg/dL — ABNORMAL HIGH (ref 70–99)
Potassium: 3.9 mmol/L (ref 3.5–5.1)
Sodium: 136 mmol/L (ref 135–145)

## 2018-10-18 MED ORDER — TRAMADOL HCL 50 MG PO TABS: 50.0000 mg | ORAL_TABLET | Freq: Four times a day (QID) | ORAL | Status: DC | PRN

## 2018-10-18 MED ORDER — KETOROLAC TROMETHAMINE 15 MG/ML IJ SOLN
7.5000 mg | Freq: Four times a day (QID) | INTRAMUSCULAR | Status: AC
  Administered 2018-10-18 (×2): 7.5 mg via INTRAVENOUS
  Filled 2018-10-18 (×2): qty 1

## 2018-10-18 MED ORDER — FUROSEMIDE 40 MG PO TABS: 40.0000 mg | ORAL_TABLET | Freq: Every day | ORAL | Status: DC | PRN

## 2018-10-18 MED ORDER — BISACODYL 5 MG PO TBEC
10.0000 mg | DELAYED_RELEASE_TABLET | Freq: Every day | ORAL | Status: DC | PRN
  Administered 2018-10-19 – 2018-10-20 (×2): 10 mg via ORAL
  Filled 2018-10-18 (×3): qty 2

## 2018-10-18 NOTE — Progress Notes (Signed)
Subjective: 1 Day Post-Op Procedure(s) (LRB): LEFT TOTAL HIP ARTHROPLASTY ANTERIOR APPROACH (Left) Patient reports pain as moderate.  Reports difficulty getting up from bed to chair this AM. Otherwise no complaints.   Objective: Vital signs in last 24 hours: Temp:  [97.5 F (36.4 C)-98.9 F (37.2 C)] 98.6 F (37 C) (09/19 0439) Pulse Rate:  [53-74] 71 (09/19 0759) Resp:  [15-20] 16 (09/19 0439) BP: (114-150)/(55-66) 130/55 (09/19 0759) SpO2:  [98 %-100 %] 100 % (09/19 0759) Arterial Line BP: (130-141)/(50-56) 141/56 (09/18 1245)  Intake/Output from previous day: 09/18 0701 - 09/19 0700 In: 4226 [P.O.:880; I.V.:2431; Blood:665; IV Piggyback:250] Out: [id number] [Urine:1525; Blood:300] Intake/Output this shift: No intake/output data recorded.  Recent Labs    10/17/18 0741 10/17/18 1108 10/18/18 0222  HGB 8.4* 8.2* 8.8*   Recent Labs    10/17/18 0741 10/17/18 1108 10/18/18 0222  WBC 5.9  --  5.8  RBC 3.08*  --  3.20*  HCT 28.0* 24.0* 28.5*  PLT 291  --  192   Recent Labs    10/17/18 0741 10/17/18 1108 10/18/18 0222  NA 140 141 136  K 3.9 4.2 3.9  CL 110  --  107  CO2 22  --  23  BUN 22  --  14  CREATININE 0.93  --  0.78  GLUCOSE 137*  --  102*  CALCIUM 9.0  --  8.4*   No results for input(s): LABPT, INR in the last 72 hours.  Left lower extremity: Sensation intact distally Dorsiflexion/Plantar flexion intact Incision: scant drainage Compartment soft   Assessment/Plan: 1 Day Post-Op Procedure(s) (LRB): LEFT TOTAL HIP ARTHROPLASTY ANTERIOR APPROACH (Left) Up with therapy Monitor acute blood loss anemia  on chronic anemia for symptoms. Will check Hgb/HCT in AM.       GILBERT CLARK 10/18/2018, 8:44 AM

## 2018-10-18 NOTE — TOC Initial Note (Signed)
Transition of Care [facility]) - Initial/Assessment Note    Patient Details  Name: Beth Johnston MRN: [id number] Date of Birth: 1925-02-15  Transition of Care (TOC) CM/SW Contact:    Joaquin Courts, RN Phone Number: 10/18/2018, 11:09 AM  Clinical Narrative:        CM spoke with patient at bedside. Patient set up with [facility] for [place]. [employer] to deliver rolling walker and 3-in-1 to bedside for home use.            Expected Discharge Plan: [place] Barriers to Discharge: Continued Medical Work up   Patient Goals and CMS Choice   CMS Medicare.gov Compare Post Acute Care list provided to:: Patient Choice offered to / list presented to : Patient  Expected Discharge Plan and Services Expected Discharge Plan: [place]   Discharge Planning Services: CM Consult Post Acute Care Choice: [place] arrangements for the past 2 months: Single Family Home                 DME Arranged: 3-N-1, Walker rolling DME Agency: [employer] Date DME Agency Contacted: 10/18/18 Time DME Agency Contacted: [phone] Representative spoke with at DME Agency: [place]: PT [place]: [facility] [employer] [facility] [employer]     Representative spoke with at [place]: pre arranged in MD office  Prior Living Arrangements/Services Living arrangements for the past 2 months: [place] with:: Adult Children Patient language and need for interpreter reviewed:: Yes Do you feel safe going back to the place where you live?: Yes      Need for Family Participation in Patient Care: Yes (Comment) Care giver support system in place?: Yes (comment)   Criminal Activity/Legal Involvement Pertinent to Current Situation/Hospitalization: No - Comment as needed  Activities of Daily Living Home Assistive Devices/Equipment: Eyeglasses, Built-in shower seat, Shower chair with back ADL Screening (condition at time of admission) Patient's  cognitive ability adequate to safely complete daily activities?: Yes Is the patient deaf or have difficulty hearing?: No Does the patient have difficulty seeing, even when wearing glasses/contacts?: No Does the patient have difficulty concentrating, remembering, or making decisions?: No Patient able to express need for assistance with ADLs?: Yes Does the patient have difficulty dressing or bathing?: No Independently performs ADLs?: Yes (appropriate for developmental age) Does the patient have difficulty walking or climbing stairs?: Yes Weakness of Legs: Left Weakness of Arms/Hands: None  Permission Sought/Granted                  Emotional Assessment Appearance:: Appears stated age Attitude/Demeanor/Rapport: Engaged Affect (typically observed): Accepting Orientation: : Oriented to  Time, Oriented to Situation, Oriented to Place, Oriented to Self   Psych Involvement: No (comment)  Admission diagnosis:  severe left hip osteoarthritis Patient Active Problem List   Diagnosis Date Noted  . Status post total replacement of left hip 10/17/2018  . Unilateral primary osteoarthritis, left hip 03/24/2018  . Chronic pain of both shoulders 02/27/2016  . Acute heart failure ([place])   . Elevated troponin   . Stented coronary artery   . Acute respiratory failure ([place]) 01/02/2014  . CAD S/P high risk LAD PCI/DES 12/30/13 01/02/2014  . Pseudoaneurysm following PCI 01/02/2014  . Blood loss anemia 01/02/2014  . Acute systolic CHF (congestive heart failure) ([place])   . NSTEMI (non-ST elevated myocardial infarction) ([place]) 12/26/2013  . Mitral regurgitation 12/26/2013  . Cardiomyopathy, ischemic-EF 15%-pre PCI 12/26/2013  . LBBB (  left bundle branch block) 12/25/2013  . Asthma, persistent controlled 05/28/2013  . Upper airway cough syndrome 05/28/2013  . Cough 05/11/2013   PCP:  Patient, No Pcp Per Pharmacy:   [id number] - [place], [place] [place] [address] Phone: [phone] Fax: [phone]  [facility], [place] - [address] [place] [address] Phone: [phone] Fax: [place], [place] [facility] [address] [place] [address] [place] [address] Phone: [phone] Fax: [phone]     Social Determinants of Health (SDOH) Interventions    Readmission Risk Interventions No flowsheet data found.

## 2018-10-18 NOTE — Progress Notes (Signed)
    Home health agencies that serve [phone].        [place] Quality of Patient Care Rating Patient Survey Summary Rating  [facility] [phone] 3 out of 5 stars 4 out of [place] [phone] 4 out of 5 stars 4 out of [place] [phone] 4 out of 5 stars 4 out of 5 stars  [place] [phone] 4 out of 5 stars 4 out of [place] [phone] 4 out of 5 stars 4 out of [place] [phone] 4 out of 5 stars 4 out of [place] [phone] 3 out of 5 stars 4 out of 5 stars  [facility] [phone]) [phone] 4 out of 5 stars Not [place] [phone] 3 out of 5 stars 4 out of 5 stars  [facility] [phone] 3  out of 5 stars 3 out of [place] [phone] 4  out of 5 stars 3 out of [place] [phone] 4 out of 5 stars 5 out of [place] [phone] 4  out of 5 stars 3 out of [place] number Footnote as displayed on [place]  1 This agency provides services under a federal waiver program to non-traditional, chronic long term population.  2 This agency provides services to a special needs population.  3 Not Available.  4 The number of patient episodes for this measure is too small to report.  5 This measure currently does not have data or provider has been certified/recertified for less than 6 months.  6 The national average for this measure is not provided because of state-to-state differences in data collection.  7 Medicare is not displaying rates for this measure for any home health agency, because of an issue with the data.  8 There were problems  with the data and they are being corrected.  9 Zero, or very few, patients met the survey's rules for inclusion. The scores shown, if any, reflect a very small number of surveys and may not accurately tell how an agency is doing.  10 Survey results are based on less than 12 months of data.  11 Fewer than 70 patients completed the survey. Use the scores shown, if any, with caution as the number of surveys may be too low to accurately tell how an agency is doing.  12 No survey results are available for this period.  13 Data suppressed by CMS for one or more quarters.

## 2018-10-18 NOTE — Evaluation (Signed)
Occupational Therapy Evaluation Patient Details Name: Beth Johnston MRN: [id number] DOB: January 29, 1926 Today's Date: 10/18/2018    History of Present Illness Pt s/p L THR and with hx of CAD, STEMI, LBBB, ischemic cardiomyopathy and bil TKR   Clinical Impression   This 85 year old female was admitted for the above sx.  Family will assist with adls.  Pt has a high commode with riser and one grab bar; she would benefit from 3:1 over commode to have bil UE surfaces to push up from.  Educated on shower transfer sequence; she may need to sponge bathe initially due to pain.  Will follow in acute setting focusing on toileting and sit to stand for adls.    Follow Up Recommendations  Follow surgeon's recommendation for DC plan and follow-up therapies;Supervision/Assistance - 24 hour    Equipment Recommendations  3 in 1 bedside commode    Recommendations for Other Services       Precautions / Restrictions Precautions Precautions: Fall Restrictions Other Position/Activity Restrictions: WBAT      Mobility Bed Mobility               General bed mobility comments: in recliner  Transfers   Equipment used: Rolling walker (2 wheeled)   Sit to Stand: Mod assist;+2 physical assistance;+2 safety/equipment;From elevated surface         General transfer comment: cues for LE management and use of UEs to self assist    Balance                                           ADL either performed or assessed with clinical judgement   ADL Overall ADL's : Needs assistance/impaired Eating/Feeding: Set up   Grooming: Minimal assistance   Upper Body Bathing: Minimal assistance   Lower Body Bathing: Maximal assistance;Sit to/from stand;+2 for physical assistance   Upper Body Dressing : Moderate assistance   Lower Body Dressing: Total assistance;Sit to/from stand;+2 for physical assistance   Toilet Transfer: Moderate assistance;+2 for physical  assistance;RW;Ambulation(chair)   Toileting- Clothing Manipulation and Hygiene: Moderate assistance;+2 for physical assistance;Sit to/from stand         General ADL Comments: daughter present. Recommended 3;1 for over toilet at home and potentially in shower--she states seat is too big.  Daughter will assist as needed for adls     Vision         Perception     Praxis      Pertinent Vitals/Pain Faces Pain Scale: Hurts even more Pain Location: L hip Pain Descriptors / Indicators: Aching;Sore Pain Intervention(s): Limited activity within patient's tolerance;Monitored during session;Premedicated before session;Repositioned     Hand Dominance     Extremity/Trunk Assessment Upper Extremity Assessment RUE Deficits / Details: Shoulders very limited 2* rotator cuff damage LUE Deficits / Details: Shoulders very limited 2* rotator cuff damage           Communication Communication Communication: No difficulties   Cognition Arousal/Alertness: Awake/alert Behavior During Therapy: WFL for tasks assessed/performed Overall Cognitive Status: Within Functional Limits for tasks assessed                                     General Comments       Exercises     Shoulder Instructions      Home Living  Family/patient expects to be discharged to:: Private residence Living Arrangements: Children Available Help at Discharge: Family               Bathroom Shower/Tub: Walk-in Corporate treasurer Toilet: Handicapped height     Home Equipment: Toilet riser;Shower seat;Grab bars - toilet          Prior Functioning/Environment          Comments: pt states she could bathe and dress herself prior to sx        OT Problem List: Decreased strength;Decreased activity tolerance;Pain;Decreased knowledge of use of DME or AE;Decreased range of motion;Impaired UE functional use      OT Treatment/Interventions: Self-care/ADL training;DME and/or AE  instruction;Patient/family education;Balance training;Therapeutic activities;Energy conservation    OT Goals(Current goals can be found in the care plan section) Acute Rehab OT Goals Patient Stated Goal: Walk with less pain OT Goal Formulation: With patient Time For Goal Achievement: 11/01/18 Potential to Achieve Goals: Good ADL Goals Pt Will Transfer to Toilet: with min assist;bedside commode;ambulating;stand pivot transfer Additional ADL Goal #1: pt will go from sit to stand with min A for adls  OT Frequency: Min 2X/week   Barriers to D/C:            Co-evaluation              AM-PAC OT "6 Clicks" Daily Activity     Outcome Measure Help from another person eating meals?: A Little Help from another person taking care of personal grooming?: A Little Help from another person toileting, which includes using toliet, bedpan, or urinal?: A Lot Help from another person bathing (including washing, rinsing, drying)?: A Lot Help from another person to put on and taking off regular upper body clothing?: A Lot Help from another person to put on and taking off regular lower body clothing?: Total 6 Click Score: 13   End of Session    Activity Tolerance: Patient tolerated treatment well Patient left: in chair;with call bell/phone within reach;with family/visitor present  OT Visit Diagnosis: Pain Pain - Right/Left: Left Pain - part of body: Hip                Time: [id number] OT Time Calculation (min): 22 min Charges:  OT General Charges $OT Visit: 1 Visit OT Evaluation $OT Eval Low Complexity: [place], OTR/L Acute Rehabilitation Services [phone] [facility] pager [phone] office 10/18/2018  [place] 10/18/2018, 12:33 PM

## 2018-10-18 NOTE — Progress Notes (Signed)
Physical Therapy Treatment Patient Details Name: Beth Johnston MRN: [id number] DOB: 1925-04-22 Today's Date: 10/18/2018    History of Present Illness Pt s/p L THR and with hx of CAD, STEMI, LBBB, ischemic cardiomyopathy and bil TKR    PT Comments    Improvement noted in activity tolerance but pt continues to require significant assist for all mobility tasks - limited by pain and limited use of bil shoulders.   Follow Up Recommendations  Follow surgeon's recommendation for DC plan and follow-up therapies     Equipment Recommendations  Rolling walker with 5" wheels    Recommendations for Other Services OT consult     Precautions / Restrictions Precautions Precautions: Fall Restrictions Weight Bearing Restrictions: No Other Position/Activity Restrictions: WBAT    Mobility  Bed Mobility Overal bed mobility: Needs Assistance Bed Mobility: Supine to Sit     Supine to sit: Mod assist;+2 for physical assistance;+2 for safety/equipment     General bed mobility comments: Cues for sequence and use of R LE and UEs to self assist.  Physical assist to manage L LE, to bring trunk to upright sitting and to complete transition to EOB sitting useing pad.  Transfers Overall transfer level: Needs assistance Equipment used: Rolling walker (2 wheeled) Transfers: Sit to/from Stand Sit to Stand: Mod assist;+2 physical assistance;+2 safety/equipment;From elevated surface         General transfer comment: cues for LE management and use of UEs to self assist  Ambulation/Gait Ambulation/Gait assistance: +2 physical assistance;+2 safety/equipment;Min assist;Mod assist Gait Distance (Feet): 68 Feet Assistive device: Rolling walker (2 wheeled) Gait Pattern/deviations: Step-to pattern;Decreased step length - right;Decreased step length - left;Shuffle;Trunk flexed Gait velocity: decr   General Gait Details: Increased time with cues for sequence, posture, position from RW and increased UE  WB   Stairs             Wheelchair Mobility    Modified Rankin (Stroke Patients Only)       Balance Overall balance assessment: Needs assistance Sitting-balance support: No upper extremity supported;Feet supported Sitting balance-Leahy Scale: Fair     Standing balance support: Bilateral upper extremity supported Standing balance-Leahy Scale: Poor                              Cognition Arousal/Alertness: Awake/alert Behavior During Therapy: WFL for tasks assessed/performed Overall Cognitive Status: Within Functional Limits for tasks assessed                                        Exercises Total Joint Exercises Ankle Circles/Pumps: AROM;Both;15 reps;Supine Quad Sets: AROM;Both;10 reps;Supine Heel Slides: AAROM;Left;20 reps;Supine Hip ABduction/ADduction: AAROM;Left;15 reps;Supine    General Comments        Pertinent Vitals/Pain Pain Assessment: Faces Faces Pain Scale: Hurts little more Pain Location: L hip Pain Descriptors / Indicators: Aching;Sore Pain Intervention(s): Limited activity within patient's tolerance;Monitored during session;Premedicated before session;Ice applied    Home Living                      Prior Function            PT Goals (current goals can now be found in the care plan section) Acute Rehab PT Goals Patient Stated Goal: Walk with less pain PT Goal Formulation: With patient Time For Goal Achievement: 10/24/18 Potential to Achieve Goals: Fair  Progress towards PT goals: Progressing toward goals    Frequency    7X/week      PT Plan Current plan remains appropriate    Co-evaluation              AM-PAC PT "6 Clicks" Mobility   Outcome Measure  Help needed turning from your back to your side while in a flat bed without using bedrails?: A Lot Help needed moving from lying on your back to sitting on the side of a flat bed without using bedrails?: A Lot Help needed moving to  and from a bed to a chair (including a wheelchair)?: A Lot Help needed standing up from a chair using your arms (e.g., wheelchair or bedside chair)?: A Lot Help needed to walk in hospital room?: A Lot Help needed climbing 3-5 steps with a railing? : Total 6 Click Score: 11    End of Session Equipment Utilized During Treatment: Gait belt Activity Tolerance: Patient tolerated treatment well Patient left: in chair;with call bell/phone within reach;with family/visitor present Nurse Communication: Mobility status PT Visit Diagnosis: Difficulty in walking, not elsewhere classified (R26.2);Muscle weakness (generalized) (M62.81);Pain Pain - Right/Left: Left Pain - part of body: Hip     Time: 1400-1437 PT Time Calculation (min) (ACUTE ONLY): 37 min  Charges:  $Gait Training: 8-22 mins $Therapeutic Exercise: 8-22 mins                     [place] Pager [phone] Office [phone]    Maigan Bittinger 10/18/2018, 4:23 PM

## 2018-10-18 NOTE — Plan of Care (Signed)
  Problem: Education: Goal: Knowledge of the prescribed therapeutic regimen will improve Outcome: Progressing Goal: Understanding of discharge needs will improve Outcome: Progressing Goal: Individualized Educational Video(s) Outcome: Progressing   Problem: Activity: Goal: Ability to avoid complications of mobility impairment will improve Outcome: Progressing Goal: Ability to tolerate increased activity will improve Outcome: Progressing   Problem: Clinical Measurements: Goal: Postoperative complications will be avoided or minimized Outcome: Progressing   Problem: Pain Management: Goal: Pain level will decrease with appropriate interventions Outcome: Progressing   Problem: Skin Integrity: Goal: Will show signs of wound healing Outcome: Progressing   Problem: Education: Goal: Required Educational Video(s) Outcome: Progressing   Problem: Clinical Measurements: Goal: Ability to maintain clinical measurements within normal limits will improve Outcome: Progressing Goal: Postoperative complications will be avoided or minimized Outcome: Progressing   Problem: Skin Integrity: Goal: Demonstration of wound healing without infection will improve Outcome: Progressing   Problem: Education: Goal: Knowledge of General Education information will improve Description: Including pain rating scale, medication(s)/side effects and non-pharmacologic comfort measures Outcome: Progressing   Problem: Health Behavior/Discharge Planning: Goal: Ability to manage health-related needs will improve Outcome: Progressing   Problem: Clinical Measurements: Goal: Ability to maintain clinical measurements within normal limits will improve Outcome: Progressing Goal: Will remain free from infection Outcome: Progressing Goal: Diagnostic test results will improve Outcome: Progressing Goal: Respiratory complications will improve Outcome: Progressing Goal: Cardiovascular complication will be  avoided Outcome: Progressing   Problem: Activity: Goal: Risk for activity intolerance will decrease Outcome: Progressing   Problem: Nutrition: Goal: Adequate nutrition will be maintained Outcome: Progressing   Problem: Coping: Goal: Level of anxiety will decrease Outcome: Progressing   Problem: Elimination: Goal: Will not experience complications related to bowel motility Outcome: Progressing Goal: Will not experience complications related to urinary retention Outcome: Progressing   Problem: Pain Managment: Goal: General experience of comfort will improve Outcome: Progressing   Problem: Safety: Goal: Ability to remain free from injury will improve Outcome: Progressing   Problem: Skin Integrity: Goal: Risk for impaired skin integrity will decrease Outcome: Progressing

## 2018-10-18 NOTE — Progress Notes (Signed)
Physical Therapy Treatment Patient Details Name: Beth Johnston MRN: [id number] DOB: 1925-02-06 Today's Date: 10/18/2018    History of Present Illness Pt s/p L THR and with hx of CAD, STEMI, LBBB, ischemic cardiomyopathy and bil TKR    PT Comments    Pt with improved activity tolerance and able to ambulate short distance into hall.   Follow Up Recommendations  Follow surgeon's recommendation for DC plan and follow-up therapies     Equipment Recommendations  Rolling walker with 5" wheels    Recommendations for Other Services OT consult     Precautions / Restrictions Precautions Precautions: Fall Restrictions Weight Bearing Restrictions: No Other Position/Activity Restrictions: WBAT    Mobility  Bed Mobility               General bed mobility comments: Pt up in recliner and requests back to same for lunch  Transfers Overall transfer level: Needs assistance Equipment used: Rolling walker (2 wheeled)   Sit to Stand: Mod assist;+2 physical assistance;+2 safety/equipment;From elevated surface         General transfer comment: cues for LE management and use of UEs to self assist  Ambulation/Gait Ambulation/Gait assistance: Mod assist;+2 physical assistance;+2 safety/equipment Gait Distance (Feet): 20 Feet Assistive device: Rolling walker (2 wheeled) Gait Pattern/deviations: Step-to pattern;Decreased step length - right;Decreased step length - left;Shuffle;Trunk flexed Gait velocity: decr   General Gait Details: Increased time with cues for sequence, posture, position from RW and increased UE WB   Stairs             Wheelchair Mobility    Modified Rankin (Stroke Patients Only)       Balance Overall balance assessment: Needs assistance Sitting-balance support: No upper extremity supported;Feet supported Sitting balance-Leahy Scale: Fair     Standing balance support: Bilateral upper extremity supported Standing balance-Leahy Scale: Poor                               Cognition Arousal/Alertness: Awake/alert Behavior During Therapy: WFL for tasks assessed/performed Overall Cognitive Status: Within Functional Limits for tasks assessed                                        Exercises      General Comments        Pertinent Vitals/Pain Pain Assessment: Faces Faces Pain Scale: Hurts even more Pain Location: L hip Pain Descriptors / Indicators: Aching;Sore Pain Intervention(s): Limited activity within patient's tolerance;Monitored during session;Premedicated before session;Ice applied    Home Living Family/patient expects to be discharged to:: Private residence Living Arrangements: Children Available Help at Discharge: Family         Home Equipment: Toilet riser;Shower seat;Grab bars - toilet      Prior Function        Comments: pt states she could bathe and dress herself prior to sx   PT Goals (current goals can now be found in the care plan section) Acute Rehab PT Goals Patient Stated Goal: Walk with less pain PT Goal Formulation: With patient Time For Goal Achievement: 10/24/18 Potential to Achieve Goals: Fair Progress towards PT goals: Progressing toward goals    Frequency    7X/week      PT Plan Current plan remains appropriate    Co-evaluation              AM-PAC PT "  6 Clicks" Mobility   Outcome Measure  Help needed turning from your back to your side while in a flat bed without using bedrails?: A Lot Help needed moving from lying on your back to sitting on the side of a flat bed without using bedrails?: A Lot Help needed moving to and from a bed to a chair (including a wheelchair)?: A Lot Help needed standing up from a chair using your arms (e.g., wheelchair or bedside chair)?: A Lot Help needed to walk in hospital room?: A Lot Help needed climbing 3-5 steps with a railing? : Total 6 Click Score: 11    End of Session Equipment Utilized During  Treatment: Gait belt Activity Tolerance: Patient limited by fatigue;Patient limited by pain Patient left: in bed;with call bell/phone within reach;with bed alarm set;with family/visitor present Nurse Communication: Mobility status PT Visit Diagnosis: Difficulty in walking, not elsewhere classified (R26.2);Muscle weakness (generalized) (M62.81);Pain Pain - Right/Left: Left Pain - part of body: Hip     Time: [id number] PT Time Calculation (min) (ACUTE ONLY): 23 min  Charges:  $Gait Training: 8-22 mins                     [place] Pager [phone] Office [phone]    Nyeli Holtmeyer 10/18/2018, 12:55 PM

## 2018-10-19 LAB — CBC
HCT: 27.4 % — ABNORMAL LOW (ref 36.0–46.0)
Hemoglobin: 8.3 g/dL — ABNORMAL LOW (ref 12.0–15.0)
MCH: 27.3 pg (ref 26.0–34.0)
MCHC: 30.3 g/dL (ref 30.0–36.0)
MCV: 90.1 fL (ref 80.0–100.0)
Platelets: 186 10*3/uL (ref 150–400)
RBC: 3.04 MIL/uL — ABNORMAL LOW (ref 3.87–5.11)
RDW: 13.4 % (ref 11.5–15.5)
WBC: 8.3 10*3/uL (ref 4.0–10.5)
nRBC: 0 % (ref 0.0–0.2)

## 2018-10-19 MED ORDER — HYDROCODONE-ACETAMINOPHEN 5-325 MG PO TABS: 0.5000 | ORAL_TABLET | Freq: Four times a day (QID) | ORAL | 0 refills | Status: DC | PRN

## 2018-10-19 MED ORDER — HYDROCODONE-ACETAMINOPHEN 5-325 MG PO TABS
0.5000 | ORAL_TABLET | ORAL | Status: DC | PRN
  Administered 2018-10-19 – 2018-10-21 (×3): 0.5 via ORAL
  Filled 2018-10-19 (×3): qty 1

## 2018-10-19 NOTE — Progress Notes (Signed)
Occupational Therapy Treatment Patient Details Name: Beth Johnston MRN: [id number] DOB: 08-Oct-1925 Today's Date: 10/19/2018    History of present illness Pt s/p L THR and with hx of CAD, STEMI, LBBB, ischemic cardiomyopathy and bil TKR   OT comments  Pt progressing towards acute OT goals. Focus of session was household distance functional mobility, toilet transfer, and pericare. Pt needing +2 to +1 assist during session. Daughter present at start and end of session. D/c plan remains appropriate.    Follow Up Recommendations  Follow surgeon's recommendation for DC plan and follow-up therapies;Supervision/Assistance - 24 hour    Equipment Recommendations  3 in 1 bedside commode    Recommendations for Other Services      Precautions / Restrictions Precautions Precautions: Fall Restrictions Weight Bearing Restrictions: No Other Position/Activity Restrictions: WBAT       Mobility Bed Mobility               General bed mobility comments: Pt up in chair on arrival   Transfers Overall transfer level: Needs assistance Equipment used: Rolling walker (2 wheeled) Transfers: Sit to/from Stand Sit to Stand: Mod assist;+2 physical assistance;+2 safety/equipment;From elevated surface         General transfer comment: cues for LE management and use of UEs to self assist. Improved from +2 assist at start of session to +1 assist at end of session for toileting tasks    Balance Overall balance assessment: Needs assistance Sitting-balance support: No upper extremity supported;Feet supported Sitting balance-Leahy Scale: Fair     Standing balance support: Bilateral upper extremity supported Standing balance-Leahy Scale: Poor                             ADL either performed or assessed with clinical judgement   ADL Overall ADL's : Needs assistance/impaired                         Toilet Transfer: Moderate assistance;+2 for physical  assistance;BSC;Ambulation;RW Toilet Transfer Details (indicate cue type and reason): mod +2 physical A to stand from recliner. Transferred to [facility] over toilet in the bathroom after completing household distance functional mobility. Second sit<>stand during this session was completed with mod A +1. Walked with +1 assist Toileting- Clothing Manipulation and Hygiene: Maximal assistance;Sit to/from stand Toileting - Clothing Manipulation Details (indicate cue type and reason): assist with LB clothing management pericare in standing     Functional mobility during ADLs: Minimal assistance;Rolling walker General ADL Comments: toilet transfer and pericare +2 assist at start of session progressing to +1 assist     Vision       Perception     Praxis      Cognition Arousal/Alertness: Awake/alert Behavior During Therapy: WFL for tasks assessed/performed Overall Cognitive Status: Within Functional Limits for tasks assessed                                          Exercises Exercises: Total Joint Total Joint Exercises Ankle Circles/Pumps: AROM;Both;15 reps;Supine Quad Sets: AROM;Both;10 reps;Supine Heel Slides: AAROM;Left;20 reps;Supine Hip ABduction/ADduction: AAROM;Left;15 reps;Supine   Shoulder Instructions       General Comments      Pertinent Vitals/ Pain       Pain Assessment: 0-10 Pain Score: 4  Pain Location: L hip Pain Descriptors / Indicators: Aching;Sore Pain Intervention(s):  Monitored during session;Repositioned;Premedicated before session;Limited activity within patient's tolerance  Home Living                                          Prior Functioning/Environment              Frequency  Min 2X/week        Progress Toward Goals  OT Goals(current goals can now be found in the care plan section)  Progress towards OT goals: Progressing toward goals  Acute Rehab OT Goals Patient Stated Goal: Walk with less pain OT Goal  Formulation: With patient Time For Goal Achievement: 11/01/18 Potential to Achieve Goals: Good ADL Goals Pt Will Transfer to Toilet: with min assist;bedside commode;ambulating;stand pivot transfer Additional ADL Goal #1: pt will go from sit to stand with min A for adls  Plan Discharge plan remains appropriate    Co-evaluation    PT/OT/SLP Co-Evaluation/Treatment: Yes Reason for Co-Treatment: For patient/therapist safety;To address functional/ADL transfers   OT goals addressed during session: ADL's and self-care;Proper use of [employer] equipment and DME      AM-PAC OT "6 Clicks" Daily Activity     Outcome Measure   Help from another person eating meals?: None Help from another person taking care of personal grooming?: A Little Help from another person toileting, which includes using toliet, bedpan, or urinal?: A Lot Help from another person bathing (including washing, rinsing, drying)?: A Lot Help from another person to put on and taking off regular upper body clothing?: A Little Help from another person to put on and taking off regular lower body clothing?: A Lot 6 Click Score: 16    End of Session Equipment Utilized During Treatment: Gait belt;Rolling walker  OT Visit Diagnosis: Pain Pain - Right/Left: Left Pain - part of body: Hip   Activity Tolerance Patient tolerated treatment well   Patient Left in chair;with call bell/phone within reach;with family/visitor present;with chair alarm set   Nurse Communication          Time: [id number] OT Time Calculation (min): 31 min  Charges: OT General Charges $OT Visit: 1 Visit OT Treatments $Self Care/Home Management : 8-22 mins  Tyrone Schimke, OT Acute Rehabilitation Services Pager: [phone] Office: [phone]    Hortencia Pilar 10/19/2018, 3:45 PM

## 2018-10-19 NOTE — Progress Notes (Signed)
Subjective: 2 Days Post-Op Procedure(s) (LRB): LEFT TOTAL HIP ARTHROPLASTY ANTERIOR APPROACH (Left) Patient reports pain as moderate.  A little slower with mobility this am.  H/H stable.  Objective: Vital signs in last 24 hours: Temp:  [98.2 F (36.8 C)-99.3 F (37.4 C)] 98.2 F (36.8 C) (09/20 0442) Pulse Rate:  [74-81] 81 (09/20 0853) Resp:  [15-18] 18 (09/20 0442) BP: (128-143)/(53-69) 143/53 (09/20 0853) SpO2:  [97 %-100 %] 100 % (09/20 0442)  Intake/Output from previous day: 09/19 0701 - 09/20 0700 In: 1421.9 [P.O.:580; I.V.:841.9] Out: 425 [Urine:425] Intake/Output this shift: Total I/O In: 270 [P.O.:270] Out: 300 [Urine:300]  Recent Labs    10/17/18 0741 10/17/18 1108 10/18/18 0222 10/19/18 0259  HGB 8.4* 8.2* 8.8* 8.3*   Recent Labs    10/18/18 0222 10/19/18 0259  WBC 5.8 8.3  RBC 3.20* 3.04*  HCT 28.5* 27.4*  PLT 192 186   Recent Labs    10/17/18 0741 10/17/18 1108 10/18/18 0222  NA 140 141 136  K 3.9 4.2 3.9  CL 110  --  107  CO2 22  --  23  BUN 22  --  14  CREATININE 0.93  --  0.78  GLUCOSE 137*  --  102*  CALCIUM 9.0  --  8.4*   No results for input(s): LABPT, INR in the last 72 hours.  Sensation intact distally Intact pulses distally Dorsiflexion/Plantar flexion intact Incision: scant drainage   Assessment/Plan: 2 Days Post-Op Procedure(s) (LRB): LEFT TOTAL HIP ARTHROPLASTY ANTERIOR APPROACH (Left) Up with therapy Plan for discharge tomorrow Discharge home with home health      Mcarthur Rossetti 10/19/2018, 12:22 PM

## 2018-10-19 NOTE — Progress Notes (Signed)
Physical Therapy Treatment Patient Details Name: Beth Johnston MRN: [id number] DOB: 05-30-1925 Today's Date: 10/19/2018    History of Present Illness Pt s/p L THR and with hx of CAD, STEMI, LBBB, ischemic cardiomyopathy and bil TKR    PT Comments    Pt continues cooperative but ltd this am by increased pain level - pt wanting to attempt PT without premed.   Follow Up Recommendations  Follow surgeon's recommendation for DC plan and follow-up therapies     Equipment Recommendations  Rolling walker with 5" wheels    Recommendations for Other Services OT consult     Precautions / Restrictions Precautions Precautions: Fall Restrictions Weight Bearing Restrictions: No Other Position/Activity Restrictions: WBAT    Mobility  Bed Mobility               General bed mobility comments: Pt up in chair and requests back to same  Transfers Overall transfer level: Needs assistance Equipment used: Rolling walker (2 wheeled) Transfers: Sit to/from Stand Sit to Stand: Mod assist;+2 physical assistance;+2 safety/equipment;From elevated surface         General transfer comment: cues for LE management and use of UEs to self assist  Ambulation/Gait Ambulation/Gait assistance: +2 physical assistance;+2 safety/equipment;Min assist Gait Distance (Feet): 20 Feet Assistive device: Rolling walker (2 wheeled) Gait Pattern/deviations: Step-to pattern;Decreased step length - right;Decreased step length - left;Shuffle;Trunk flexed Gait velocity: decr   General Gait Details: Increased time with cues for sequence, posture, position from RW and increased UE WB   Stairs             Wheelchair Mobility    Modified Rankin (Stroke Patients Only)       Balance Overall balance assessment: Needs assistance Sitting-balance support: No upper extremity supported;Feet supported Sitting balance-Leahy Scale: Fair     Standing balance support: Bilateral upper extremity  supported Standing balance-Leahy Scale: Poor                              Cognition Arousal/Alertness: Awake/alert Behavior During Therapy: WFL for tasks assessed/performed Overall Cognitive Status: Within Functional Limits for tasks assessed                                        Exercises      General Comments        Pertinent Vitals/Pain Pain Assessment: 0-10 Pain Score: 8  Pain Location: L hip Pain Descriptors / Indicators: Aching;Sore Pain Intervention(s): Limited activity within patient's tolerance;Monitored during session;Patient requesting pain meds-RN notified;Ice applied    Home Living                      Prior Function            PT Goals (current goals can now be found in the care plan section) Acute Rehab PT Goals Patient Stated Goal: Walk with less pain PT Goal Formulation: With patient Time For Goal Achievement: 10/24/18 Potential to Achieve Goals: Fair Progress towards PT goals: Progressing toward goals    Frequency    7X/week      PT Plan Current plan remains appropriate    Co-evaluation              AM-PAC PT "6 Clicks" Mobility   Outcome Measure  Help needed turning from your back to your side while in a  flat bed without using bedrails?: A Lot Help needed moving from lying on your back to sitting on the side of a flat bed without using bedrails?: A Lot Help needed moving to and from a bed to a chair (including a wheelchair)?: A Lot Help needed standing up from a chair using your arms (e.g., wheelchair or bedside chair)?: A Lot Help needed to walk in hospital room?: A Lot Help needed climbing 3-5 steps with a railing? : Total 6 Click Score: 11    End of Session Equipment Utilized During Treatment: Gait belt Activity Tolerance: Patient limited by pain Patient left: in chair;with call bell/phone within reach;with family/visitor present Nurse Communication: Mobility status PT Visit Diagnosis:  Difficulty in walking, not elsewhere classified (R26.2);Muscle weakness (generalized) (M62.81);Pain Pain - Right/Left: Left Pain - part of body: Hip     Time: 1050-1105 PT Time Calculation (min) (ACUTE ONLY): 15 min  Charges:  $Gait Training: 8-22 mins                     [place] Pager [phone] Office [phone]    Toini Failla 10/19/2018, 3:08 PM

## 2018-10-19 NOTE — Progress Notes (Signed)
Physical Therapy Treatment Patient Details Name: Beth Johnston MRN: [id number] DOB: [birth year] Today's Date: 10/19/2018    History of Present Illness Pt s/p L THR and with hx of CAD, STEMI, LBBB, ischemic cardiomyopathy and bil TKR    PT Comments    Pt progressing well with ambulation but continues to struggle with bed mobility and transfers sit<>stand 2* ltd use of shoulders.  Pt's dtr present and reports pt has lift chair at home and is able to sleep in same.  Pt also has 3n1 for home use to assist with up/down from commode.   Follow Up Recommendations  Follow surgeon's recommendation for DC plan and follow-up therapies     Equipment Recommendations  Rolling walker with 5" wheels    Recommendations for Other Services OT consult     Precautions / Restrictions Precautions Precautions: Fall Restrictions Weight Bearing Restrictions: No Other Position/Activity Restrictions: WBAT    Mobility  Bed Mobility               General bed mobility comments: Pt up in chair on arrival and in bathroom with OT at end of session  Transfers Overall transfer level: Needs assistance Equipment used: Rolling walker (2 wheeled) Transfers: Sit to/from Stand Sit to Stand: Mod assist;+2 physical assistance;+2 safety/equipment;From elevated surface         General transfer comment: cues for LE management and use of UEs to self assist  Ambulation/Gait Ambulation/Gait assistance: Min assist Gait Distance (Feet): 130 Feet(with one standing rest break) Assistive device: Rolling walker (2 wheeled) Gait Pattern/deviations: Step-to pattern;Decreased step length - right;Decreased step length - left;Shuffle;Trunk flexed Gait velocity: decr   General Gait Details: Increased time with cues for sequence, posture, position from RW and increased UE WB   Stairs             Wheelchair Mobility    Modified Rankin (Stroke Patients Only)       Balance Overall balance assessment:  Needs assistance Sitting-balance support: No upper extremity supported;Feet supported Sitting balance-Leahy Scale: Fair     Standing balance support: Bilateral upper extremity supported Standing balance-Leahy Scale: Poor                              Cognition Arousal/Alertness: Awake/alert Behavior During Therapy: WFL for tasks assessed/performed Overall Cognitive Status: Within Functional Limits for tasks assessed                                        Exercises Total Joint Exercises Ankle Circles/Pumps: AROM;Both;15 reps;Supine Quad Sets: AROM;Both;10 reps;Supine Heel Slides: AAROM;Left;20 reps;Supine Hip ABduction/ADduction: AAROM;Left;15 reps;Supine    General Comments        Pertinent Vitals/Pain Pain Assessment: 0-10 Pain Score: 4  Pain Location: L hip Pain Descriptors / Indicators: Aching;Sore Pain Intervention(s): Limited activity within patient's tolerance;Monitored during session;Premedicated before session    Home Living                      Prior Function            PT Goals (current goals can now be found in the care plan section) Acute Rehab PT Goals Patient Stated Goal: Walk with less pain PT Goal Formulation: With patient Time For Goal Achievement: 10/24/18 Potential to Achieve Goals: Fair Progress towards PT goals: Progressing toward goals  Frequency    7X/week      PT Plan Current plan remains appropriate    Co-evaluation              AM-PAC PT "6 Clicks" Mobility   Outcome Measure  Help needed turning from your back to your side while in a flat bed without using bedrails?: A Lot Help needed moving from lying on your back to sitting on the side of a flat bed without using bedrails?: A Lot Help needed moving to and from a bed to a chair (including a wheelchair)?: A Lot Help needed standing up from a chair using your arms (e.g., wheelchair or bedside chair)?: A Lot Help needed to walk in  hospital room?: A Little Help needed climbing 3-5 steps with a railing? : A Lot 6 Click Score: 13    End of Session Equipment Utilized During Treatment: Gait belt Activity Tolerance: Patient tolerated treatment well Patient left: Other (comment)(batrhroom with OT) Nurse Communication: Mobility status PT Visit Diagnosis: Difficulty in walking, not elsewhere classified (R26.2);Muscle weakness (generalized) (M62.81);Pain Pain - Right/Left: Left Pain - part of body: Hip     Time: 1415-1444 PT Time Calculation (min) (ACUTE ONLY): 29 min  Charges:  $Gait Training: 8-22 mins $Therapeutic Exercise: 8-22 mins                     [place] Pager [phone] Office [phone]    Reena Borromeo 10/19/2018, 3:12 PM

## 2018-10-20 ENCOUNTER — Encounter (HOSPITAL_COMMUNITY): Payer: Self-pay | Admitting: Orthopaedic Surgery

## 2018-10-20 LAB — CBC
HCT: 24.6 % — ABNORMAL LOW (ref 36.0–46.0)
Hemoglobin: 7.7 g/dL — ABNORMAL LOW (ref 12.0–15.0)
MCH: 27.8 pg (ref 26.0–34.0)
MCHC: 31.3 g/dL (ref 30.0–36.0)
MCV: 88.8 fL (ref 80.0–100.0)
Platelets: 164 10*3/uL (ref 150–400)
RBC: 2.77 MIL/uL — ABNORMAL LOW (ref 3.87–5.11)
RDW: 13.1 % (ref 11.5–15.5)
WBC: 6.8 10*3/uL (ref 4.0–10.5)
nRBC: 0 % (ref 0.0–0.2)

## 2018-10-20 LAB — PREPARE RBC (CROSSMATCH)

## 2018-10-20 MED ORDER — BISACODYL 10 MG RE SUPP
10.0000 mg | Freq: Every day | RECTAL | Status: DC | PRN
  Administered 2018-10-20: 10 mg via RECTAL
  Filled 2018-10-20: qty 1

## 2018-10-20 MED ORDER — SODIUM CHLORIDE 0.9% IV SOLUTION: Freq: Once | INTRAVENOUS | Status: DC

## 2018-10-20 NOTE — Progress Notes (Signed)
Physical Therapy Treatment Patient Details Name: Beth Johnston MRN: [id number] DOB: 1925-06-11 Today's Date: 10/20/2018    History of Present Illness Pt s/p L THR and with hx of CAD, STEMI, LBBB, ischemic cardiomyopathy and bil TKR    PT Comments    POD # pm session Family member present during session for education on safe handling.  Assisted OOB to amb to bathroom.   General Gait Details: Increased time with cues for sequence, posture, position from RW and increased UE WB   only was able to amb to and from bathroom.  Pt required assist for peri care as she was unable to self perform and obtain a safe standing balance.  Amb to recliner.  Positioned to comfort.    Follow Up Recommendations  Follow surgeon's recommendation for DC plan and follow-up therapies     Equipment Recommendations  Rolling walker with 5" wheels;3in1 (PT)    Recommendations for Other Services       Precautions / Restrictions Precautions Precautions: Fall Precaution Comments: "bad shoulders" Restrictions Weight Bearing Restrictions: No Other Position/Activity Restrictions: WBAT    Mobility  Bed Mobility Overal bed mobility: Needs Assistance Bed Mobility: Supine to Sit     Supine to sit: Mod assist;Max assist     General bed mobility comments: Mod Assist to guide/support L LE and Max Asssist for upper body as pt was unable to complete scooting to EOB.  "Bad shoulders"  Transfers Overall transfer level: Needs assistance Equipment used: Rolling walker (2 wheeled) Transfers: Sit to/from [facility] Sit to Stand: Min assist;Mod assist Stand pivot transfers: Mod assist;Max assist       General transfer comment: 50% VC's on proper hand placement however pt unable to push self up from bed.  Assisted by pulling self up from walker.  Instructed family on proper tech and safe handling.  Also assistsed with raised toilet transfer.  Ambulation/Gait Ambulation/Gait assistance: Min  guard;Min assist Gait Distance (Feet): 20 Feet(10 feet x 2 to and from bathroom) Assistive device: Rolling walker (2 wheeled) Gait Pattern/deviations: Step-to pattern;Decreased step length - right;Decreased step length - left;Shuffle;Trunk flexed Gait velocity: decreased   General Gait Details: Increased time with cues for sequence, posture, position from RW and increased UE WB   only was able to amb to and from bathroom   Stairs             Wheelchair Mobility    Modified Rankin (Stroke Patients Only)       Balance                                            Cognition Arousal/Alertness: Awake/alert Behavior During Therapy: WFL for tasks assessed/performed Overall Cognitive Status: Within Functional Limits for tasks assessed                                        Exercises      General Comments        Pertinent Vitals/Pain Pain Assessment: 0-10 Pain Score: 7  Pain Location: L hip Pain Descriptors / Indicators: Aching;Sore Pain Intervention(s): Monitored during session;Repositioned;Ice applied    Home Living                      Prior Function  PT Goals (current goals can now be found in the care plan section) Progress towards PT goals: Progressing toward goals    Frequency    7X/week      PT Plan Current plan remains appropriate    Co-evaluation              AM-PAC PT "6 Clicks" Mobility   Outcome Measure  Help needed turning from your back to your side while in a flat bed without using bedrails?: A Little Help needed moving from lying on your back to sitting on the side of a flat bed without using bedrails?: A Little Help needed moving to and from a bed to a chair (including a wheelchair)?: A Little Help needed standing up from a chair using your arms (e.g., wheelchair or bedside chair)?: A Little Help needed to walk in hospital room?: A Little Help needed climbing 3-5 steps with a  railing? : A Lot 6 Click Score: 17    End of Session Equipment Utilized During Treatment: Gait belt Activity Tolerance: Patient tolerated treatment well Patient left: in chair;with call bell/phone within reach Nurse Communication: Mobility status PT Visit Diagnosis: Difficulty in walking, not elsewhere classified (R26.2);Muscle weakness (generalized) (M62.81);Pain Pain - Right/Left: Left Pain - part of body: Hip     Time: [id number] PT Time Calculation (min) (ACUTE ONLY): 26 min  Charges:  $Gait Training: 8-22 mins $Therapeutic Activity: 8-22 mins                     Rica Koyanagi  PTA Acute  Rehabilitation Services Pager      [phone] Office      [phone]

## 2018-10-20 NOTE — Care Management Important Message (Signed)
Important Message  Patient Details IM Letter given to Velva Harman RN to present  To Patient Name: Beth Johnston MRN: [id number] Date of Birth: 12/05/1925   Medicare Important Message Given:  Yes     Kerin Salen 10/20/2018, 12:14 PM

## 2018-10-20 NOTE — Progress Notes (Signed)
Physical Therapy Treatment Patient Details Name: Beth Johnston MRN: [id number] DOB: [birth year] Today's Date: 10/20/2018    History of Present Illness Pt s/p L THR and with hx of CAD, STEMI, LBBB, ischemic cardiomyopathy and bil TKR    PT Comments    POD # 3 am session Pt receiving one unit with site at R wrist.  Sp stayed in bed and performed THR TE's followed by [facility].    Follow Up Recommendations  Follow surgeon's recommendation for DC plan and follow-up therapies     Equipment Recommendations  Rolling walker with 5" wheels;3in1 (PT)    Recommendations for Other Services       Precautions / Restrictions Precautions Precautions: Fall Precaution Comments: "bad shoulders" Restrictions Weight Bearing Restrictions: No Other Position/Activity Restrictions: WBAT    Mobility  Bed Mobility                  Transfers                    Ambulation/Gait                 Stairs             Wheelchair Mobility    Modified Rankin (Stroke Patients Only)       Balance                                            Cognition Arousal/Alertness: Awake/alert Behavior During Therapy: WFL for tasks assessed/performed Overall Cognitive Status: Within Functional Limits for tasks assessed                                        Exercises   Total Hip Replacement TE's 10 reps ankle pumps 10 reps knee presses 10 reps heel slides 10 reps SAQ's 10 reps ABD Followed by [facility]     General Comments        Pertinent Vitals/Pain Pain Assessment: 0-10 Pain Score: 7  Pain Location: L hip Pain Descriptors / Indicators: Aching;Sore Pain Intervention(s): Monitored during session;Repositioned;Ice applied    Home Living                      Prior Function            PT Goals (current goals can now be found in the care plan section) Progress towards PT goals: Progressing toward goals    Frequency          PT Plan Current plan remains appropriate    Co-evaluation              AM-PAC PT "6 Clicks" Mobility   Outcome Measure  Help needed turning from your back to your side while in a flat bed without using bedrails?: A Little Help needed moving from lying on your back to sitting on the side of a flat bed without using bedrails?: A Little Help needed moving to and from a bed to a chair (including a wheelchair)?: A Little Help needed standing up from a chair using your arms (e.g., wheelchair or bedside chair)?: A Little Help needed to walk in hospital room?: A Little Help needed climbing 3-5 steps with a railing? : A Lot 6 Click Score: 17    End of Session  Activity Tolerance: Patient tolerated treatment well     PT Visit Diagnosis: Difficulty in walking, not elsewhere classified (R26.2);Muscle weakness (generalized) (M62.81);Pain Pain - Right/Left: Left Pain - part of body: Hip     Time: [id number] PT Time Calculation (min) (ACUTE ONLY): 14 min  Charges:  $Therapeutic Exercise: 8-22 mins                     Rica Koyanagi  PTA Acute  Rehabilitation Services Pager      [phone] Office      [phone]

## 2018-10-20 NOTE — Plan of Care (Signed)
  Problem: Education: Goal: Knowledge of the prescribed therapeutic regimen will improve Outcome: Progressing Goal: Understanding of discharge needs will improve Outcome: Progressing Goal: Individualized Educational Video(s) Outcome: Progressing   Problem: Activity: Goal: Ability to avoid complications of mobility impairment will improve Outcome: Progressing   

## 2018-10-20 NOTE — Progress Notes (Signed)
Patient ID: Beth Johnston, female   DOB: 03-May-1925, 85 y.o.   MRN: [id number] No acute changes.  Does have acute on chronic anemia.  Hgb 8.8 prior to admission.  Hgb 7.7 this am. Vitals stable.  Left hip dressing clean and dry.  Needs one more good day of therapy and will transfuse one unit of blood today.  Anticipate discharge to home tomorrow.

## 2018-10-21 ENCOUNTER — Other Ambulatory Visit: Payer: Self-pay | Admitting: Orthopaedic Surgery

## 2018-10-21 DIAGNOSIS — I5022 Chronic systolic (congestive) heart failure: Secondary | ICD-10-CM

## 2018-10-21 LAB — CBC
HCT: 27.5 % — ABNORMAL LOW (ref 36.0–46.0)
Hemoglobin: 8.7 g/dL — ABNORMAL LOW (ref 12.0–15.0)
MCH: 27.4 pg (ref 26.0–34.0)
MCHC: 31.6 g/dL (ref 30.0–36.0)
MCV: 86.5 fL (ref 80.0–100.0)
Platelets: 185 10*3/uL (ref 150–400)
RBC: 3.18 MIL/uL — ABNORMAL LOW (ref 3.87–5.11)
RDW: 13.6 % (ref 11.5–15.5)
WBC: 5.6 10*3/uL (ref 4.0–10.5)
nRBC: 0 % (ref 0.0–0.2)

## 2018-10-21 LAB — BPAM RBC
Blood Product Expiration Date: 202010022359
Blood Product Expiration Date: 202010022359
Blood Product Expiration Date: 202010022359
ISSUE DATE / TIME: 202009181005
ISSUE DATE / TIME: 202009181005
ISSUE DATE / TIME: 202009210916
Unit Type and Rh: 9500
Unit Type and Rh: 9500
Unit Type and Rh: 9500

## 2018-10-21 LAB — TYPE AND SCREEN
ABO/RH(D): O NEG
Antibody Screen: NEGATIVE
Unit division: 0
Unit division: 0
Unit division: 0

## 2018-10-21 MED ORDER — ONDANSETRON 4 MG PO TBDP: 4.0000 mg | ORAL_TABLET | Freq: Three times a day (TID) | ORAL | 0 refills | Status: DC | PRN

## 2018-10-21 NOTE — Progress Notes (Signed)
Patient ID: Beth Johnston, female   DOB: [birth year], 85 y.o.   MRN: [id number] Back to her baseline/admission H&H.  Making slow progress overall.  Left hip stable.  Can be discharged to home today.

## 2018-10-21 NOTE — Plan of Care (Signed)
Pt alert and oriented, doing well this am.  Plan to dc home today per MD order.  Pain well managed, voiding, up with PT. RN will monitor.

## 2018-10-21 NOTE — Progress Notes (Signed)
Physical Therapy Treatment Patient Details Name: Beth Johnston MRN: [id number] DOB: December 27, 1925 Today's Date: 10/21/2018    History of Present Illness Pt s/p L THR and with hx of CAD, STEMI, LBBB, ischemic cardiomyopathy and bil TKR    PT Comments    Pt with much progressed ambulation distance and form this session, and required less physical assist for all mobility. Pt proficiently performed stair navigation, pt's daughter present and knows to guard pt from below her on the steps. Pt performed THA exercises well, requiring min verbal and tactile cuing. Standing exercises deferred until HHPT. Pt and pt's daughter with no further questions, pt appropriate to d/c.    Follow Up Recommendations  Follow surgeon's recommendation for DC plan and follow-up therapies     Equipment Recommendations  Rolling walker with 5" wheels;3in1 (PT)    Recommendations for Other Services       Precautions / Restrictions Precautions Precautions: Fall Precaution Comments: "bad shoulders" Restrictions Weight Bearing Restrictions: No Other Position/Activity Restrictions: WBAT    Mobility  Bed Mobility Overal bed mobility: Needs Assistance             General bed mobility comments: pt up in chair upon PT arrival, and requested back to chair post-mobility.  Transfers Overall transfer level: Needs assistance Equipment used: Rolling walker (2 wheeled) Transfers: Sit to/from Stand Sit to Stand: Min guard         General transfer comment: Min guard for safety, verbal cuing for hand placement when rising. Sit to stand x2, once from recliner and once from mat in rehab gym post-stair navigation.  Ambulation/Gait Ambulation/Gait assistance: Min guard Gait Distance (Feet): 100 Feet(2x100 ft) Assistive device: Rolling walker (2 wheeled) Gait Pattern/deviations: Shuffle;Trunk flexed;Step-through pattern;Decreased stride length Gait velocity: decr   General Gait Details: Min guard for safety,  verbal cuing for sequencing, placement in RW, upright posture x2.   Stairs Stairs: Yes Stairs assistance: Min guard Stair Management: Two rails;Step to pattern;Forwards Number of Stairs: 3 General stair comments: min guard for safety. Verbal cuing for sequencing (ascending with RLE leading, descending with LLE leading), step-to gait pattern.   Wheelchair Mobility    Modified Rankin (Stroke Patients Only)       Balance Overall balance assessment: Needs assistance Sitting-balance support: No upper extremity supported;Feet supported Sitting balance-Leahy Scale: Fair     Standing balance support: Bilateral upper extremity supported Standing balance-Leahy Scale: Poor                              Cognition Arousal/Alertness: Awake/alert Behavior During Therapy: WFL for tasks assessed/performed Overall Cognitive Status: Within Functional Limits for tasks assessed                                        Exercises Total Joint Exercises Ankle Circles/Pumps: AROM;Both;15 reps;Supine Quad Sets: AROM;10 reps;Left;Seated Heel Slides: AAROM;Left;10 reps;Seated Hip ABduction/ADduction: AAROM;Left;10 reps;Seated Long Arc Quad: AROM;Left;10 reps;Seated    General Comments General comments (skin integrity, edema, etc.): Pt educated on car transfer. Pt wih step-up ledge due to tall SUV, so pt and pt's daughter educated on stepping up with LLE (although the post-operative side) facing towards front of car with HHA and holding onto interior car handle, and completing small pivot on LLE to come to sitting in car.      Pertinent Vitals/Pain Pain Assessment: 0-10 Pain  Score: 2  Pain Location: L hip Pain Descriptors / Indicators: Aching;Sore Pain Intervention(s): Premedicated before session;Monitored during session;Repositioned;Limited activity within patient's tolerance;Ice applied    Home Living                      Prior Function            PT  Goals (current goals can now be found in the care plan section) Acute Rehab PT Goals Patient Stated Goal: Walk with less pain Time For Goal Achievement: 10/24/18 Potential to Achieve Goals: Good Progress towards PT goals: Progressing toward goals    Frequency    7X/week      PT Plan Current plan remains appropriate    Co-evaluation              AM-PAC PT "6 Clicks" Mobility   Outcome Measure  Help needed turning from your back to your side while in a flat bed without using bedrails?: A Little Help needed moving from lying on your back to sitting on the side of a flat bed without using bedrails?: A Little Help needed moving to and from a bed to a chair (including a wheelchair)?: A Little Help needed standing up from a chair using your arms (e.g., wheelchair or bedside chair)?: A Little Help needed to walk in hospital room?: A Little Help needed climbing 3-5 steps with a railing? : A Little 6 Click Score: 18    End of Session Equipment Utilized During Treatment: Gait belt Activity Tolerance: Patient tolerated treatment well Patient left: in chair;with call bell/phone within reach;with family/visitor present Nurse Communication: Mobility status PT Visit Diagnosis: Difficulty in walking, not elsewhere classified (R26.2);Muscle weakness (generalized) (M62.81);Pain Pain - Right/Left: Left Pain - part of body: Hip     Time: [id number] PT Time Calculation (min) (ACUTE ONLY): 31 min  Charges:  $Gait Training: 23-37 mins                    Julien Girt, PT Acute Rehabilitation Services Pager [phone]  Office [phone]    Roxine Caddy D Elonda Husky 10/21/2018, 12:13 PM

## 2018-10-21 NOTE — Discharge Summary (Signed)
Patient ID: LISSA OLIVA MRN: [id number] DOB/AGE: [birth year] 83 y.o.  Admit date: 10/17/2018 Discharge date: 10/21/2018  Admission Diagnoses:  Principal Problem:   Unilateral primary osteoarthritis, left hip Active Problems:   Status post total replacement of left hip   Discharge Diagnoses:  Same  Past Medical History:  Diagnosis Date  . Arthritis    "mostly my hands" (12/25/2013)  . Asthma   . Ischemic cardiomyopathy    a. EF 15%  . PONV (postoperative nausea and vomiting)   . Upper airway cough syndrome    Post-nasal drip    Surgeries: Procedure(s): LEFT TOTAL HIP ARTHROPLASTY ANTERIOR APPROACH on 10/17/2018   Consultants:   Discharged Condition: Improved  Hospital Course: OBIANUJU KARIMI is an 85 y.o. female who was admitted 10/17/2018 for operative treatment ofUnilateral primary osteoarthritis, left hip. Patient has severe unremitting pain that affects sleep, daily activities, and work/hobbies. After pre-op clearance the patient was taken to the operating room on 10/17/2018 and underwent  Procedure(s): LEFT TOTAL HIP ARTHROPLASTY ANTERIOR APPROACH.    Patient was given perioperative antibiotics:  Anti-infectives (From admission, onward)   Start     Dose/Rate Route Frequency Ordered Stop   10/17/18 2000  vancomycin (VANCOCIN) IVPB 1000 mg/200 mL premix     1,000 mg 200 mL/hr over 60 Minutes Intravenous Every 12 hours 10/17/18 1409 10/17/18 2139   10/17/18 0745  vancomycin (VANCOCIN) IVPB 1000 mg/200 mL premix     1,000 mg 200 mL/hr over 60 Minutes Intravenous 30 min pre-op 10/17/18 0728 10/17/18 0932   10/17/18 0730  ceFAZolin (ANCEF) IVPB 2g/100 mL premix  Status:  Discontinued     2 g 200 mL/hr over 30 Minutes Intravenous On call to O.R. 10/17/18 0726 10/17/18 1401       Patient was given sequential compression devices, early ambulation, and chemoprophylaxis to prevent DVT.  Patient benefited maximally from hospital stay and there were no complications.  She  did require a transfusion of PRBCs due to acute on chronic blood loss anemia.  At discharge, her hgb/hct was at her admission baseline.  Recent vital signs:  Patient Vitals for the past 24 hrs:  BP Temp Temp src Pulse Resp SpO2  10/21/18 0622 (!) 148/79 97.8 F (36.6 C) Oral 73 18 98 %  10/20/18 1838 (!) 164/79 - - 75 - 97 %  10/20/18 1306 107/68 98.8 F (37.1 C) Oral 76 16 99 %  10/20/18 0945 (!) 109/57 98.2 F (36.8 C) Oral 71 16 95 %  10/20/18 0913 (!) 119/42 98.4 F (36.9 C) Oral 75 16 98 %  10/20/18 0736 136/78 - - 81 - -     Recent laboratory studies:  Recent Labs    10/20/18 0301 10/21/18 0227  WBC 6.8 5.6  HGB 7.7* 8.7*  HCT 24.6* 27.5*  PLT 164 185     Discharge Medications:   Allergies as of 10/21/2018      Reactions   Codeine    Nauseated   Lisinopril Cough   Tramadol    Confusion      Medication List    TAKE these medications   acetaminophen 650 MG CR tablet Commonly known as: TYLENOL Take 1,300 mg by mouth 2 (two) times daily.   amoxicillin 500 MG capsule Commonly known as: AMOXIL Take 2,000 mg by mouth See admin instructions. Take 2000 mg by mouth 1 hour prior to dental appointments   atorvastatin 40 MG tablet Commonly known as: LIPITOR TAKE 1/2 (ONE-HALF) TABLET BY MOUTH ONCE  DAILY AT 6PM What changed:   how much to take  how to take this  when to take this  additional instructions   bisacodyl 5 MG EC tablet Commonly known as: DULCOLAX Take 10 mg by mouth daily.   carvedilol 6.25 MG tablet Commonly known as: COREG TAKE 1 TABLET BY MOUTH TWICE DAILY WITH A MEAL What changed:   how much to take  how to take this  when to take this  additional instructions   clopidogrel 75 MG tablet Commonly known as: PLAVIX Take 75 mg by mouth daily.   HYDROcodone-acetaminophen 5-325 MG tablet Commonly known as: NORCO/VICODIN Take 0.5 tablets by mouth every 6 (six) hours as needed for moderate pain (pain score 4-6).             Durable Medical Equipment  (From admission, onward)         Start     Ordered   10/17/18 1410  DME 3 n 1  Once     10/17/18 1409   10/17/18 1410  DME Walker rolling  Once    Question:  Patient needs a walker to treat with the following condition  Answer:  Status post total replacement of left hip   10/17/18 1409          Diagnostic Studies: Dg Pelvis Portable  Result Date: 10/17/2018 CLINICAL DATA:  Postop hip surgery. EXAM: PORTABLE PELVIS 1-2 VIEWS; OPERATIVE LEFT HIP WITH PELVIS COMPARISON:  03/24/2018. FINDINGS: Total left hip replacement with anatomic alignment. Hardware intact. Pelvic calcifications consistent phleboliths. IMPRESSION: Total left hip replacement with anatomic alignment. Electronically Signed   By: Marcello Moores  Register   On: 10/17/2018 12:45   Dg C-arm 1-60 Min-no Report  Result Date: 10/17/2018 Fluoroscopy was utilized by the requesting physician.  No radiographic interpretation.   Dg Hip Operative Unilat W Or W/o Pelvis Left  Result Date: 10/17/2018 CLINICAL DATA:  Postop hip surgery. EXAM: PORTABLE PELVIS 1-2 VIEWS; OPERATIVE LEFT HIP WITH PELVIS COMPARISON:  03/24/2018. FINDINGS: Total left hip replacement with anatomic alignment. Hardware intact. Pelvic calcifications consistent phleboliths. IMPRESSION: Total left hip replacement with anatomic alignment. Electronically Signed   By: Marcello Moores  Register   On: 10/17/2018 12:45    Disposition: Discharge disposition: 01-Home or Self Care         Follow-up Information    Mcarthur Rossetti, MD. Go on 10/30/2018.   Specialty: Orthopedic Surgery Why: at 4:00 pm for 2 week Follow up with Dr. Ninfa Linden. Contact information: [place] [address] [phone]        [facility] Follow up.   Specialty: Home Health Services Why: You have been authorized for [place] visits after discharge from the hospital. Someone from the agency will be in touch after  discharge to arrange time and date. Contact information: [address] [place] [address] [phone]            Signed: Mcarthur Rossetti 10/21/2018, 7:23 AM

## 2018-10-21 NOTE — Progress Notes (Signed)
Discharge paperwork discussed with pt and daughter at the bedside. They understood d/c instructions and pt was escorted in stable condition to main lobby by wheelchair with necessary equipment.

## 2018-10-21 NOTE — TOC Transition Note (Signed)
Transition of Care [facility]) - CM/SW Discharge Note   Patient Details  Name: LAKARA SEAVERS MRN: [id number] Date of Birth: Jul 28, 1925  [facility] of [facility] CM/SW Contact:  Leeroy Cha, RN Phone Number: 10/21/2018, 10:57 AM   Clinical Narrative:    dcd to home with hhc and dme.     Barriers to Discharge: No Barriers Identified, Barriers Resolved   Patient Goals and CMS Choice   CMS Medicare.gov Compare Post Acute Care list provided to:: Patient Choice offered to / list presented to : Patient  Discharge Placement                       Discharge Plan and Services   Discharge Planning Services: CM Consult Post Acute Care Choice: Home Health          DME Arranged: 3-N-1, Walker rolling DME Agency: [employer] Date DME Agency Contacted: 10/18/18 Time DME Agency Contacted: [phone] Representative spoke with at DME Agency: [place]: PT [place]: [facility] [employer] [facility] [employer]     Representative spoke with at [facility]: pre arranged in MD office  Social Determinants of Health ([place]) Interventions     Readmission Risk Interventions No flowsheet data found.

## 2018-10-21 NOTE — Progress Notes (Signed)
Occupational Therapy Treatment Patient Details Name: Beth Johnston MRN: [id number] DOB: 01/02/1926 Today's Date: 10/21/2018    History of present illness Pt s/p L THR and with hx of CAD, STEMI, LBBB, ischemic cardiomyopathy and bil TKR   OT comments  Pt doing well and plans to DC home with daughter this day.  Educated on ADL activity  Including shower transfer  Follow Up Recommendations  Follow surgeon's recommendation for DC plan and follow-up therapies;Supervision/Assistance - 24 hour    Equipment Recommendations  3 in 1 bedside commode    Recommendations for Other Services      Precautions / Restrictions Precautions Precautions: Fall Precaution Comments: "bad shoulders" Restrictions Weight Bearing Restrictions: No Other Position/Activity Restrictions: WBAT       Mobility Bed Mobility Overal bed mobility: Needs Assistance             General bed mobility comments: pt OOB  Transfers Overall transfer level: Needs assistance Equipment used: Rolling walker (2 wheeled) Transfers: Sit to/from [employer] Sit to Stand: Min guard Stand pivot transfers: Min guard       General transfer comment: Min guard for safety, verbal cuing for hand placement when rising. Sit to stand x2, once from recliner and once from mat in rehab gym post-stair navigation.    Balance Overall balance assessment: Needs assistance Sitting-balance support: No upper extremity supported;Feet supported Sitting balance-Leahy Scale: Fair     Standing balance support: Bilateral upper extremity supported Standing balance-Leahy Scale: Poor                             ADL either performed or assessed with clinical judgement   ADL Overall ADL's : Needs assistance/impaired Eating/Feeding: Set up;Sitting   Grooming: Set up;Sitting   Upper Body Bathing: Set up;Sitting   Lower Body Bathing: Moderate assistance;Sit to/from stand;Cueing for safety;Cueing for  compensatory techniques Lower Body Bathing Details (indicate cue type and reason): pts daugther will A Upper Body Dressing : Set up;Sitting   Lower Body Dressing: Moderate assistance;Sit to/from stand;Cueing for compensatory techniques;Cueing for safety Lower Body Dressing Details (indicate cue type and reason): daugther will A Toilet Transfer: Min guard;Cueing for safety;Ambulation   Toileting- Clothing Manipulation and Hygiene: Minimal assistance;Sit to/from stand;Cueing for compensatory techniques;Cueing for safety;Cueing for sequencing   Tub/ Shower Transfer: Walk-in shower;Minimal assistance;Cueing for sequencing;Cueing for safety     General ADL Comments: daugther will a with ADL activity as needed.  Pt doing GREAT!     Vision Patient Visual Report: No change from baseline     Perception     Praxis      Cognition Arousal/Alertness: Awake/alert Behavior During Therapy: WFL for tasks assessed/performed Overall Cognitive Status: Within Functional Limits for tasks assessed                                                General Comments Pt educated on car transfer. Pt wih step-up ledge due to tall SUV, so pt and pt's daughter educated on stepping up with LLE (although the post-operative side) facing towards front of car with HHA and holding onto interior car handle, and completing small pivot on LLE to come to sitting in car.    Pertinent Vitals/ Pain       Pain Assessment: 0-10 Pain Score: 3  Pain Location: L  hip Pain Descriptors / Indicators: Sore Pain Intervention(s): Premedicated before session;Monitored during session;Repositioned;Limited activity within patient's tolerance;Ice applied         Frequency  Min 2X/week        Progress Toward Goals  OT Goals(current goals can now be found in the care plan section)  Progress towards OT goals: Progressing toward goals  Acute Rehab OT Goals Patient Stated Goal: Walk with less pain  Plan  Discharge plan remains appropriate       AM-PAC OT "6 Clicks" Daily Activity     Outcome Measure   Help from another person eating meals?: None Help from another person taking care of personal grooming?: A Little Help from another person toileting, which includes using toliet, bedpan, or urinal?: A Lot Help from another person bathing (including washing, rinsing, drying)?: A Lot Help from another person to put on and taking off regular upper body clothing?: A Little Help from another person to put on and taking off regular lower body clothing?: A Lot 6 Click Score: 16    End of Session Equipment Utilized During Treatment: Gait belt;Rolling walker  OT Visit Diagnosis: Pain;Unsteadiness on feet (R26.81);Other abnormalities of gait and mobility (R26.89) Pain - Right/Left: Left Pain - part of body: Hip   Activity Tolerance Patient tolerated treatment well   Patient Left in chair;with call bell/phone within reach;with family/visitor present   Nurse Communication          Time: 1030-1102 OT Time Calculation (min): 32 min  Charges: OT General Charges $OT Visit: 1 Visit OT Treatments $Self Care/Home Management : 23-37 mins  Kari Baars, [place] [phone] Office- [phone]      Gurshaan Matsuoka, Edwena Felty D 10/21/2018, 2:34 PM

## 2018-10-22 ENCOUNTER — Telehealth: Payer: Self-pay | Admitting: *Deleted

## 2018-10-22 ENCOUNTER — Other Ambulatory Visit: Payer: Self-pay | Admitting: *Deleted

## 2018-10-22 DIAGNOSIS — I2510 Atherosclerotic heart disease of native coronary artery without angina pectoris: Secondary | ICD-10-CM | POA: Diagnosis not present

## 2018-10-22 DIAGNOSIS — Z471 Aftercare following joint replacement surgery: Secondary | ICD-10-CM | POA: Diagnosis not present

## 2018-10-22 DIAGNOSIS — I340 Nonrheumatic mitral (valve) insufficiency: Secondary | ICD-10-CM | POA: Diagnosis not present

## 2018-10-22 DIAGNOSIS — Z96653 Presence of artificial knee joint, bilateral: Secondary | ICD-10-CM | POA: Diagnosis not present

## 2018-10-22 DIAGNOSIS — D500 Iron deficiency anemia secondary to blood loss (chronic): Secondary | ICD-10-CM | POA: Diagnosis not present

## 2018-10-22 DIAGNOSIS — Z96642 Presence of left artificial hip joint: Secondary | ICD-10-CM | POA: Diagnosis not present

## 2018-10-22 DIAGNOSIS — M19011 Primary osteoarthritis, right shoulder: Secondary | ICD-10-CM | POA: Diagnosis not present

## 2018-10-22 DIAGNOSIS — M19012 Primary osteoarthritis, left shoulder: Secondary | ICD-10-CM | POA: Diagnosis not present

## 2018-10-22 DIAGNOSIS — I5022 Chronic systolic (congestive) heart failure: Secondary | ICD-10-CM

## 2018-10-22 DIAGNOSIS — I252 Old myocardial infarction: Secondary | ICD-10-CM | POA: Diagnosis not present

## 2018-10-22 DIAGNOSIS — J45998 Other asthma: Secondary | ICD-10-CM | POA: Diagnosis not present

## 2018-10-22 DIAGNOSIS — I255 Ischemic cardiomyopathy: Secondary | ICD-10-CM | POA: Diagnosis not present

## 2018-10-22 DIAGNOSIS — I5021 Acute systolic (congestive) heart failure: Secondary | ICD-10-CM | POA: Diagnosis not present

## 2018-10-22 DIAGNOSIS — Z9181 History of falling: Secondary | ICD-10-CM | POA: Diagnosis not present

## 2018-10-22 MED ORDER — LOSARTAN POTASSIUM 25 MG PO TABS: 25.0000 mg | ORAL_TABLET | Freq: Every day | ORAL | 3 refills | Status: DC

## 2018-10-22 MED ORDER — SPIRONOLACTONE 25 MG PO TABS: 12.5000 mg | ORAL_TABLET | Freq: Every day | ORAL | 3 refills | Status: DC

## 2018-10-22 MED ORDER — FUROSEMIDE 40 MG PO TABS: ORAL_TABLET | ORAL | 3 refills | Status: DC

## 2018-10-22 NOTE — Telephone Encounter (Signed)
I called and spoke with pt's daughter because my chart message had not been read yet. I gave daughter information in message. She reports pt was not weighed while in hospital and yesterday when weighed at home she was 170 lbs.  This is up from previous home weight prior to hospitalization of 147 lbs.  Daughter gave pt lasix yesterday and weight today is down 4 lbs.  I told her to have pt take lasix today, tomorrow and Friday morning and to call on Friday with an update on weight. Pt is also to resume aldactone and losartan. Daughter states pt is not having any shortness of breath but does look "puffy."  She will have BMP drawn at [facility] on October 1.

## 2018-10-22 NOTE — Telephone Encounter (Signed)
Thanks Electrical engineer. I agree. [name]

## 2018-10-23 ENCOUNTER — Inpatient Hospital Stay: Payer: Medicare Other | Admitting: Orthopaedic Surgery

## 2018-10-23 ENCOUNTER — Other Ambulatory Visit: Payer: Self-pay | Admitting: Cardiovascular Disease

## 2018-10-23 ENCOUNTER — Telehealth: Payer: Self-pay | Admitting: *Deleted

## 2018-10-23 NOTE — Telephone Encounter (Signed)
LVM for patient/family requesting call back to check status after discharge from hospital.

## 2018-10-24 ENCOUNTER — Telehealth: Payer: Self-pay | Admitting: *Deleted

## 2018-10-24 ENCOUNTER — Encounter: Payer: Self-pay | Admitting: Orthopaedic Surgery

## 2018-10-24 DIAGNOSIS — Z471 Aftercare following joint replacement surgery: Secondary | ICD-10-CM | POA: Diagnosis not present

## 2018-10-24 DIAGNOSIS — J45998 Other asthma: Secondary | ICD-10-CM | POA: Diagnosis not present

## 2018-10-24 DIAGNOSIS — M19011 Primary osteoarthritis, right shoulder: Secondary | ICD-10-CM | POA: Diagnosis not present

## 2018-10-24 DIAGNOSIS — I5021 Acute systolic (congestive) heart failure: Secondary | ICD-10-CM | POA: Diagnosis not present

## 2018-10-24 DIAGNOSIS — M19012 Primary osteoarthritis, left shoulder: Secondary | ICD-10-CM | POA: Diagnosis not present

## 2018-10-24 DIAGNOSIS — I255 Ischemic cardiomyopathy: Secondary | ICD-10-CM | POA: Diagnosis not present

## 2018-10-24 NOTE — Care Plan (Signed)
RNCM attempted to contact patient for a 2nd time to check status after discharge from hospital on 10/21/18. Unable to leave message on home number due to mailbox full. Called patient's daughter, Al Corpus, and she verbalized her mother is doing extremely well. Has taken very minimal pain medication since being home and is walking around with supervision without the use of a walker. Verbalized that therapy has been in the home and she is extremely pleased with HHPT she is receiving. Discussed upcoming 2 week appointment on 10/30/18 with Dr. Ninfa Linden. Will continue to follow for any further CM needs. Family and patient verbalized how pleased she is with having surgery done.

## 2018-10-24 NOTE — Telephone Encounter (Signed)
Discharge Ortho bundle call completed. 

## 2018-10-24 NOTE — Telephone Encounter (Signed)
I spoke with pt's daughter who reports pt was unable to take lasix on Wednesday due to it being late in the day.  She took lasix yesterday AM and weight this AM is 161 lbs.  Daughter will have pt continue lasix daily over the weekend but stop taking if pt gets to baseline weight of 150 lbs. She will call on Monday with an update.

## 2018-10-27 DIAGNOSIS — I255 Ischemic cardiomyopathy: Secondary | ICD-10-CM | POA: Diagnosis not present

## 2018-10-27 DIAGNOSIS — J45998 Other asthma: Secondary | ICD-10-CM | POA: Diagnosis not present

## 2018-10-27 DIAGNOSIS — M19011 Primary osteoarthritis, right shoulder: Secondary | ICD-10-CM | POA: Diagnosis not present

## 2018-10-27 DIAGNOSIS — Z471 Aftercare following joint replacement surgery: Secondary | ICD-10-CM | POA: Diagnosis not present

## 2018-10-27 DIAGNOSIS — I5021 Acute systolic (congestive) heart failure: Secondary | ICD-10-CM | POA: Diagnosis not present

## 2018-10-27 DIAGNOSIS — M19012 Primary osteoarthritis, left shoulder: Secondary | ICD-10-CM | POA: Diagnosis not present

## 2018-10-28 ENCOUNTER — Telehealth: Payer: Self-pay | Admitting: *Deleted

## 2018-10-28 NOTE — Telephone Encounter (Signed)
Left message for daughter to call or send update

## 2018-10-28 NOTE — Telephone Encounter (Signed)
I spoke with pt's daughter and told her Dr. Angelena Form would like pt to continue daily dose of Lasix.

## 2018-10-28 NOTE — Telephone Encounter (Signed)
Continue daily lasix. BMET in two days. Thanks, chris

## 2018-10-28 NOTE — Telephone Encounter (Signed)
I spoke with pt's daughter. She has given patient Lasix daily over the weekend and today.  Weight is staying 159-160 lbs.  Today's weight is 159 lbs.  BP 116/70.  Daughter reports pt's belly is slightly larger than usual.  Bowels are moving OK post surgery. No increased shortness of breath.  Activity still limited after recent hip surgery. Daughter feels some of extra weight may be due to decreased activity.  Pt has resumed Losartan and Aldactone as directed in 9/22 my chart message.   Will forward to Dr. Angelena Form to see if pt should continue daily Lasix based on current weight or change to prn. Pt is scheduled for BMP on October 1,2020

## 2018-10-28 NOTE — Telephone Encounter (Signed)
1 week Ortho bundle call completed. 

## 2018-10-28 NOTE — Telephone Encounter (Signed)
Is this weight up from her baseline at home? THanks, chris

## 2018-10-28 NOTE — Telephone Encounter (Signed)
She was 147 lbs before surgery. Upon discharge she was 170 lbs.  After first 2 doses of lasix weight went to 161 lbs.  Baseline per daughter was 150 lbs.  She has continued Lasix over the weekend and today but weight is staying at 159-160 lbs.  Daughter attributed some of this to inactivity.

## 2018-10-29 DIAGNOSIS — I255 Ischemic cardiomyopathy: Secondary | ICD-10-CM | POA: Diagnosis not present

## 2018-10-29 DIAGNOSIS — Z471 Aftercare following joint replacement surgery: Secondary | ICD-10-CM | POA: Diagnosis not present

## 2018-10-29 DIAGNOSIS — M19012 Primary osteoarthritis, left shoulder: Secondary | ICD-10-CM | POA: Diagnosis not present

## 2018-10-29 DIAGNOSIS — M19011 Primary osteoarthritis, right shoulder: Secondary | ICD-10-CM | POA: Diagnosis not present

## 2018-10-29 DIAGNOSIS — J45998 Other asthma: Secondary | ICD-10-CM | POA: Diagnosis not present

## 2018-10-29 DIAGNOSIS — I5021 Acute systolic (congestive) heart failure: Secondary | ICD-10-CM | POA: Diagnosis not present

## 2018-10-30 ENCOUNTER — Ambulatory Visit: Payer: Medicare Other | Admitting: Family Medicine

## 2018-10-30 ENCOUNTER — Ambulatory Visit (INDEPENDENT_AMBULATORY_CARE_PROVIDER_SITE_OTHER): Payer: Medicare Other | Admitting: Orthopaedic Surgery

## 2018-10-30 DIAGNOSIS — Z96642 Presence of left artificial hip joint: Secondary | ICD-10-CM

## 2018-10-30 DIAGNOSIS — I5022 Chronic systolic (congestive) heart failure: Secondary | ICD-10-CM | POA: Diagnosis not present

## 2018-10-30 NOTE — Progress Notes (Signed)
Patient is 2 weeks tomorrow status post a left total hip arthroplasty.  She is 85 years old.  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  She would like to have shoulder injections today and I agree with this.  She said her hip replacement is doing wonderful.  She is actually walking without an assistive device but I do think she needs to get around with a cane or walker.  Examination of her left operative hip her staple line looks great certainly the staples in place Steri-Strips.  Her leg lengths are equal.  She has significant grinding of the glenohumeral joint on both shoulders.  I did place a steroid injection both shoulders without difficulty today.  We will see her back in 4 weeks to see how she is doing overall from a hip standpoint and no x-rays are needed.  She knows to wait several months before steroid injections again for her shoulders which I feel should be under ultrasound in the glenohumeral joint when those next injection should be in her shoulders.  All question concerns were answered addressed.  We will see her back in 4 weeks.

## 2018-10-30 NOTE — Care Plan (Signed)
Patient seen in office today for her 2 week post-op appointment with Dr. Ninfa Linden. She ambulated into office with her daughter, without an assistive device. She verbalized she is getting around quite well at home. HHPT has seen her for a total of 4 times she states and they are coming tomorrow. No CM needs at this time. F/U per Dr. Ninfa Linden in 4 weeks.

## 2018-10-31 DIAGNOSIS — J45998 Other asthma: Secondary | ICD-10-CM | POA: Diagnosis not present

## 2018-10-31 DIAGNOSIS — I5021 Acute systolic (congestive) heart failure: Secondary | ICD-10-CM | POA: Diagnosis not present

## 2018-10-31 DIAGNOSIS — I255 Ischemic cardiomyopathy: Secondary | ICD-10-CM | POA: Diagnosis not present

## 2018-10-31 DIAGNOSIS — Z471 Aftercare following joint replacement surgery: Secondary | ICD-10-CM | POA: Diagnosis not present

## 2018-10-31 DIAGNOSIS — L821 Other seborrheic keratosis: Secondary | ICD-10-CM | POA: Diagnosis not present

## 2018-10-31 DIAGNOSIS — M19011 Primary osteoarthritis, right shoulder: Secondary | ICD-10-CM | POA: Diagnosis not present

## 2018-10-31 DIAGNOSIS — M19012 Primary osteoarthritis, left shoulder: Secondary | ICD-10-CM | POA: Diagnosis not present

## 2018-10-31 DIAGNOSIS — Z85828 Personal history of other malignant neoplasm of skin: Secondary | ICD-10-CM | POA: Diagnosis not present

## 2018-10-31 LAB — BASIC METABOLIC PANEL
BUN/Creatinine Ratio: 20 (ref 12–28)
BUN: 18 mg/dL (ref 10–36)
CO2: 25 mmol/L (ref 20–29)
Calcium: 9 mg/dL (ref 8.7–10.3)
Chloride: 105 mmol/L (ref 96–106)
Creatinine, Ser: 0.91 mg/dL (ref 0.57–1.00)
GFR calc Af Amer: 64 mL/min/{1.73_m2} (ref >59)
GFR calc non Af Amer: 56 mL/min/{1.73_m2} — ABNORMAL LOW (ref >59)
Glucose: 96 mg/dL (ref 65–99)
Potassium: 4.1 mmol/L (ref 3.5–5.2)
Sodium: 141 mmol/L (ref 134–144)

## 2018-11-03 ENCOUNTER — Other Ambulatory Visit: Payer: Self-pay | Admitting: *Deleted

## 2018-11-03 MED ORDER — FUROSEMIDE 40 MG PO TABS: 40.0000 mg | ORAL_TABLET | Freq: Every day | ORAL | 3 refills | Status: DC

## 2018-11-12 ENCOUNTER — Ambulatory Visit: Payer: Medicare Other | Admitting: Orthopaedic Surgery

## 2018-11-20 ENCOUNTER — Telehealth: Payer: Self-pay | Admitting: *Deleted

## 2018-11-20 NOTE — Telephone Encounter (Signed)
Attempted 30 day phone call to check status; left VM for patient and her daughter.

## 2018-11-21 ENCOUNTER — Telehealth: Payer: Self-pay | Admitting: *Deleted

## 2018-11-21 NOTE — Care Plan (Signed)
RNCM contacted patient for 30 day phone call and survey. Patient verbalized she is doing very well and states she is extremely pleased. Has appointment next week with Dr. Ninfa Linden on 11/27/18.  30 day Patient Satisfaction Survey: 1. Before surgery, I was provided sufficient education regarding my surgery and the bundle program. Patient answer- Strongly agree 2. I was satisfied with the care provided by the nurse at the facility where my surgery was performed. Patient answer-Strongly agree 3. Following surgery, I received sufficient postoperative care instructions. Patient answer- Strongly agree 4. I would recommend my surgeon and this bundle program to others. Patient answer- Strongly agree

## 2018-11-21 NOTE — Telephone Encounter (Signed)
30 day ortho bundle call and survey completed.

## 2018-11-27 ENCOUNTER — Other Ambulatory Visit: Payer: Self-pay

## 2018-11-27 ENCOUNTER — Encounter: Payer: Self-pay | Admitting: Orthopaedic Surgery

## 2018-11-27 ENCOUNTER — Ambulatory Visit (INDEPENDENT_AMBULATORY_CARE_PROVIDER_SITE_OTHER): Payer: Medicare Other | Admitting: Orthopaedic Surgery

## 2018-11-27 DIAGNOSIS — M25511 Pain in right shoulder: Secondary | ICD-10-CM

## 2018-11-27 DIAGNOSIS — M25512 Pain in left shoulder: Secondary | ICD-10-CM

## 2018-11-27 DIAGNOSIS — Z96642 Presence of left artificial hip joint: Secondary | ICD-10-CM

## 2018-11-27 DIAGNOSIS — G8929 Other chronic pain: Secondary | ICD-10-CM

## 2018-11-27 NOTE — Progress Notes (Signed)
The patient comes in today now 6-week status post a right total hip arthroplasty.  She is 85 years old.  She said she is doing wonderful and is very pleased.  She is not walking with any assistive device.  She is still dealing with chronic pain in both shoulders with severe osteoarthritis of both shoulders.  We did inject both shoulders at her last visit so she understands a steroid inject her shoulders again.  On examination her leg lengths are equal.  Her left hip moves smoothly and fluidly.  Her incision on the left side is well-healed.  At this point we do not need to see her back for 2 months.  No x-rays are needed at that visit but we are only seeing her at that visit to likely place steroid injections again in both shoulders.  She is not interested in any type of referral for shoulder replacement surgery as of yet.  All question concerns were answered and addressed.

## 2018-12-09 ENCOUNTER — Telehealth: Payer: Self-pay | Admitting: Orthopaedic Surgery

## 2018-12-09 NOTE — Telephone Encounter (Signed)
Let her know that is normal following hip surgery because we stretch the cutaneous nerves that provide sensation over her hip area and operative site.  That will improve with time.

## 2018-12-09 NOTE — Telephone Encounter (Signed)
Patient called wanting to know if her hip should feel like pins and needles every time she gets up and down.  CB#[phone].  Thank you.

## 2018-12-09 NOTE — Telephone Encounter (Signed)
Pt aware of below message. 

## 2018-12-09 NOTE — Telephone Encounter (Signed)
See below

## 2018-12-31 DIAGNOSIS — I2510 Atherosclerotic heart disease of native coronary artery without angina pectoris: Secondary | ICD-10-CM | POA: Diagnosis not present

## 2018-12-31 DIAGNOSIS — E7849 Other hyperlipidemia: Secondary | ICD-10-CM | POA: Diagnosis not present

## 2018-12-31 NOTE — Progress Notes (Signed)
Virtual Visit via Video Note   This visit type was conducted due to national recommendations for restrictions regarding the COVID-19 Pandemic (e.g. social distancing) in an effort to limit this patient's exposure and mitigate transmission in our community.  Due to her co-morbid illnesses, this patient is at least at moderate risk for complications without adequate follow up.  This format is felt to be most appropriate for this patient at this time.  All issues noted in this document were discussed and addressed.  A limited physical exam was performed with this format.  Please refer to the patient's chart for her consent to telehealth for [facility].   Date:  01/02/2019   ID:  Beth Johnston, DOB 10-Jan-1926, MRN [id number]  Patient Location: Home Provider Location: Office  PCP:  Patient, No Pcp Per  Cardiologist:  Lauree Chandler, MD  Electrophysiologist:  None   Evaluation Performed:  Follow-Up Visit  Chief Complaint:  Follow up- CAD  History of Present Illness:    Beth Johnston is a 85 y.o. female with history of CAD, ischemic cardiomyopathy, LBBB, mitral regurgitation and aortic stenosis who is being seen today by virtual e-visit due to the [place] pandemic. She was admitted to [facility] in November of 2015 with a NSTEMI and was found to have severe calcific disease in the LAD by cath 12/28/13. LVEF was 10%. She refused CABG and underwent high risk PCI/rotablator atherectomy of the LAD with Impella LV support. 2 drug eluting stents wereplacedin the LAD. Echo May 2019with LVEF=30-35%, mild to moderate AS, mild MR. She has been on Losartan. This was changed due to backorder of Losartan and she did not feel well while on Telmisartan. She is now back on Losartan and doing well.   The patient denies chest pain, dyspnea, palpitations, dizziness, near syncope or syncope. No lower extremity edema. She did well with her hip replacement. She is feeling great.   The patient does have  symptoms concerning for COVID-19 infection (fever, chills, cough, or new shortness of breath).    Past Medical History:  Diagnosis Date  . Arthritis    "mostly my hands" (12/25/2013)  . Asthma   . Ischemic cardiomyopathy    a. EF 15%  . PONV (postoperative nausea and vomiting)   . Upper airway cough syndrome    Post-nasal drip   Past Surgical History:  Procedure Laterality Date  . APPENDECTOMY  1960's  . CARDIAC CATHETERIZATION  12/28/2013   Procedure: RIGHT/LEFT HEART CATH AND CORONARY ANGIOGRAPHY;  Surgeon: Troy Sine, MD;  Location: [facility] CATH LAB;  Service: Cardiovascular;;  . CYSTOCELE REPAIR  12/18/2011   Procedure: ANTERIOR REPAIR (CYSTOCELE);  Surgeon: Gus Height, MD;  Location: [facility];  Service: Gynecology;  Laterality: N/A;  . JOINT REPLACEMENT    . JOINT REPLACEMENT Right 1980's   "ring finger"  . PERCUTANEOUS CORONARY STENT INTERVENTION (PCI-S) N/A 12/30/2013   Procedure: PERCUTANEOUS CORONARY STENT INTERVENTION (PCI-S);  Surgeon: Burnell Blanks, MD;  Location: [facility] CATH LAB;  Service: Cardiovascular;  Laterality: N/A;  . REPLACEMENT TOTAL KNEE BILATERAL Bilateral 2009  . TOTAL HIP ARTHROPLASTY Left 10/17/2018   Procedure: LEFT TOTAL HIP ARTHROPLASTY ANTERIOR APPROACH;  Surgeon: Mcarthur Rossetti, MD;  Location: [facility];  Service: Orthopedics;  Laterality: Left;     Current Meds  Medication Sig  . acetaminophen (TYLENOL) 650 MG CR tablet Take 1,300 mg by mouth 2 (two) times daily.   Marland Kitchen amoxicillin (AMOXIL) 500 MG capsule Take 2,000 mg  by mouth See admin instructions. Take 2000 mg by mouth 1 hour prior to dental appointments  . atorvastatin (LIPITOR) 40 MG tablet Take 20 mg by mouth daily.  . bisacodyl (DULCOLAX) 5 MG EC tablet Take 10 mg by mouth daily.   . carvedilol (COREG) 6.25 MG tablet Take 6.25 mg by mouth 2 (two) times daily with a meal.  . clopidogrel (PLAVIX) 75 MG tablet TAKE 1 TABLET BY MOUTH  DAILY WITH BREAKFAST  . furosemide (LASIX) 40 MG  tablet Take 1 tablet (40 mg total) by mouth daily.  Marland Kitchen HYDROcodone-acetaminophen (NORCO/VICODIN) 5-325 MG tablet Take 0.5 tablets by mouth every 6 (six) hours as needed for moderate pain (pain score 4-6).  Marland Kitchen losartan (COZAAR) 25 MG tablet Take 1 tablet (25 mg total) by mouth daily.  . ondansetron (ZOFRAN ODT) 4 MG disintegrating tablet Take 1 tablet (4 mg total) by mouth every 8 (eight) hours as needed for nausea or vomiting.  Marland Kitchen spironolactone (ALDACTONE) 25 MG tablet Take 0.5 tablets (12.5 mg total) by mouth daily.     Allergies:   Codeine, Lisinopril, and Tramadol   Social History   Tobacco Use  . Smoking status: Never Smoker  . Smokeless tobacco: Never Used  Substance Use Topics  . Alcohol use: No  . Drug use: No     Family Hx: The patient's family history includes Cerebral aneurysm in her mother; Healthy in her father.  ROS:   Please see the history of present illness.    All other systems reviewed and are negative.   Prior CV studies:   The following studies were reviewed today:  Echo May 2019: - Left ventricle: The cavity size was normal. There was moderate concentric hypertrophy. Systolic function was moderately to severely reduced. The estimated ejection fraction was in the range of 30% to 35%. There was dynamic obstruction, with a peak velocity of 390 cm/sec and a peak gradient of 61 mm Hg. Akinesis of the basal-midanteroseptal, inferior, and inferoseptal myocardium. Left ventricular diastolic function parameters were normal. Doppler parameters are consistent with indeterminate ventricular filling pressure. - Aortic valve: There was mild stenosis. Peak velocity (S): 238 cm/s. Mean gradient (S): 12 mm Hg. - Mitral valve: Transvalvular velocity was within the normal range. There was no evidence for stenosis. There was mild regurgitation. - Left atrium: The atrium was moderately dilated. - Right ventricle: The cavity size was normal. Wall  thickness was normal. Systolic function was normal. - Tricuspid valve: There was trivial regurgitation.  Labs/Other Tests and Data Reviewed:    EKG:  No ECG reviewed.  Recent Labs: 10/21/2018: Hemoglobin 8.7; Platelets 185 12/31/2018: ALT 16; BUN 21; Creatinine, Ser 1.05; Potassium 5.0; Sodium 143   Recent Lipid Panel Lab Results  Component Value Date/Time   CHOL 144 12/31/2018 11:23 AM   TRIG 83 12/31/2018 11:23 AM   HDL 56 12/31/2018 11:23 AM   CHOLHDL 2.6 12/31/2018 11:23 AM   CHOLHDL 2.2 01/04/2016 10:58 AM   LDLCALC 72 12/31/2018 11:23 AM    Wt Readings from Last 3 Encounters:  01/02/19 150 lb (68 kg)  10/17/18 153 lb 3.2 oz (69.5 kg)  09/23/18 148 lb (67.1 kg)     Objective:    Vital Signs:  BP 119/66   Pulse 88   Ht 5' 4.5" (1.638 m)   Wt 150 lb (68 kg)   SpO2 99%   BMI 25.35 kg/m    GEN:  no acute distress  ASSESSMENT & PLAN:    1. CAD  without angina: She has no chest pain. Continue beta blocker, Plavix and statin.   2. Ischemic cardiomyopathy:LVEF 30-35% by echo May 2019.This is unchanged. Continue beta blocker and ARB  3. Chronic systolic [id number] is stable at home per pt. Continue Lasix but she will begin to use several days per week.   4. LBBB: Chronic  5. Mitral regurgitation: Mild by echo in May 2019. Will repeat echo in May 2021.   6.Aortic stenosis:She had moderate AS by echo in May 2019 but likely low gradient/low output severe AS due to LV systolic dysfunction. She is asymptomatic. Repeat echo May 2021.   7. Hyperlipidemia:LDL near goal. Continue statin  8. [id number] LVH on echo May 2019.   9. Cough:She has a constant cough for years. She has nasal drainage constantly. I think her cough is from post nasal drip. NO improvement when holding ARB in 2019.    COVID-19 Education: The signs and symptoms of COVID-19 were discussed with the patient and how to seek care for testing (follow up with PCP or arrange E-visit).  The  importance of social distancing was discussed today.  Time:   Today, I have spent 15 minutes with the patient with telehealth technology discussing the above problems.     Medication Adjustments/Labs and Tests Ordered: Current medicines are reviewed at length with the patient today.  Concerns regarding medicines are outlined above.   Tests Ordered: No orders of the defined types were placed in this encounter.   Medication Changes: No orders of the defined types were placed in this encounter.   Disposition:  Follow up in 6 month(s)  Signed, Lauree Chandler, MD  01/02/2019 10:08 AM    [facility]

## 2019-01-01 LAB — COMPREHENSIVE METABOLIC PANEL
ALT: 16 IU/L (ref 0–32)
AST: 20 IU/L (ref 0–40)
Albumin/Globulin Ratio: 2.3 — ABNORMAL HIGH (ref 1.2–2.2)
Albumin: 4.4 g/dL (ref 3.5–4.6)
Alkaline Phosphatase: 130 IU/L — ABNORMAL HIGH (ref 39–117)
BUN/Creatinine Ratio: 20 (ref 12–28)
BUN: 21 mg/dL (ref 10–36)
Bilirubin Total: 0.7 mg/dL (ref 0.0–1.2)
CO2: 22 mmol/L (ref 20–29)
Calcium: 9.7 mg/dL (ref 8.7–10.3)
Chloride: 108 mmol/L — ABNORMAL HIGH (ref 96–106)
Creatinine, Ser: 1.05 mg/dL — ABNORMAL HIGH (ref 0.57–1.00)
GFR calc Af Amer: 54 mL/min/{1.73_m2} — ABNORMAL LOW (ref >59)
GFR calc non Af Amer: 47 mL/min/{1.73_m2} — ABNORMAL LOW (ref >59)
Globulin, Total: 1.9 g/dL (ref 1.5–4.5)
Glucose: 102 mg/dL — ABNORMAL HIGH (ref 65–99)
Potassium: 5 mmol/L (ref 3.5–5.2)
Sodium: 143 mmol/L (ref 134–144)
Total Protein: 6.3 g/dL (ref 6.0–8.5)

## 2019-01-01 LAB — LIPID PANEL
Chol/HDL Ratio: 2.6 ratio (ref 0.0–4.4)
Cholesterol, Total: 144 mg/dL (ref 100–199)
HDL: 56 mg/dL (ref >39)
LDL Chol Calc (NIH): 72 mg/dL (ref 0–99)
Triglycerides: 83 mg/dL (ref 0–149)
VLDL Cholesterol Cal: 16 mg/dL (ref 5–40)

## 2019-01-02 ENCOUNTER — Encounter: Payer: Self-pay | Admitting: Cardiovascular Disease

## 2019-01-02 ENCOUNTER — Telehealth (INDEPENDENT_AMBULATORY_CARE_PROVIDER_SITE_OTHER): Payer: Medicare Other | Admitting: Cardiovascular Disease

## 2019-01-02 ENCOUNTER — Other Ambulatory Visit: Payer: Self-pay

## 2019-01-02 VITALS — BP 119/66 | HR 88 | Ht 64.5 in | Wt 150.0 lb

## 2019-01-02 DIAGNOSIS — I350 Nonrheumatic aortic (valve) stenosis: Secondary | ICD-10-CM

## 2019-01-02 DIAGNOSIS — E7849 Other hyperlipidemia: Secondary | ICD-10-CM | POA: Diagnosis not present

## 2019-01-02 DIAGNOSIS — I340 Nonrheumatic mitral (valve) insufficiency: Secondary | ICD-10-CM | POA: Diagnosis not present

## 2019-01-02 DIAGNOSIS — I2510 Atherosclerotic heart disease of native coronary artery without angina pectoris: Secondary | ICD-10-CM

## 2019-01-02 DIAGNOSIS — I5022 Chronic systolic (congestive) heart failure: Secondary | ICD-10-CM | POA: Diagnosis not present

## 2019-01-02 DIAGNOSIS — I255 Ischemic cardiomyopathy: Secondary | ICD-10-CM

## 2019-01-02 DIAGNOSIS — I447 Left bundle-branch block, unspecified: Secondary | ICD-10-CM | POA: Diagnosis not present

## 2019-01-02 NOTE — Patient Instructions (Signed)
Medication Instructions:  No changes *If you need a refill on your cardiac medications before your next appointment, please call your pharmacy*  Lab Work: none If you have labs (blood work) drawn today and your tests are completely normal, you will receive your results only by: Marland Kitchen MyChart Message (if you have MyChart) OR . A paper copy in the mail If you have any lab test that is abnormal or we need to change your treatment, we will call you to review the results.  Testing/Procedures: none  Follow-Up: At [facility], you and your health needs are our priority.  As part of our continuing mission to provide you with exceptional heart care, we have created designated Provider Care Teams.  These Care Teams include your primary Cardiologist (physician) and Advanced Practice Providers (APPs -  Physician Assistants and Nurse Practitioners) who all work together to provide you with the care you need, when you need it.  Your next appointment:   6 month(s)  The format for your next appointment:   Either In Person or Virtual  Provider:   Lauree Chandler, MD  Other Instructions

## 2019-01-12 ENCOUNTER — Other Ambulatory Visit: Payer: Self-pay | Admitting: Cardiovascular Disease

## 2019-01-21 ENCOUNTER — Telehealth: Payer: Self-pay | Admitting: *Deleted

## 2019-01-21 NOTE — Telephone Encounter (Signed)
90 day Ortho bundle call completed. 

## 2019-01-21 NOTE — Care Plan (Signed)
Call to patient to check status for 90 day post-op call. Beth Johnston. Survey reviewed. Patient reports she is doing very well and having no issues. Does want to discuss her shoulders and increased pain with Dr. Ninfa Linden at her visit next week.

## 2019-01-27 ENCOUNTER — Ambulatory Visit: Payer: Medicare Other | Admitting: Orthopaedic Surgery

## 2019-02-04 ENCOUNTER — Other Ambulatory Visit: Payer: Self-pay

## 2019-02-04 ENCOUNTER — Ambulatory Visit (INDEPENDENT_AMBULATORY_CARE_PROVIDER_SITE_OTHER): Payer: Medicare Other | Admitting: Orthopaedic Surgery

## 2019-02-04 ENCOUNTER — Encounter: Payer: Self-pay | Admitting: Orthopaedic Surgery

## 2019-02-04 DIAGNOSIS — M25512 Pain in left shoulder: Secondary | ICD-10-CM | POA: Diagnosis not present

## 2019-02-04 DIAGNOSIS — G8929 Other chronic pain: Secondary | ICD-10-CM

## 2019-02-04 DIAGNOSIS — M25511 Pain in right shoulder: Secondary | ICD-10-CM | POA: Diagnosis not present

## 2019-02-04 DIAGNOSIS — Z96642 Presence of left artificial hip joint: Secondary | ICD-10-CM

## 2019-02-04 MED ORDER — METHYLPREDNISOLONE ACETATE 40 MG/ML IJ SUSP
40.0000 mg | INTRAMUSCULAR | Status: AC | PRN
  Administered 2019-02-04: 14:00:00 40 mg via INTRA_ARTICULAR

## 2019-02-04 MED ORDER — LIDOCAINE HCL 1 % IJ SOLN
3.0000 mL | INTRAMUSCULAR | Status: AC | PRN
  Administered 2019-02-04: 14:00:00 3 mL

## 2019-02-04 MED ORDER — LIDOCAINE HCL 1 % IJ SOLN
3.0000 mL | INTRAMUSCULAR | Status: AC | PRN
  Administered 2019-02-04: 3 mL

## 2019-02-04 NOTE — Progress Notes (Signed)
Office Visit Note   Patient: Beth Johnston           Date of Birth: 12-24-1925           MRN: [id number] Visit Date: 02/04/2019              Requested by: No referring provider defined for this encounter. PCP: Patient, No Pcp Per   Assessment & Plan: Visit Diagnoses:  1. Status post total replacement of left hip   2. Chronic pain of both shoulders     Plan: I did inject both shoulders today per her request since it has been over 3 months.  At this point follow-up can really be as needed since she is doing so well and given her age of 86 I would not recommend anything else for now.  She knows that she can get steroid injections in 3 months from now if needed in her shoulders.  All question concerns were answered addressed.  Follow-up as otherwise as needed for her hip as well.  Follow-Up Instructions: Return if symptoms worsen or fail to improve.   Orders:  Orders Placed This Encounter  Procedures  . Large Joint Inj   No orders of the defined types were placed in this encounter.     Procedures: Large Joint Inj: bilateral subacromial bursa on 02/04/2019 2:23 PM Indications: pain and diagnostic evaluation Details: 22 G 1.5 in needle  Arthrogram: No  Medications (Right): 3 mL lidocaine 1 %; 40 mg methylPREDNISolone acetate 40 MG/ML Medications (Left): 3 mL lidocaine 1 %; 40 mg methylPREDNISolone acetate 40 MG/ML Outcome: tolerated well, no immediate complications Procedure, treatment alternatives, risks and benefits explained, specific risks discussed. Consent was given by the patient. Immediately prior to procedure a time out was called to verify the correct patient, procedure, equipment, support staff and site/side marked as required. Patient was prepped and draped in the usual sterile fashion.       Clinical Data: No additional findings.   Subjective: Chief Complaint  Patient presents with  . Left Hip - Follow-up  The patient is now 3 months status post a left  total hip arthroplasty.  She is 86 years old very active and says she is doing great.  She does have severe osteoarthritis in both shoulders.  It has been 3 months since we have injected her shoulders.  She would like [name] to inject them today.  Overall she is walking without assistive device and doing very well.  HPI  Review of Systems She currently denies any headache, chest pain, shortness of breath, fever, chills, nausea, vomiting  Objective: Vital Signs: There were no vitals taken for this visit.  Physical Exam She is alert and orient x3 and in no acute distress Ortho Exam Examination of her left hip shows it moves smoothly and fluidly.  Her leg lengths are equal.  Both shoulders have severe limitations of motion secondary to rotator cuff arthropathy and arthritis. Specialty Comments:  No specialty comments available.  Imaging: No results found.   PMFS History: Patient Active Problem List   Diagnosis Date Noted  . Status post total replacement of left hip 10/17/2018  . Unilateral primary osteoarthritis, left hip 03/24/2018  . Chronic pain of both shoulders 02/27/2016  . Acute heart failure ([place])   . Elevated troponin   . Stented coronary artery   . Acute respiratory failure ([place]) 01/02/2014  . CAD S/P high risk LAD PCI/DES 12/30/13 01/02/2014  . Pseudoaneurysm following PCI 01/02/2014  . Blood  loss anemia 01/02/2014  . Acute systolic CHF (congestive heart failure) ([place])   . NSTEMI (non-ST elevated myocardial infarction) ([place]) 12/26/2013  . Mitral regurgitation 12/26/2013  . Cardiomyopathy, ischemic-EF 15%-pre PCI 12/26/2013  . LBBB (left bundle branch block) 12/25/2013  . Asthma, persistent controlled 05/28/2013  . Upper airway cough syndrome 05/28/2013  . Cough 05/11/2013   Past Medical History:  Diagnosis Date  . Arthritis    "mostly my hands" (12/25/2013)  . Asthma   . Ischemic cardiomyopathy    a. EF 15%  . PONV (postoperative nausea and vomiting)   . Upper  airway cough syndrome    Post-nasal drip    Family History  Problem Relation Age of Onset  . Cerebral aneurysm Mother        died at age 19  . Healthy Father        died of old age    Past Surgical History:  Procedure Laterality Date  . APPENDECTOMY  1960's  . CARDIAC CATHETERIZATION  12/28/2013   Procedure: RIGHT/LEFT HEART CATH AND CORONARY ANGIOGRAPHY;  Surgeon: Troy Sine, MD;  Location: [facility] CATH LAB;  Service: Cardiovascular;;  . CYSTOCELE REPAIR  12/18/2011   Procedure: ANTERIOR REPAIR (CYSTOCELE);  Surgeon: Gus Height, MD;  Location: [facility];  Service: Gynecology;  Laterality: N/A;  . JOINT REPLACEMENT    . JOINT REPLACEMENT Right 1980's   "ring finger"  . PERCUTANEOUS CORONARY STENT INTERVENTION (PCI-S) N/A 12/30/2013   Procedure: PERCUTANEOUS CORONARY STENT INTERVENTION (PCI-S);  Surgeon: Burnell Blanks, MD;  Location: [facility] CATH LAB;  Service: Cardiovascular;  Laterality: N/A;  . REPLACEMENT TOTAL KNEE BILATERAL Bilateral 2009  . TOTAL HIP ARTHROPLASTY Left 10/17/2018   Procedure: LEFT TOTAL HIP ARTHROPLASTY ANTERIOR APPROACH;  Surgeon: Mcarthur Rossetti, MD;  Location: [facility];  Service: Orthopedics;  Laterality: Left;   Social History   Occupational History  . Not on file  Tobacco Use  . Smoking status: Never Smoker  . Smokeless tobacco: Never Used  Substance and Sexual Activity  . Alcohol use: No  . Drug use: No  . Sexual activity: Not on file

## 2019-05-20 ENCOUNTER — Ambulatory Visit (INDEPENDENT_AMBULATORY_CARE_PROVIDER_SITE_OTHER): Payer: Medicare Other | Admitting: Orthopaedic Surgery

## 2019-05-20 ENCOUNTER — Other Ambulatory Visit: Payer: Self-pay

## 2019-05-20 ENCOUNTER — Encounter: Payer: Self-pay | Admitting: Orthopaedic Surgery

## 2019-05-20 DIAGNOSIS — M25511 Pain in right shoulder: Secondary | ICD-10-CM | POA: Diagnosis not present

## 2019-05-20 DIAGNOSIS — M25512 Pain in left shoulder: Secondary | ICD-10-CM | POA: Diagnosis not present

## 2019-05-20 DIAGNOSIS — G8929 Other chronic pain: Secondary | ICD-10-CM | POA: Diagnosis not present

## 2019-05-20 DIAGNOSIS — Z96642 Presence of left artificial hip joint: Secondary | ICD-10-CM

## 2019-05-20 MED ORDER — METHYLPREDNISOLONE ACETATE 40 MG/ML IJ SUSP
40.0000 mg | INTRAMUSCULAR | Status: AC | PRN
  Administered 2019-05-20: 14:00:00 40 mg via INTRA_ARTICULAR

## 2019-05-20 MED ORDER — LIDOCAINE HCL 1 % IJ SOLN
3.0000 mL | INTRAMUSCULAR | Status: AC | PRN
  Administered 2019-05-20: 3 mL

## 2019-05-20 MED ORDER — METHYLPREDNISOLONE ACETATE 40 MG/ML IJ SUSP
40.0000 mg | INTRAMUSCULAR | Status: AC | PRN
  Administered 2019-05-20: 40 mg via INTRA_ARTICULAR

## 2019-05-20 MED ORDER — LIDOCAINE HCL 1 % IJ SOLN
3.0000 mL | INTRAMUSCULAR | Status: AC | PRN
  Administered 2019-05-20: 14:00:00 3 mL

## 2019-05-20 NOTE — Progress Notes (Signed)
Office Visit Note   Patient: Beth Johnston           Date of Birth: [birth year]           MRN: [id number] Visit Date: 05/20/2019              Requested by: No referring provider defined for this encounter. PCP: Patient, No Pcp Per   Assessment & Plan: Visit Diagnoses:  1. Status post total replacement of left hip   2. Chronic pain of both shoulders     Plan: Per the patient's request I did provide a steroid injection in both shoulders at the subacromial outlet which she did tolerate well.  She knows to always wait over 3 months before injections again.  All questions and concerns were answered and addressed.  The hip is doing well.  Follow-up will still be as needed fully expect to see her back in late July early August of this year for repeat steroid injections in her shoulders.  Follow-Up Instructions: Return if symptoms worsen or fail to improve.   Orders:  Orders Placed This Encounter  Procedures  . Large Joint Inj   No orders of the defined types were placed in this encounter.     Procedures: Large Joint Inj: bilateral subacromial bursa on 05/20/2019 1:43 PM Indications: pain and diagnostic evaluation Details: 22 G 1.5 in needle  Arthrogram: No  Medications (Right): 3 mL lidocaine 1 %; 40 mg methylPREDNISolone acetate 40 MG/ML Medications (Left): 3 mL lidocaine 1 %; 40 mg methylPREDNISolone acetate 40 MG/ML Outcome: tolerated well, no immediate complications Procedure, treatment alternatives, risks and benefits explained, specific risks discussed. Consent was given by the patient. Immediately prior to procedure a time out was called to verify the correct patient, procedure, equipment, support staff and site/side marked as required. Patient was prepped and draped in the usual sterile fashion.       Clinical Data: No additional findings.   Subjective: Chief Complaint  Patient presents with  . Left Hip - Follow-up  . Left Shoulder - Follow-up  . Right  Shoulder - Follow-up  The patient is an active 86 year old patient of ours who we replaced her left hip last year.  She states that the hip is doing wonderful.  She does have known end-stage arthritis of both shoulders with rotator cuff arthropathy.  She is not interested in any type of surgical intervention.  She always comes in between 3 and 4 months for steroid injections which do help somewhat temporize her pain.  She has had no other acute change in her medical status recently.  She is requesting steroid injections today in both shoulders.  We last did this just over 3 months ago.  She is diabetic but reports good control. HPI  Review of Systems She currently denies any headache, chest pain, shortness of breath, fever, chills, nausea, vomiting  Objective: Vital Signs: There were no vitals taken for this visit.  Physical Exam She is alert and orient x3 and in no acute distress Ortho Exam Examination of both her shoulder shows significant grinding at the glenohumeral joint with evidence of weakness of the rotator cuff and rotator cuff arthropathy.  Her left operative hip moves smoothly. Specialty Comments:  No specialty comments available.  Imaging: No results found.   PMFS History: Patient Active Problem List   Diagnosis Date Noted  . Status post total replacement of left hip 10/17/2018  . Unilateral primary osteoarthritis, left hip 03/24/2018  . Chronic pain of  both shoulders 02/27/2016  . Acute heart failure ([place])   . Elevated troponin   . Stented coronary artery   . Acute respiratory failure ([place]) 01/02/2014  . CAD S/P high risk LAD PCI/DES 12/30/13 01/02/2014  . Pseudoaneurysm following PCI 01/02/2014  . Blood loss anemia 01/02/2014  . Acute systolic CHF (congestive heart failure) ([place])   . NSTEMI (non-ST elevated myocardial infarction) ([place]) 12/26/2013  . Mitral regurgitation 12/26/2013  . Cardiomyopathy, ischemic-EF 15%-pre PCI 12/26/2013  . LBBB (left bundle branch  block) 12/25/2013  . Asthma, persistent controlled 05/28/2013  . Upper airway cough syndrome 05/28/2013  . Cough 05/11/2013   Past Medical History:  Diagnosis Date  . Arthritis    "mostly my hands" (12/25/2013)  . Asthma   . Ischemic cardiomyopathy    a. EF 15%  . PONV (postoperative nausea and vomiting)   . Upper airway cough syndrome    Post-nasal drip    Family History  Problem Relation Age of Onset  . Cerebral aneurysm Mother        died at age 15  . Healthy Father        died of old age    Past Surgical History:  Procedure Laterality Date  . APPENDECTOMY  1960's  . CARDIAC CATHETERIZATION  12/28/2013   Procedure: RIGHT/LEFT HEART CATH AND CORONARY ANGIOGRAPHY;  Surgeon: Troy Sine, MD;  Location: [facility] CATH LAB;  Service: Cardiovascular;;  . CYSTOCELE REPAIR  12/18/2011   Procedure: ANTERIOR REPAIR (CYSTOCELE);  Surgeon: Gus Height, MD;  Location: [facility];  Service: Gynecology;  Laterality: N/A;  . JOINT REPLACEMENT    . JOINT REPLACEMENT Right 1980's   "ring finger"  . PERCUTANEOUS CORONARY STENT INTERVENTION (PCI-S) N/A 12/30/2013   Procedure: PERCUTANEOUS CORONARY STENT INTERVENTION (PCI-S);  Surgeon: Burnell Blanks, MD;  Location: [facility] CATH LAB;  Service: Cardiovascular;  Laterality: N/A;  . REPLACEMENT TOTAL KNEE BILATERAL Bilateral 2009  . TOTAL HIP ARTHROPLASTY Left 10/17/2018   Procedure: LEFT TOTAL HIP ARTHROPLASTY ANTERIOR APPROACH;  Surgeon: Mcarthur Rossetti, MD;  Location: [facility];  Service: Orthopedics;  Laterality: Left;   Social History   Occupational History  . Not on file  Tobacco Use  . Smoking status: Never Smoker  . Smokeless tobacco: Never Used  Substance and Sexual Activity  . Alcohol use: No  . Drug use: No  . Sexual activity: Not on file

## 2019-06-03 ENCOUNTER — Telehealth: Payer: Self-pay | Admitting: Cardiovascular Disease

## 2019-06-03 DIAGNOSIS — I2510 Atherosclerotic heart disease of native coronary artery without angina pectoris: Secondary | ICD-10-CM

## 2019-06-03 NOTE — Telephone Encounter (Signed)
Patient's daughter calling requesting the patient have lab work done before her appointment.

## 2019-06-12 NOTE — Telephone Encounter (Signed)
Okay to place orders for lipid/liver/bmet/cbc?

## 2019-06-14 NOTE — Telephone Encounter (Signed)
Good with me. Thanks, chris

## 2019-06-23 ENCOUNTER — Other Ambulatory Visit: Payer: Self-pay | Admitting: Cardiovascular Disease

## 2019-06-24 DIAGNOSIS — H43393 Other vitreous opacities, bilateral: Secondary | ICD-10-CM | POA: Diagnosis not present

## 2019-07-22 ENCOUNTER — Other Ambulatory Visit: Payer: Medicare Other

## 2019-07-23 ENCOUNTER — Other Ambulatory Visit: Payer: Self-pay

## 2019-07-23 ENCOUNTER — Other Ambulatory Visit: Payer: Medicare Other | Admitting: *Deleted

## 2019-07-23 DIAGNOSIS — I2510 Atherosclerotic heart disease of native coronary artery without angina pectoris: Secondary | ICD-10-CM

## 2019-07-23 LAB — BASIC METABOLIC PANEL
BUN/Creatinine Ratio: 31 — ABNORMAL HIGH (ref 12–28)
BUN: 39 mg/dL — ABNORMAL HIGH (ref 10–36)
CO2: 21 mmol/L (ref 20–29)
Calcium: 9.6 mg/dL (ref 8.7–10.3)
Chloride: 105 mmol/L (ref 96–106)
Creatinine, Ser: 1.25 mg/dL — ABNORMAL HIGH (ref 0.57–1.00)
GFR calc Af Amer: 43 mL/min/{1.73_m2} — ABNORMAL LOW (ref >59)
GFR calc non Af Amer: 38 mL/min/{1.73_m2} — ABNORMAL LOW (ref >59)
Glucose: 99 mg/dL (ref 65–99)
Potassium: 5.3 mmol/L — ABNORMAL HIGH (ref 3.5–5.2)
Sodium: 143 mmol/L (ref 134–144)

## 2019-07-23 LAB — CBC
Hematocrit: 32 % — ABNORMAL LOW (ref 34.0–46.6)
Hemoglobin: 10.4 g/dL — ABNORMAL LOW (ref 11.1–15.9)
MCH: 27.9 pg (ref 26.6–33.0)
MCHC: 32.5 g/dL (ref 31.5–35.7)
MCV: 86 fL (ref 79–97)
Platelets: 217 10*3/uL (ref 150–450)
RBC: 3.73 x10E6/uL — ABNORMAL LOW (ref 3.77–5.28)
RDW: 12.3 % (ref 11.7–15.4)
WBC: 5.3 10*3/uL (ref 3.4–10.8)

## 2019-07-23 LAB — LIPID PANEL
Chol/HDL Ratio: 2.8 ratio (ref 0.0–4.4)
Cholesterol, Total: 131 mg/dL (ref 100–199)
HDL: 46 mg/dL (ref >39)
LDL Chol Calc (NIH): 65 mg/dL (ref 0–99)
Triglycerides: 110 mg/dL (ref 0–149)
VLDL Cholesterol Cal: 20 mg/dL (ref 5–40)

## 2019-07-23 LAB — HEPATIC FUNCTION PANEL
ALT: 11 IU/L (ref 0–32)
AST: 20 IU/L (ref 0–40)
Albumin: 4.2 g/dL (ref 3.5–4.6)
Alkaline Phosphatase: 89 IU/L (ref 48–121)
Bilirubin Total: 0.6 mg/dL (ref 0.0–1.2)
Bilirubin, Direct: 0.16 mg/dL (ref 0.00–0.40)
Total Protein: 6.4 g/dL (ref 6.0–8.5)

## 2019-07-24 ENCOUNTER — Telehealth: Payer: Self-pay | Admitting: *Deleted

## 2019-07-24 NOTE — Telephone Encounter (Signed)
Speaking w patient's daughter re lab results. She asks about whether patient can add ONE advil per day to her meds. Both of her rotator cuffs are in poor condition and she is in constant pain.  She takes 4 tylenol arthritis daily. I asked her about lidocaine patches.  They do not help. Steriod injections do not help. Cannot tolerate tramadol.  I adv I would forward to Dr. Angelena Form and pharmacy for any other recommendations.

## 2019-07-24 NOTE — Telephone Encounter (Signed)
-----   Message from Burnell Blanks, MD sent at 07/24/2019  7:12 AM EDT ----- LDL ok. LFTs ok. H/H stable. Her potassium is up a little. I would recommend that she stop her aldactone. cdm

## 2019-07-24 NOTE — Telephone Encounter (Signed)
OK to add an advil. At her age, her comfort is important and this should not cause any issues. chris

## 2019-07-27 NOTE — Telephone Encounter (Signed)
Patient's daughter informed.  She is grateful for assistance. Will add in one advil 200 mg daily to patient's medicines. Medication list updated.

## 2019-07-29 ENCOUNTER — Encounter: Payer: Self-pay | Admitting: Cardiovascular Disease

## 2019-07-29 ENCOUNTER — Other Ambulatory Visit: Payer: Self-pay

## 2019-07-29 ENCOUNTER — Ambulatory Visit: Payer: Medicare Other | Admitting: Cardiovascular Disease

## 2019-07-29 VITALS — BP 104/60 | HR 70 | Ht 64.5 in | Wt 161.0 lb

## 2019-07-29 DIAGNOSIS — I447 Left bundle-branch block, unspecified: Secondary | ICD-10-CM

## 2019-07-29 DIAGNOSIS — I2510 Atherosclerotic heart disease of native coronary artery without angina pectoris: Secondary | ICD-10-CM | POA: Diagnosis not present

## 2019-07-29 DIAGNOSIS — I5022 Chronic systolic (congestive) heart failure: Secondary | ICD-10-CM

## 2019-07-29 DIAGNOSIS — I350 Nonrheumatic aortic (valve) stenosis: Secondary | ICD-10-CM | POA: Diagnosis not present

## 2019-07-29 DIAGNOSIS — E7849 Other hyperlipidemia: Secondary | ICD-10-CM

## 2019-07-29 DIAGNOSIS — I255 Ischemic cardiomyopathy: Secondary | ICD-10-CM

## 2019-07-29 DIAGNOSIS — I340 Nonrheumatic mitral (valve) insufficiency: Secondary | ICD-10-CM

## 2019-07-29 NOTE — Patient Instructions (Addendum)
Medication Instructions:  No changes *If you need a refill on your cardiac medications before your next appointment, please call your pharmacy*   Lab Work: none If you have labs (blood work) drawn today and your tests are completely normal, you will receive your results only by: . MyChart Message (if you have MyChart) OR . A paper copy in the mail If you have any lab test that is abnormal or we need to change your treatment, we will call you to review the results.   Testing/Procedures: Your physician has requested that you have an echocardiogram. Echocardiography is a painless test that uses sound waves to create images of your heart. It provides your doctor with information about the size and shape of your heart and how well your heart's chambers and valves are working. This procedure takes approximately one hour. There are no restrictions for this procedure.   Follow-Up: At [facility], you and your health needs are our priority.  As part of our continuing mission to provide you with exceptional heart care, we have created designated Provider Care Teams.  These Care Teams include your primary Cardiologist (physician) and Advanced Practice Providers (APPs -  Physician Assistants and Nurse Practitioners) who all work together to provide you with the care you need, when you need it.   Your next appointment:   12 month(s)  The format for your next appointment:   In Person  Provider:   You may see Christopher McAlhany, MD or one of the following Advanced Practice Providers on your designated Care Team:    Dayna Dunn, PA-C  Michele Lenze, PA-C   Other Instructions   

## 2019-07-29 NOTE — Progress Notes (Signed)
Chief Complaint  Patient presents with  . Follow-up    CAD   History of Present Illness: 86 yo female with history of CAD, ischemic cardiomyopathy, LBBB, mitral regurgitation and aortic stenosis who is here today for cardiac follow up. She was admitted to [facility] in November of 2015 with a NSTEMI and was found to have severe calcific disease in the LAD by cath 12/28/13. LVEF was 10%. She refused CABG and underwent high risk PCI/rotablator atherectomy of the LAD with Impella LV support. 2 drug eluting stents were placed in the LAD. Echo May 2019 with LVEF=30-35%, mild to moderate AS.   She is here today for follow up. The patient denies any chest pain, dyspnea, palpitations, lower extremity edema, orthopnea, PND, dizziness, near syncope or syncope. She has shoulder pain.    Primary care provider:  She goes to urgent care if she needs a doctor  Past Medical History:  Diagnosis Date  . Arthritis    "mostly my hands" (12/25/2013)  . Asthma   . Ischemic cardiomyopathy    a. EF 15%  . PONV (postoperative nausea and vomiting)   . Upper airway cough syndrome    Post-nasal drip    Past Surgical History:  Procedure Laterality Date  . APPENDECTOMY  1960's  . CARDIAC CATHETERIZATION  12/28/2013   Procedure: RIGHT/LEFT HEART CATH AND CORONARY ANGIOGRAPHY;  Surgeon: Troy Sine, MD;  Location: [facility] CATH LAB;  Service: Cardiovascular;;  . CYSTOCELE REPAIR  12/18/2011   Procedure: ANTERIOR REPAIR (CYSTOCELE);  Surgeon: Gus Height, MD;  Location: [facility];  Service: Gynecology;  Laterality: N/A;  . JOINT REPLACEMENT    . JOINT REPLACEMENT Right 1980's   "ring finger"  . PERCUTANEOUS CORONARY STENT INTERVENTION (PCI-S) N/A 12/30/2013   Procedure: PERCUTANEOUS CORONARY STENT INTERVENTION (PCI-S);  Surgeon: Burnell Blanks, MD;  Location: [facility] CATH LAB;  Service: Cardiovascular;  Laterality: N/A;  . REPLACEMENT TOTAL KNEE BILATERAL Bilateral 2009  . TOTAL HIP ARTHROPLASTY Left 10/17/2018    Procedure: LEFT TOTAL HIP ARTHROPLASTY ANTERIOR APPROACH;  Surgeon: Mcarthur Rossetti, MD;  Location: [facility];  Service: Orthopedics;  Laterality: Left;    Current Outpatient Medications  Medication Sig Dispense Refill  . acetaminophen (TYLENOL) 650 MG CR tablet Take 1,300 mg by mouth 2 (two) times daily.     Marland Kitchen amoxicillin (AMOXIL) 500 MG capsule Take 2,000 mg by mouth See admin instructions. Take 2000 mg by mouth 1 hour prior to dental appointments  0  . atorvastatin (LIPITOR) 40 MG tablet TAKE 1/2 TABLET BY MOUTH  ONCE DAILY AT 6PM 45 tablet 3  . bisacodyl (DULCOLAX) 5 MG EC tablet Take 10 mg by mouth daily.     . carvedilol (COREG) 6.25 MG tablet TAKE 1 TABLET BY MOUTH  TWICE DAILY WITH A MEAL 180 tablet 3  . clopidogrel (PLAVIX) 75 MG tablet TAKE 1 TABLET BY MOUTH  DAILY WITH BREAKFAST 90 tablet 3  . furosemide (LASIX) 40 MG tablet TAKE 1 TABLET BY MOUTH  DAILY 90 tablet 3  . HYDROcodone-acetaminophen (NORCO/VICODIN) 5-325 MG tablet Take 0.5 tablets by mouth every 6 (six) hours as needed for moderate pain (pain score 4-6). 20 tablet 0  . ibuprofen (ADVIL) 200 MG tablet Take 200 mg by mouth daily as needed.    Marland Kitchen losartan (COZAAR) 25 MG tablet TAKE 1 TABLET BY MOUTH  DAILY (STOP TELMISARTAN) 90 tablet 3  . ondansetron (ZOFRAN ODT) 4 MG disintegrating tablet Take 1 tablet (4 mg total) by mouth  every 8 (eight) hours as needed for nausea or vomiting. 20 tablet 0   No current facility-administered medications for this visit.    Allergies  Allergen Reactions  . Codeine     Nauseated  . Lisinopril Cough  . Tramadol     Confusion    Social History   Socioeconomic History  . Marital status: Widowed    Spouse name: Not on file  . Number of children: Not on file  . Years of education: Not on file  . Highest education level: Not on file  Occupational History  . Not on file  Tobacco Use  . Smoking status: Never Smoker  . Smokeless tobacco: Never Used  Vaping Use  . Vaping Use:  Never used  Substance and Sexual Activity  . Alcohol use: No  . Drug use: No  . Sexual activity: Not on file  Other Topics Concern  . Not on file  Social History Narrative  . Not on file   Social Determinants of Health   Financial Resource Strain:   . Difficulty of Paying Living Expenses:   Food Insecurity:   . Worried About Charity fundraiser in the Last Year:   . Arboriculturist in the Last Year:   Transportation Needs:   . Film/video editor (Medical):   Marland Kitchen Lack of Transportation (Non-Medical):   Physical Activity:   . Days of Exercise per Week:   . Minutes of Exercise per Session:   Stress:   . Feeling of Stress :   Social Connections:   . Frequency of Communication with Friends and Family:   . Frequency of Social Gatherings with Friends and Family:   . Attends Religious Services:   . Active Member of Clubs or Organizations:   . Attends Archivist Meetings:   Marland Kitchen Marital Status:   Intimate Partner Violence:   . Fear of Current or Ex-Partner:   . Emotionally Abused:   Marland Kitchen Physically Abused:   . Sexually Abused:     Family History  Problem Relation Age of Onset  . Cerebral aneurysm Mother        died at age 65  . Healthy Father        died of old age    Review of Systems:  As stated in the HPI and otherwise negative.   BP 104/60   Pulse 70   Ht 5' 4.5" (1.638 m)   Wt 161 lb (73 kg)   SpO2 97%   BMI 27.21 kg/m   Physical Examination:  General: Well developed, well nourished, NAD  HEENT: OP clear, mucus membranes moist  SKIN: warm, dry. No rashes. Neuro: No focal deficits  Musculoskeletal: Muscle strength 5/5 all ext  Psychiatric: Mood and affect normal  Neck: No JVD, no carotid bruits, no thyromegaly, no lymphadenopathy.  Lungs:Clear bilaterally, no wheezes, rhonci, crackles Cardiovascular: Regular rate and rhythm. No murmurs, gallops or rubs. Abdomen:Soft. Bowel sounds present. Non-tender.  Extremities: No lower extremity edema. Pulses  are 2 + in the bilateral DP/PT.  Echo May 2019 - Left ventricle: The cavity size was normal. There was moderate   concentric hypertrophy. Systolic function was moderately to   severely reduced. The estimated ejection fraction was in the   range of 30% to 35%. There was dynamic obstruction, with a peak   velocity of 390 cm/sec and a peak gradient of 61 mm Hg. Akinesis   of the basal-midanteroseptal, inferior, and inferoseptal   myocardium. Left ventricular diastolic  function parameters were   normal. Doppler parameters are consistent with indeterminate   ventricular filling pressure. - Aortic valve: There was mild stenosis. Peak velocity (S): 238   cm/s. Mean gradient (S): 12 mm Hg. - Mitral valve: Transvalvular velocity was within the normal range.   There was no evidence for stenosis. There was mild regurgitation. - Left atrium: The atrium was moderately dilated. - Right ventricle: The cavity size was normal. Wall thickness was   normal. Systolic function was normal. - Tricuspid valve: There was trivial regurgitation.  EKG:  EKG is ordered today. The ekg ordered today demonstrates   Recent Labs: 07/23/2019: ALT 11; BUN 39; Creatinine, Ser 1.25; Hemoglobin 10.4; Platelets 217; Potassium 5.3; Sodium 143   Lipid Panel    Component Value Date/Time   CHOL 131 07/23/2019 0839   TRIG 110 07/23/2019 0839   HDL 46 07/23/2019 0839   CHOLHDL 2.8 07/23/2019 0839   CHOLHDL 2.2 01/04/2016 1058   VLDL 24 01/04/2016 1058   LDLCALC 65 07/23/2019 0839     Wt Readings from Last 3 Encounters:  07/29/19 161 lb (73 kg)  01/02/19 150 lb (68 kg)  10/17/18 153 lb 3.2 oz (69.5 kg)     Other studies Reviewed: Additional studies/ records that were reviewed today include: . Review of the above records demonstrates:    Assessment and Plan:   1. CAD without angina: No chest pain. Continue Plavix, statin and beta blocker.       2. Ischemic cardiomyopathy: LVEF 30-35% by echo May 2019. Continue  beta blocker and ARB. Aldactone stopped due to hyperkalemia.  3. Chronic systolic CHF: Weight is stable. Volume status is ok. Will continue Lasix  4. LBBB: Chronic  5. Mitral regurgitation: Mild by echo in May 2019.   6. Aortic stenosis: She had moderate AS by echo in May 2019 but likely low gradient/low output severe AS due to LV systolic dysfunction. She remains asymptomatic. Will repeat an echo now.   7. Hyperlipidemia: LDL 65. Continue statin.   8. LVH: Moderate LVH on echo May 2019.    Current medicines are reviewed at length with the patient today.  The patient does not have concerns regarding medicines.  The following changes have been made:  no change  Labs/ tests ordered today include:   Orders Placed This Encounter  Procedures  . EKG 12-Lead  . ECHOCARDIOGRAM COMPLETE    Disposition:   FU with me in 6 months  Signed, Lauree Chandler, MD 07/29/2019 4:52 PM    [facility] [place], [place], [place]  [address] Phone: [phone]; Fax: [phone]

## 2019-08-04 ENCOUNTER — Telehealth: Payer: Self-pay | Admitting: Cardiovascular Disease

## 2019-08-04 DIAGNOSIS — R7989 Other specified abnormal findings of blood chemistry: Secondary | ICD-10-CM

## 2019-08-04 MED ORDER — SPIRONOLACTONE 25 MG PO TABS: 12.5000 mg | ORAL_TABLET | Freq: Every day | ORAL | 3 refills | Status: DC

## 2019-08-04 NOTE — Addendum Note (Signed)
Addended by: Antonieta Iba on: 08/04/2019 04:45 PM   Modules accepted: Orders

## 2019-08-04 NOTE — Telephone Encounter (Signed)
Spoke with the patient's daughter. She will restart spironolactone at 12.5 mg daily. She has been scheduled for repeat BMET next Wednesday.

## 2019-08-04 NOTE — Telephone Encounter (Signed)
She can restart the spironolactone at 12.5 mg daily. She will need a repeat BMET in one week. Thanks, chris

## 2019-08-04 NOTE — Telephone Encounter (Signed)
Spoke with the patient's daughter who reports that she noticed over the weekend that the patient had some swelling around her abdomen. Earlier today she also noticed some slight swelling in her feet. Patient was taking spironolactone 12.5mg  BID, which was stopped 06/25 due to elevated K. She denies any SOB. Her weight is up to 158 and she usually fluctuates between 150-154lbs. Advised the daughter to have the patient elevate her legs and avoid salt. Daughter would like to know if it would be okay for the patient to restart spironolactone. Will route to Dr. Angelena Form for advisement.

## 2019-08-04 NOTE — Telephone Encounter (Signed)
Patient's daughter states she is returning a call in regards to [place] message she sent. Please advise.

## 2019-08-05 ENCOUNTER — Other Ambulatory Visit: Payer: Medicare Other

## 2019-08-10 NOTE — Telephone Encounter (Signed)
I would have her increase her Lasix to 40 mg po BID for one week. We can delay the repeat BMET until next Monday. Thanks, [name]

## 2019-08-12 ENCOUNTER — Other Ambulatory Visit: Payer: Medicare Other

## 2019-08-17 ENCOUNTER — Other Ambulatory Visit: Payer: Medicare Other | Admitting: *Deleted

## 2019-08-17 ENCOUNTER — Other Ambulatory Visit: Payer: Self-pay

## 2019-08-17 DIAGNOSIS — R7989 Other specified abnormal findings of blood chemistry: Secondary | ICD-10-CM | POA: Diagnosis not present

## 2019-08-18 LAB — BASIC METABOLIC PANEL
BUN/Creatinine Ratio: 42 — ABNORMAL HIGH (ref 12–28)
BUN: 63 mg/dL — ABNORMAL HIGH (ref 10–36)
CO2: 22 mmol/L (ref 20–29)
Calcium: 9.4 mg/dL (ref 8.7–10.3)
Chloride: 105 mmol/L (ref 96–106)
Creatinine, Ser: 1.49 mg/dL — ABNORMAL HIGH (ref 0.57–1.00)
GFR calc Af Amer: 35 mL/min/{1.73_m2} — ABNORMAL LOW (ref >59)
GFR calc non Af Amer: 30 mL/min/{1.73_m2} — ABNORMAL LOW (ref >59)
Glucose: 98 mg/dL (ref 65–99)
Potassium: 4.6 mmol/L (ref 3.5–5.2)
Sodium: 140 mmol/L (ref 134–144)

## 2019-08-19 ENCOUNTER — Telehealth: Payer: Self-pay | Admitting: Cardiovascular Disease

## 2019-08-19 DIAGNOSIS — I5022 Chronic systolic (congestive) heart failure: Secondary | ICD-10-CM

## 2019-08-19 NOTE — Telephone Encounter (Signed)
Spoke w Ms. Toome's daughter. The patient's weight is 156 (normal 150-154). Swelling in ankles is about the same on the lasix 40 BID.   Her daughter asks if she could go back on spironolactone 12.5 mg twice a day while reducing lasix to just one pill again like they had been doing and see if swelling improves.  This worked best for swelling.   I told her because K+ was elevated in past we would check with Dr. Angelena Form and let her know his recommendations.  They will do what he recommends.

## 2019-08-19 NOTE — Telephone Encounter (Signed)
Patient's daughter is returning call to discuss results from lab work completed on 08/17/19. Please call.

## 2019-08-21 MED ORDER — SPIRONOLACTONE 25 MG PO TABS: 12.5000 mg | ORAL_TABLET | Freq: Two times a day (BID) | ORAL | 3 refills | Status: DC

## 2019-08-21 NOTE — Telephone Encounter (Signed)
I am ok with that plan but will need repeat BMET in one week. Thanks, chris

## 2019-08-21 NOTE — Telephone Encounter (Signed)
Called and spoke w patient's daughter.  Medication list updated. Pt will take Lasix 40 mg daily and spironolactone 12.5 mg twice day. Will come in on 08/28/19 for BMET.

## 2019-08-24 ENCOUNTER — Other Ambulatory Visit: Payer: Self-pay

## 2019-08-24 ENCOUNTER — Ambulatory Visit (HOSPITAL_COMMUNITY): Payer: Medicare Other | Attending: Internal Medicine

## 2019-08-24 DIAGNOSIS — I350 Nonrheumatic aortic (valve) stenosis: Secondary | ICD-10-CM

## 2019-08-24 DIAGNOSIS — I447 Left bundle-branch block, unspecified: Secondary | ICD-10-CM

## 2019-08-24 DIAGNOSIS — I255 Ischemic cardiomyopathy: Secondary | ICD-10-CM

## 2019-08-24 DIAGNOSIS — I340 Nonrheumatic mitral (valve) insufficiency: Secondary | ICD-10-CM

## 2019-08-24 DIAGNOSIS — I2510 Atherosclerotic heart disease of native coronary artery without angina pectoris: Secondary | ICD-10-CM

## 2019-08-24 DIAGNOSIS — E7849 Other hyperlipidemia: Secondary | ICD-10-CM

## 2019-08-24 DIAGNOSIS — I5022 Chronic systolic (congestive) heart failure: Secondary | ICD-10-CM

## 2019-08-24 LAB — ECHOCARDIOGRAM COMPLETE
AR max vel: 1.3 cm2
AV Area VTI: 1.15 cm2
AV Area mean vel: 1.07 cm2
AV Mean grad: 9.3 mmHg
AV Peak grad: 15.1 mmHg
Ao pk vel: 1.94 m/s
Area-P 1/2: 2.5 cm2
S' Lateral: 2.9 cm

## 2019-08-28 ENCOUNTER — Other Ambulatory Visit: Payer: Medicare Other

## 2019-08-31 ENCOUNTER — Other Ambulatory Visit: Payer: Self-pay

## 2019-08-31 ENCOUNTER — Other Ambulatory Visit: Payer: Medicare Other

## 2019-08-31 DIAGNOSIS — I5022 Chronic systolic (congestive) heart failure: Secondary | ICD-10-CM | POA: Diagnosis not present

## 2019-09-01 LAB — BASIC METABOLIC PANEL
BUN/Creatinine Ratio: 28 (ref 12–28)
BUN: 38 mg/dL — ABNORMAL HIGH (ref 10–36)
CO2: 19 mmol/L — ABNORMAL LOW (ref 20–29)
Calcium: 9.1 mg/dL (ref 8.7–10.3)
Chloride: 107 mmol/L — ABNORMAL HIGH (ref 96–106)
Creatinine, Ser: 1.34 mg/dL — ABNORMAL HIGH (ref 0.57–1.00)
GFR calc Af Amer: 40 mL/min/{1.73_m2} — ABNORMAL LOW (ref >59)
GFR calc non Af Amer: 35 mL/min/{1.73_m2} — ABNORMAL LOW (ref >59)
Glucose: 225 mg/dL — ABNORMAL HIGH (ref 65–99)
Potassium: 4.7 mmol/L (ref 3.5–5.2)
Sodium: 140 mmol/L (ref 134–144)

## 2019-09-23 ENCOUNTER — Ambulatory Visit: Payer: Medicare Other | Admitting: Orthopaedic Surgery

## 2019-09-28 ENCOUNTER — Encounter: Payer: Self-pay | Admitting: Orthopaedic Surgery

## 2019-09-28 ENCOUNTER — Ambulatory Visit: Payer: Medicare Other | Admitting: Orthopaedic Surgery

## 2019-09-28 DIAGNOSIS — M25512 Pain in left shoulder: Secondary | ICD-10-CM

## 2019-09-28 DIAGNOSIS — G8929 Other chronic pain: Secondary | ICD-10-CM

## 2019-09-28 DIAGNOSIS — M25511 Pain in right shoulder: Secondary | ICD-10-CM

## 2019-09-28 MED ORDER — LIDOCAINE HCL 1 % IJ SOLN
3.0000 mL | INTRAMUSCULAR | Status: AC | PRN
  Administered 2019-09-28: 3 mL

## 2019-09-28 MED ORDER — METHYLPREDNISOLONE ACETATE 40 MG/ML IJ SUSP
40.0000 mg | INTRAMUSCULAR | Status: AC | PRN
  Administered 2019-09-28: 40 mg via INTRA_ARTICULAR

## 2019-09-28 NOTE — Progress Notes (Signed)
Office Visit Note   Patient: Beth Johnston           Date of Birth: 06-Mar-1925           MRN: [id number] Visit Date: 09/28/2019              Requested by: No referring provider defined for this encounter. PCP: Patient, No Pcp Per   Assessment & Plan: Visit Diagnoses:  1. Chronic pain of both shoulders     Plan: Per the patient's wishes I did provide a steroid injection in both shoulders today which she tolerated well.  She knows to always wait between 3 and 4 months between injections.  All questions and concerns were answered and addressed.  Follow-up is as needed.  Follow-Up Instructions: Return if symptoms worsen or fail to improve.   Orders:  Orders Placed This Encounter  Procedures  . Large Joint Inj  . Large Joint Inj   No orders of the defined types were placed in this encounter.     Procedures: Large Joint Inj on 09/28/2019 1:20 PM Indications: pain and diagnostic evaluation Details: 22 G 1.5 in needle  Arthrogram: No  Medications: 3 mL lidocaine 1 %; 40 mg methylPREDNISolone acetate 40 MG/ML Outcome: tolerated well, no immediate complications Procedure, treatment alternatives, risks and benefits explained, specific risks discussed. Consent was given by the patient. Immediately prior to procedure a time out was called to verify the correct patient, procedure, equipment, support staff and site/side marked as required. Patient was prepped and draped in the usual sterile fashion.   Large Joint Inj: L subacromial bursa on 09/28/2019 1:20 PM Indications: pain and diagnostic evaluation Details: 22 G 1.5 in needle  Arthrogram: No  Medications: 3 mL lidocaine 1 %; 40 mg methylPREDNISolone acetate 40 MG/ML Outcome: tolerated well, no immediate complications Procedure, treatment alternatives, risks and benefits explained, specific risks discussed. Consent was given by the patient. Immediately prior to procedure a time out was called to verify the correct patient,  procedure, equipment, support staff and site/side marked as required. Patient was prepped and draped in the usual sterile fashion.       Clinical Data: No additional findings.   Subjective: Chief Complaint  Patient presents with  . Left Shoulder - Follow-up  . Right Shoulder - Follow-up  The patient is well-known to me.  She is 86 years old and very active.  She has significant arthropathy of both shoulders.  She comes in from time to time for steroid injections in her shoulders.  It has been 4 months since her last steroid injections.  She would like to have these in both shoulders today.  She denies any acute change in medical status otherwise.  The shoulders hurt with activities of daily living and do hurt on a daily basis in terms of pain.  HPI  Review of Systems She currently denies any headache, chest pain, shortness of breath, fever, chills, nausea, vomiting  Objective: Vital Signs: There were no vitals taken for this visit.  Physical Exam She is alert and orient x3 and in no acute distress Ortho Exam Examination of both shoulder shows significant pain with any attempts of motion of the shoulders.  They are both well located and you can move her shoulders but it is painful to do so.  There is evidence of rotator cuff arthropathy of both shoulders. Specialty Comments:  No specialty comments available.  Imaging: No results found.   PMFS History: Patient Active Problem List  Diagnosis Date Noted  . Status post total replacement of left hip 10/17/2018  . Unilateral primary osteoarthritis, left hip 03/24/2018  . Chronic pain of both shoulders 02/27/2016  . Acute heart failure ([place])   . Elevated troponin   . Stented coronary artery   . Acute respiratory failure ([place]) 01/02/2014  . CAD S/P high risk LAD PCI/DES 12/30/13 01/02/2014  . Pseudoaneurysm following PCI 01/02/2014  . Blood loss anemia 01/02/2014  . Acute systolic CHF (congestive heart failure) ([place])   .  NSTEMI (non-ST elevated myocardial infarction) ([place]) 12/26/2013  . Mitral regurgitation 12/26/2013  . Cardiomyopathy, ischemic-EF 15%-pre PCI 12/26/2013  . LBBB (left bundle branch block) 12/25/2013  . Asthma, persistent controlled 05/28/2013  . Upper airway cough syndrome 05/28/2013  . Cough 05/11/2013   Past Medical History:  Diagnosis Date  . Arthritis    "mostly my hands" (12/25/2013)  . Asthma   . Ischemic cardiomyopathy    a. EF 15%  . PONV (postoperative nausea and vomiting)   . Upper airway cough syndrome    Post-nasal drip    Family History  Problem Relation Age of Onset  . Cerebral aneurysm Mother        died at age 28  . Healthy Father        died of old age    Past Surgical History:  Procedure Laterality Date  . APPENDECTOMY  1960's  . CARDIAC CATHETERIZATION  12/28/2013   Procedure: RIGHT/LEFT HEART CATH AND CORONARY ANGIOGRAPHY;  Surgeon: Troy Sine, MD;  Location: [facility] CATH LAB;  Service: Cardiovascular;;  . CYSTOCELE REPAIR  12/18/2011   Procedure: ANTERIOR REPAIR (CYSTOCELE);  Surgeon: Gus Height, MD;  Location: [facility];  Service: Gynecology;  Laterality: N/A;  . JOINT REPLACEMENT    . JOINT REPLACEMENT Right 1980's   "ring finger"  . PERCUTANEOUS CORONARY STENT INTERVENTION (PCI-S) N/A 12/30/2013   Procedure: PERCUTANEOUS CORONARY STENT INTERVENTION (PCI-S);  Surgeon: Burnell Blanks, MD;  Location: [facility] CATH LAB;  Service: Cardiovascular;  Laterality: N/A;  . REPLACEMENT TOTAL KNEE BILATERAL Bilateral 2009  . TOTAL HIP ARTHROPLASTY Left 10/17/2018   Procedure: LEFT TOTAL HIP ARTHROPLASTY ANTERIOR APPROACH;  Surgeon: Mcarthur Rossetti, MD;  Location: [facility];  Service: Orthopedics;  Laterality: Left;   Social History   Occupational History  . Not on file  Tobacco Use  . Smoking status: Never Smoker  . Smokeless tobacco: Never Used  Vaping Use  . Vaping Use: Never used  Substance and Sexual Activity  . Alcohol use: No  . Drug use: No  .  Sexual activity: Not on file

## 2019-10-01 ENCOUNTER — Encounter: Payer: Self-pay | Admitting: Orthopaedic Surgery

## 2019-10-29 ENCOUNTER — Telehealth: Payer: Self-pay | Admitting: *Deleted

## 2019-10-29 NOTE — Telephone Encounter (Signed)
Ortho bundle 1 year call completed. ?

## 2019-10-30 DIAGNOSIS — L814 Other melanin hyperpigmentation: Secondary | ICD-10-CM | POA: Diagnosis not present

## 2019-10-30 DIAGNOSIS — D485 Neoplasm of uncertain behavior of skin: Secondary | ICD-10-CM | POA: Diagnosis not present

## 2019-10-30 DIAGNOSIS — L570 Actinic keratosis: Secondary | ICD-10-CM | POA: Diagnosis not present

## 2019-10-30 DIAGNOSIS — C4441 Basal cell carcinoma of skin of scalp and neck: Secondary | ICD-10-CM | POA: Diagnosis not present

## 2019-10-30 DIAGNOSIS — L821 Other seborrheic keratosis: Secondary | ICD-10-CM | POA: Diagnosis not present

## 2019-10-30 DIAGNOSIS — Z85828 Personal history of other malignant neoplasm of skin: Secondary | ICD-10-CM | POA: Diagnosis not present

## 2019-11-11 ENCOUNTER — Encounter: Payer: Self-pay | Admitting: Physician Assistant

## 2019-11-11 ENCOUNTER — Telehealth: Payer: Self-pay | Admitting: Cardiovascular Disease

## 2019-11-11 ENCOUNTER — Other Ambulatory Visit: Payer: Self-pay

## 2019-11-11 ENCOUNTER — Ambulatory Visit: Payer: Medicare Other | Admitting: Physician Assistant

## 2019-11-11 VITALS — BP 110/52 | HR 84 | Ht 64.5 in | Wt 159.8 lb

## 2019-11-11 DIAGNOSIS — I350 Nonrheumatic aortic (valve) stenosis: Secondary | ICD-10-CM

## 2019-11-11 DIAGNOSIS — I2510 Atherosclerotic heart disease of native coronary artery without angina pectoris: Secondary | ICD-10-CM

## 2019-11-11 DIAGNOSIS — I5022 Chronic systolic (congestive) heart failure: Secondary | ICD-10-CM | POA: Diagnosis not present

## 2019-11-11 DIAGNOSIS — I340 Nonrheumatic mitral (valve) insufficiency: Secondary | ICD-10-CM

## 2019-11-11 DIAGNOSIS — R0902 Hypoxemia: Secondary | ICD-10-CM

## 2019-11-11 NOTE — Telephone Encounter (Signed)
Per daughter - patient has been doing great, yesterday took ant spray out to end of driveway.  Usually takes a gator instead of walking, carried the heavy jug. Bent over to spray them.  Got ant spray on hands and called daughter for  help washing her hands they were "itching".     Daughter came from next door and noticed her lips were turning a little blue.  Called 911. O2 sat was 70s.  Placed on O2, sats back to 98.   EKG-HR 71, SR, left BBB, (not new).  Hands stopped itching when O2 came up.  Did not want to go to the hospital so they told her to call our office today for appointment.  She has been scheduled for tomorrow.  Felt a lot better last night.  Slept fine. 90-95% all evening.   Currently 96 %, HR 86.   There was no chest pain or tightness. Feels the event was too much for her to do and overdid it but requesting patient be seen today if possible.  I have moved her to today.  Grateful for assistance.

## 2019-11-11 NOTE — Patient Instructions (Signed)
Medication Instructions:  Your physician recommends that you continue on your current medications as directed. Please refer to the Current Medication list given to you today.  *If you need a refill on your cardiac medications before your next appointment, please call your pharmacy*  Lab Work: None ordered today   If you have labs (blood work) drawn today and your tests are completely normal, you will receive your results only by: Marland Kitchen MyChart Message (if you have MyChart) OR . A paper copy in the mail If you have any lab test that is abnormal or we need to change your treatment, we will call you to review the results.  Testing/Procedures: None ordered today  Follow-Up: At [facility], you and your health needs are our priority.  As part of our continuing mission to provide you with exceptional heart care, we have created designated Provider Care Teams.  These Care Teams include your primary Cardiologist (physician) and Advanced Practice Providers (APPs -  Physician Assistants and Nurse Practitioners) who all work together to provide you with the care you need, when you need it.  Your next appointment:   8 month(s)  The format for your next appointment:   In Person  Provider:   Lauree Chandler, MD

## 2019-11-11 NOTE — Progress Notes (Signed)
Cardiology Office Note:    Date:  11/11/2019   ID:  Beth Johnston, DOB 10-17-1925, MRN [id number]  PCP:  Patient, No Pcp Per  [place] Cardiologist:  Lauree Chandler, MD   [place] Electrophysiologist:  None   Referring MD: No ref. provider found   Chief Complaint:  Hypoxemia    Patient Profile:    Beth Johnston is a 86 y.o. female with:   Coronary artery disease   S/p NSTEMI in 11/2013  Pt declined CABG  PCI:  Rotablator atherectomy and DES x 2 to LAD w Impella support   Systolic CHF  Intol of Spironolactone due to HyperK+  Ischemic CM  Echocardiogram 11/15: EF 10  Echocardiogram 5/19: EF 30-35  Echocardiogram 7/21: EF 30-35  LBBB  Mitral regurgitation   Mild by echocardiogram in 5/19  Mod by echocardiogram in 7/21   Aortic stenosis  Mod by echocardiogram in 5/19; likely low gradient low output severe AS  Mod by echocardiogram in 7/21   Prior CV studies: Echocardiogram 08/24/2019 EF 30-35, mild LVH, GR 1 DD, normal RVSF, mild LAE, moderate MR, mean AV gradient 10 mmHg, DI 0.37, AV V-max 194 cm/s  Echocardiogram 06/04/2017 Moderate LVH, EF 30-35, dynamic obstruction with peak gradient 61 mmHg, anteroseptal/inferior/inferoseptal AK, AV mean gradient 12 mmHg, DI 0.33, AV peak velocity 238 cm/s, mild MR  Cardiac catheterization 12/28/2013 LM normal LAD proximal 80 diffuse; D1 80, D2 70-80 LCx patent RCA 100 EF 10-15 PCI: Rotablator atherectomy and DES x2 to the LAD  History of Present Illness:    Beth Johnston was last seen by Dr. Angelena Form in 06/2019.  Follow-up echocardiogram in July 2021 demonstrated stable EF and moderate aortic stenosis and moderate mitral regurgitation.  She called in today with an episode of hypoxemia.  She had gone out side to spray ant killer and got some on her hands.  She appeared cyanotic to her daughter and she was complaining of her hands itching.  EMS came out and her sats were in the 70s.  Her sats  quickly recovered with O2.  She is seen for further evaluation.  She notes that she walked out to the end of her driveway to spray fire ants.  They emerged from the mound and stung her around her ankles on both legs.   She walked back to the house and she was short of breath and appeared cyanotic.  She had no chest pain and did not have syncope.  Her O2 sats were in the 70s.  She did not take Benadryl and did not go to the hospital. She felt normal after 30-60 minutes and feels at there baseline today.  She has not had recent exertional chest pain, dyspnea on exertion or syncope.        Past Medical History:  Diagnosis Date  . Arthritis    "mostly my hands" (12/25/2013)  . Asthma   . Ischemic cardiomyopathy    a. EF 15%  . PONV (postoperative nausea and vomiting)   . Upper airway cough syndrome    Post-nasal drip    Current Medications: Current Meds  Medication Sig  . acetaminophen (TYLENOL) 650 MG CR tablet Take 1,300 mg by mouth 2 (two) times daily.   Marland Kitchen amoxicillin (AMOXIL) 500 MG capsule Take 2,000 mg by mouth See admin instructions. Take 2000 mg by mouth 1 hour prior to dental appointments  . atorvastatin (LIPITOR) 40 MG tablet TAKE 1/2 TABLET BY MOUTH  ONCE DAILY AT 6PM  .  bisacodyl (DULCOLAX) 5 MG EC tablet Take 10 mg by mouth daily.   . carvedilol (COREG) 6.25 MG tablet TAKE 1 TABLET BY MOUTH  TWICE DAILY WITH A MEAL  . clopidogrel (PLAVIX) 75 MG tablet TAKE 1 TABLET BY MOUTH  DAILY WITH BREAKFAST  . furosemide (LASIX) 40 MG tablet TAKE 1 TABLET BY MOUTH  DAILY  . losartan (COZAAR) 25 MG tablet TAKE 1 TABLET BY MOUTH  DAILY (STOP TELMISARTAN)  . spironolactone (ALDACTONE) 25 MG tablet Take 0.5 tablets (12.5 mg total) by mouth 2 (two) times daily.  . [DISCONTINUED] HYDROcodone-acetaminophen (NORCO/VICODIN) 5-325 MG tablet Take 0.5 tablets by mouth every 6 (six) hours as needed for moderate pain (pain score 4-6).  . [DISCONTINUED] ibuprofen (ADVIL) 200 MG tablet Take 200 mg by mouth  daily as needed.  . [DISCONTINUED] ondansetron (ZOFRAN ODT) 4 MG disintegrating tablet Take 1 tablet (4 mg total) by mouth every 8 (eight) hours as needed for nausea or vomiting.     Allergies:   Codeine, Lisinopril, and Tramadol   Social History   Tobacco Use  . Smoking status: Never Smoker  . Smokeless tobacco: Never Used  Vaping Use  . Vaping Use: Never used  Substance Use Topics  . Alcohol use: No  . Drug use: No     Family Hx: The patient's family history includes Cerebral aneurysm in her mother; Healthy in her father.  Review of Systems  Constitutional: Negative for fever.  Respiratory: Negative for cough.   Gastrointestinal: Negative for hematochezia.  Genitourinary: Negative for hematuria.     EKGs/Labs/Other Test Reviewed:    EKG:  EKG is   ordered today.  The ekg ordered today demonstrates normal sinus rhythm, HR 80, 1st degree AVB, PR 212 ms, LBBB, no changes   Recent Labs: 07/23/2019: ALT 11; Hemoglobin 10.4; Platelets 217 08/31/2019: BUN 38; Creatinine, Ser 1.34; Potassium 4.7; Sodium 140   Recent Lipid Panel Lab Results  Component Value Date/Time   CHOL 131 07/23/2019 08:39 AM   TRIG 110 07/23/2019 08:39 AM   HDL 46 07/23/2019 08:39 AM   CHOLHDL 2.8 07/23/2019 08:39 AM   CHOLHDL 2.2 01/04/2016 10:58 AM   LDLCALC 65 07/23/2019 08:39 AM    Physical Exam:    VS:  BP (!) 110/52   Pulse 84   Ht 5' 4.5" (1.638 m)   Wt 159 lb 12.8 oz (72.5 kg)   SpO2 97%   BMI 27.01 kg/m     Wt Readings from Last 3 Encounters:  11/11/19 159 lb 12.8 oz (72.5 kg)  07/29/19 161 lb (73 kg)  01/02/19 150 lb (68 kg)     Constitutional:      Appearance: Healthy appearance. Not in distress.  Neck:     Thyroid: No thyromegaly.     Vascular: JVD normal.  Pulmonary:     Effort: Pulmonary effort is normal.     Breath sounds: No wheezing. No rales.  Cardiovascular:     Normal rate. Regular rhythm. Normal S1. Normal S2.     Murmurs: There is a grade 2/6  crescendo-decrescendo systolic murmur at the URSB.  Edema:    Ankle: bilateral trace edema of the ankle. Abdominal:     Palpations: Abdomen is soft.  Skin:    General: Skin is warm and dry.     Comments: Several scattered pustular lesions about her ankles bilat (L>R)  Neurological:     Mental Status: Alert and oriented to person, place and time.     Cranial  Nerves: Cranial nerves are intact.      ASSESSMENT & PLAN:    1. Hypoxemia I think she had an allergic reaction to the fire ant stings yesterday.  This likely caused some demand ischemia.  Since her O2 sats were low, she may have had some bronchospasm or bronchial edema.  Luckily, her symptoms resolved without any intervention.  She is completely back to normal today.  We ambulated her in the hallways and her O2 sats remained > 95% on RA.  I have reassured her today.  I do not think we need to pursue any testing.  I reviewed this with Dr. Burt Knack (attending MD) who agreed. FU with Dr. Angelena Form as planned.   2. Chronic systolic CHF (congestive heart failure) (HCC) EF 30-35.  Ischemic CM. NYHA 2-2b.  Volume status stable.  She will continue current doses of Carvedilol, Losartan and Spironolactone.  Her ankles seem to be swollen from the ant stings.  If this increases, she can take an extra dose of Furosemide.    3. Coronary artery disease involving native coronary artery of native heart without angina pectoris Hx of NSTEMI in 2015 and complex PCI with DES x 2 to LAD.  Her RCA is known to be chronically occluded.  She is not having anginal symptoms.  Continue Clopidogrel, Atorvastatin, Carvedilol.    4. Nonrheumatic aortic valve stenosis 5. Non-rheumatic mitral regurgitation Mod AS and mod MR by most recent echocardiogram.  Continue current management.      Dispo:  Return for Scheduled Follow Up w/ Dr. Angelena Form.   Medication Adjustments/Labs and Tests Ordered: Current medicines are reviewed at length with the patient today.  Concerns  regarding medicines are outlined above.  Tests Ordered: No orders of the defined types were placed in this encounter.  Medication Changes: No orders of the defined types were placed in this encounter.   Signed, Richardson Dopp, PA-C  11/11/2019 5:13 PM    [facility] [address], [place], [place]  [address] Phone: [phone]; Fax: [phone]

## 2019-11-11 NOTE — Telephone Encounter (Signed)
Patient's daughter states the patient's oxygen level has been low. She is not having any symptoms. She is scheduled for tomorrow at 3:15 pm with Cecilie Kicks, but would like to speak with a nurse to know if she can come in sooner today.

## 2019-11-12 ENCOUNTER — Ambulatory Visit: Payer: Medicare Other | Admitting: Cardiology

## 2019-11-12 NOTE — Addendum Note (Signed)
Addended by: Gaetano Net on: 11/12/2019 02:26 PM   Modules accepted: Orders

## 2019-11-12 NOTE — Telephone Encounter (Signed)
See OV note from yesterday. Richardson Dopp, PA-C    11/12/2019 11:34 AM

## 2019-12-04 DIAGNOSIS — C4441 Basal cell carcinoma of skin of scalp and neck: Secondary | ICD-10-CM | POA: Diagnosis not present

## 2019-12-04 DIAGNOSIS — L570 Actinic keratosis: Secondary | ICD-10-CM | POA: Diagnosis not present

## 2019-12-10 ENCOUNTER — Other Ambulatory Visit: Payer: Self-pay | Admitting: Cardiovascular Disease

## 2019-12-28 ENCOUNTER — Ambulatory Visit (INDEPENDENT_AMBULATORY_CARE_PROVIDER_SITE_OTHER): Payer: Medicare Other | Admitting: Orthopaedic Surgery

## 2019-12-28 ENCOUNTER — Encounter: Payer: Self-pay | Admitting: Orthopaedic Surgery

## 2019-12-28 DIAGNOSIS — M25511 Pain in right shoulder: Secondary | ICD-10-CM | POA: Diagnosis not present

## 2019-12-28 DIAGNOSIS — G8929 Other chronic pain: Secondary | ICD-10-CM | POA: Diagnosis not present

## 2019-12-28 DIAGNOSIS — M25512 Pain in left shoulder: Secondary | ICD-10-CM | POA: Diagnosis not present

## 2019-12-28 MED ORDER — LIDOCAINE HCL 1 % IJ SOLN
3.0000 mL | INTRAMUSCULAR | Status: AC | PRN
  Administered 2019-12-28: 3 mL

## 2019-12-28 MED ORDER — METHYLPREDNISOLONE ACETATE 40 MG/ML IJ SUSP
40.0000 mg | INTRAMUSCULAR | Status: AC | PRN
  Administered 2019-12-28: 40 mg via INTRA_ARTICULAR

## 2019-12-28 NOTE — Progress Notes (Signed)
Office Visit Note   Patient: Beth Johnston           Date of Birth: 07-29-1925           MRN: [id number] Visit Date: 12/28/2019              Requested by: No referring provider defined for this encounter. PCP: Patient, No Pcp Per   Assessment & Plan: Visit Diagnoses:  1. Chronic pain of both shoulders     Plan: I did place a steroid injection in both shoulders per the patient's request.  This was in the subacromial space and they went in easily.  All questions and concerns were answered and addressed.  We can repeat these again in 3 months at her request.  Follow-Up Instructions: Return in about 3 months (around 03/28/2020).   Orders:  Orders Placed This Encounter  Procedures  . Large Joint Inj  . Large Joint Inj   No orders of the defined types were placed in this encounter.     Procedures: Large Joint Inj: R subacromial bursa on 12/28/2019 11:02 AM Indications: pain and diagnostic evaluation Details: 22 G 1.5 in needle  Arthrogram: No  Medications: 3 mL lidocaine 1 %; 40 mg methylPREDNISolone acetate 40 MG/ML Outcome: tolerated well, no immediate complications Procedure, treatment alternatives, risks and benefits explained, specific risks discussed. Consent was given by the patient. Immediately prior to procedure a time out was called to verify the correct patient, procedure, equipment, support staff and site/side marked as required. Patient was prepped and draped in the usual sterile fashion.   Large Joint Inj: L subacromial bursa on 12/28/2019 11:02 AM Indications: pain and diagnostic evaluation Details: 22 G 1.5 in needle  Arthrogram: No  Medications: 3 mL lidocaine 1 %; 40 mg methylPREDNISolone acetate 40 MG/ML Outcome: tolerated well, no immediate complications Procedure, treatment alternatives, risks and benefits explained, specific risks discussed. Consent was given by the patient. Immediately prior to procedure a time out was called to verify the correct  patient, procedure, equipment, support staff and site/side marked as required. Patient was prepped and draped in the usual sterile fashion.       Clinical Data: No additional findings.   Subjective: Chief Complaint  Patient presents with  . Left Shoulder - Follow-up  . Right Shoulder - Follow-up  The patient comes in today requesting steroid injections in both her shoulders.  She is now 86 years old.  She had a recent birthday.  She is a diabetic but reports that the steroid injections never affect her blood glucose detrimentally.  She has known severe arthritis in both shoulders.  She is not interested in any type of surgical intervention.  We have been injecting her every 3 months for some time now.  She says the injections last home a short amount of time but she says it is worth it for her to keep trying this.  She has had no other acute change in her medical status.  HPI  Review of Systems She currently denies any headache, chest pain, shortness of breath, fever, chills, nausea, vomiting  Objective: Vital Signs: There were no vitals taken for this visit.  Physical Exam She is alert and orient x3 and in no acute distress Ortho Exam Examination of both shoulder show they are well located.  Both shoulders have rotator cuff deficiency. Specialty Comments:  No specialty comments available.  Imaging: No results found.   PMFS History: Patient Active Problem List   Diagnosis Date Noted  .  Status post total replacement of left hip 10/17/2018  . Unilateral primary osteoarthritis, left hip 03/24/2018  . Chronic pain of both shoulders 02/27/2016  . Acute heart failure ([place])   . Elevated troponin   . Stented coronary artery   . Acute respiratory failure ([place]) 01/02/2014  . CAD S/P high risk LAD PCI/DES 12/30/13 01/02/2014  . Pseudoaneurysm following PCI 01/02/2014  . Blood loss anemia 01/02/2014  . Acute systolic CHF (congestive heart failure) ([place])   . NSTEMI (non-ST  elevated myocardial infarction) ([place]) 12/26/2013  . Mitral regurgitation 12/26/2013  . Cardiomyopathy, ischemic-EF 15%-pre PCI 12/26/2013  . LBBB (left bundle branch block) 12/25/2013  . Asthma, persistent controlled 05/28/2013  . Upper airway cough syndrome 05/28/2013  . Cough 05/11/2013   Past Medical History:  Diagnosis Date  . Arthritis    "mostly my hands" (12/25/2013)  . Asthma   . Ischemic cardiomyopathy    a. EF 15%  . PONV (postoperative nausea and vomiting)   . Upper airway cough syndrome    Post-nasal drip    Family History  Problem Relation Age of Onset  . Cerebral aneurysm Mother        died at age 18  . Healthy Father        died of old age    Past Surgical History:  Procedure Laterality Date  . APPENDECTOMY  1960's  . CARDIAC CATHETERIZATION  12/28/2013   Procedure: RIGHT/LEFT HEART CATH AND CORONARY ANGIOGRAPHY;  Surgeon: Troy Sine, MD;  Location: [facility] CATH LAB;  Service: Cardiovascular;;  . CYSTOCELE REPAIR  12/18/2011   Procedure: ANTERIOR REPAIR (CYSTOCELE);  Surgeon: Gus Height, MD;  Location: [facility];  Service: Gynecology;  Laterality: N/A;  . JOINT REPLACEMENT    . JOINT REPLACEMENT Right 1980's   "ring finger"  . PERCUTANEOUS CORONARY STENT INTERVENTION (PCI-S) N/A 12/30/2013   Procedure: PERCUTANEOUS CORONARY STENT INTERVENTION (PCI-S);  Surgeon: Burnell Blanks, MD;  Location: [facility] CATH LAB;  Service: Cardiovascular;  Laterality: N/A;  . REPLACEMENT TOTAL KNEE BILATERAL Bilateral 2009  . TOTAL HIP ARTHROPLASTY Left 10/17/2018   Procedure: LEFT TOTAL HIP ARTHROPLASTY ANTERIOR APPROACH;  Surgeon: Mcarthur Rossetti, MD;  Location: [facility];  Service: Orthopedics;  Laterality: Left;   Social History   Occupational History  . Not on file  Tobacco Use  . Smoking status: Never Smoker  . Smokeless tobacco: Never Used  Vaping Use  . Vaping Use: Never used  Substance and Sexual Activity  . Alcohol use: No  . Drug use: No  . Sexual  activity: Not on file

## 2019-12-30 DIAGNOSIS — L570 Actinic keratosis: Secondary | ICD-10-CM | POA: Diagnosis not present

## 2020-01-03 ENCOUNTER — Encounter: Payer: Self-pay | Admitting: Nurse Practitioner

## 2020-01-03 DIAGNOSIS — R051 Acute cough: Secondary | ICD-10-CM | POA: Diagnosis not present

## 2020-01-03 DIAGNOSIS — R062 Wheezing: Secondary | ICD-10-CM | POA: Diagnosis not present

## 2020-01-04 ENCOUNTER — Telehealth: Payer: Self-pay | Admitting: Nurse Practitioner

## 2020-01-04 DIAGNOSIS — U071 COVID-19: Secondary | ICD-10-CM

## 2020-01-04 NOTE — Telephone Encounter (Signed)
Called to discuss with Grissel R Shira about Covid symptoms and the use of a monoclonal antibody infusion for those with mild to moderate Covid symptoms and at a high risk of hospitalization.     Pt is qualified for this infusion at the [facility] infusion center due to co-morbid conditions and/or a member of an at-risk group. I spoke to daughter and she says she and patient have appointments for mAB in [place] later today. She was appreciative of returned call.   Beckey Rutter, NP [phone] [email or web address]

## 2020-01-12 ENCOUNTER — Other Ambulatory Visit (HOSPITAL_COMMUNITY): Payer: Self-pay

## 2020-01-13 DIAGNOSIS — J209 Acute bronchitis, unspecified: Secondary | ICD-10-CM | POA: Diagnosis not present

## 2020-02-24 DIAGNOSIS — L570 Actinic keratosis: Secondary | ICD-10-CM | POA: Diagnosis not present

## 2020-03-28 ENCOUNTER — Ambulatory Visit: Payer: Medicare Other | Admitting: Orthopaedic Surgery

## 2020-03-30 ENCOUNTER — Encounter: Payer: Self-pay | Admitting: Orthopaedic Surgery

## 2020-03-30 ENCOUNTER — Ambulatory Visit: Payer: Medicare Other | Admitting: Orthopaedic Surgery

## 2020-03-30 DIAGNOSIS — M25511 Pain in right shoulder: Secondary | ICD-10-CM

## 2020-03-30 DIAGNOSIS — G8929 Other chronic pain: Secondary | ICD-10-CM

## 2020-03-30 DIAGNOSIS — M25512 Pain in left shoulder: Secondary | ICD-10-CM

## 2020-03-30 MED ORDER — METHYLPREDNISOLONE ACETATE 40 MG/ML IJ SUSP
40.0000 mg | INTRAMUSCULAR | Status: AC | PRN
  Administered 2020-03-30: 40 mg via INTRA_ARTICULAR

## 2020-03-30 MED ORDER — LIDOCAINE HCL 1 % IJ SOLN
3.0000 mL | INTRAMUSCULAR | Status: AC | PRN
Start: 2020-03-30 — End: 2020-03-30
  Administered 2020-03-30: 3 mL

## 2020-03-30 MED ORDER — LIDOCAINE HCL 1 % IJ SOLN
3.0000 mL | INTRAMUSCULAR | Status: AC | PRN
  Administered 2020-03-30: 3 mL

## 2020-03-30 MED ORDER — METHYLPREDNISOLONE ACETATE 40 MG/ML IJ SUSP
40.0000 mg | INTRAMUSCULAR | Status: AC | PRN
Start: 2020-03-30 — End: 2020-03-30
  Administered 2020-03-30: 40 mg via INTRA_ARTICULAR

## 2020-03-30 NOTE — Progress Notes (Signed)
   Procedure Note  Patient: Beth Johnston             Date of Birth: [birth year]           MRN: [id number]             Visit Date: 03/30/2020  Procedures: Visit Diagnoses:  1. Chronic pain of both shoulders     Large Joint Inj: R subacromial bursa on 03/30/2020 2:14 PM Indications: pain and diagnostic evaluation Details: 22 G 1.5 in needle  Arthrogram: No  Medications: 3 mL lidocaine 1 %; 40 mg methylPREDNISolone acetate 40 MG/ML Outcome: tolerated well, no immediate complications Procedure, treatment alternatives, risks and benefits explained, specific risks discussed. Consent was given by the patient. Immediately prior to procedure a time out was called to verify the correct patient, procedure, equipment, support staff and site/side marked as required. Patient was prepped and draped in the usual sterile fashion.   Large Joint Inj: L subacromial bursa on 03/30/2020 2:14 PM Indications: pain and diagnostic evaluation Details: 22 G 1.5 in needle  Arthrogram: No  Medications: 3 mL lidocaine 1 %; 40 mg methylPREDNISolone acetate 40 MG/ML Outcome: tolerated well, no immediate complications Procedure, treatment alternatives, risks and benefits explained, specific risks discussed. Consent was given by the patient. Immediately prior to procedure a time out was called to verify the correct patient, procedure, equipment, support staff and site/side marked as required. Patient was prepped and draped in the usual sterile fashion.      The patient is a 87 year old female well-known to me.  She has chronic bilateral shoulder pain and chronic osteoarthritis of both shoulders.  She gets steroid injections about every 3 months.  She is requesting those today.  She has had no other acute change in her medical status.  Her shoulders been hurting.  She is reluctant to pursue any further surgery.  Injections have helped her and she gets them about every 3 months.  I did place steroid injections in both  shoulders today without difficulty to help with her pain.  We can always repeat this in 3 months.

## 2020-05-25 ENCOUNTER — Other Ambulatory Visit: Payer: Self-pay | Admitting: Cardiovascular Disease

## 2020-06-30 ENCOUNTER — Encounter: Payer: Self-pay | Admitting: Orthopaedic Surgery

## 2020-06-30 ENCOUNTER — Ambulatory Visit: Payer: Medicare Other | Admitting: Orthopaedic Surgery

## 2020-06-30 DIAGNOSIS — G8929 Other chronic pain: Secondary | ICD-10-CM | POA: Diagnosis not present

## 2020-06-30 DIAGNOSIS — M25511 Pain in right shoulder: Secondary | ICD-10-CM

## 2020-06-30 DIAGNOSIS — M25512 Pain in left shoulder: Secondary | ICD-10-CM | POA: Diagnosis not present

## 2020-06-30 MED ORDER — LIDOCAINE HCL 1 % IJ SOLN
3.0000 mL | INTRAMUSCULAR | Status: AC | PRN
  Administered 2020-06-30: 3 mL

## 2020-06-30 MED ORDER — METHYLPREDNISOLONE ACETATE 40 MG/ML IJ SUSP
40.0000 mg | INTRAMUSCULAR | Status: AC | PRN
  Administered 2020-06-30: 40 mg via INTRA_ARTICULAR

## 2020-06-30 NOTE — Progress Notes (Signed)
Office Visit Note   Patient: Beth Johnston           Date of Birth: 1925/11/09           MRN: [id number] Visit Date: 06/30/2020              Requested by: No referring provider defined for this encounter. PCP: Patient, No Pcp Per (Inactive)   Assessment & Plan: Visit Diagnoses:  1. Chronic pain of both shoulders     Plan: I did provide a steroid injection in the subacromial space of both shoulders which was quite difficult.  All question concerns were answered and addressed.  We can do this again in 3 months if she would like.  Follow-Up Instructions: Return in about 3 months (around 09/30/2020).   Orders:  Orders Placed This Encounter  Procedures  . Large Joint Inj  . Large Joint Inj   No orders of the defined types were placed in this encounter.     Procedures: Large Joint Inj: R subacromial bursa on 06/30/2020 1:39 PM Indications: pain and diagnostic evaluation Details: 22 G 1.5 in needle  Arthrogram: No  Medications: 3 mL lidocaine 1 %; 40 mg methylPREDNISolone acetate 40 MG/ML Outcome: tolerated well, no immediate complications Procedure, treatment alternatives, risks and benefits explained, specific risks discussed. Consent was given by the patient. Immediately prior to procedure a time out was called to verify the correct patient, procedure, equipment, support staff and site/side marked as required. Patient was prepped and draped in the usual sterile fashion.   Large Joint Inj: L subacromial bursa on 06/30/2020 1:39 PM Indications: pain and diagnostic evaluation Details: 22 G 1.5 in needle  Arthrogram: No  Medications: 3 mL lidocaine 1 %; 40 mg methylPREDNISolone acetate 40 MG/ML Outcome: tolerated well, no immediate complications Procedure, treatment alternatives, risks and benefits explained, specific risks discussed. Consent was given by the patient. Immediately prior to procedure a time out was called to verify the correct patient, procedure, equipment,  support staff and site/side marked as required. Patient was prepped and draped in the usual sterile fashion.       Clinical Data: No additional findings.   Subjective: Chief Complaint  Patient presents with  . Right Shoulder - Follow-up  . Left Shoulder - Follow-up  The patient is a 87 year old female with severe osteoarthritis of both her shoulders.  She comes in every 3 months for steroid injections in both shoulders.  This is all that she wants to do for these.  She is not interested in shoulder replacement surgery.  She is not a diabetic and gets decent relief with steroid injections.  She would still rather just do this.  She ambulates without an assistive device.  She has had no acute change in her medical status.  HPI  Review of Systems There is currently listed no headache, chest pain, shortness of breath, fever, chills, nausea, vomiting  Objective: Vital Signs: There were no vitals taken for this visit.  Physical Exam She is alert and orient x3 and in no acute distress Ortho Exam Examination of both shoulder shows limited range of motion and weakness as well as pain throughout the arc of motion. Specialty Comments:  No specialty comments available.  Imaging: No results found.   PMFS History: Patient Active Problem List   Diagnosis Date Noted  . Status post total replacement of left hip 10/17/2018  . Unilateral primary osteoarthritis, left hip 03/24/2018  . Chronic pain of both shoulders 02/27/2016  . Acute  heart failure ([place])   . Elevated troponin   . Stented coronary artery   . Acute respiratory failure ([place]) 01/02/2014  . CAD S/P high risk LAD PCI/DES 12/30/13 01/02/2014  . Pseudoaneurysm following PCI 01/02/2014  . Blood loss anemia 01/02/2014  . Acute systolic CHF (congestive heart failure) ([place])   . NSTEMI (non-ST elevated myocardial infarction) ([place]) 12/26/2013  . Mitral regurgitation 12/26/2013  . Cardiomyopathy, ischemic-EF 15%-pre PCI 12/26/2013   . LBBB (left bundle branch block) 12/25/2013  . Asthma, persistent controlled 05/28/2013  . Upper airway cough syndrome 05/28/2013  . Cough 05/11/2013  . History of total knee replacement 09/04/2011   Past Medical History:  Diagnosis Date  . Arthritis    "mostly my hands" (12/25/2013)  . Asthma   . Ischemic cardiomyopathy    a. EF 15%  . PONV (postoperative nausea and vomiting)   . Upper airway cough syndrome    Post-nasal drip    Family History  Problem Relation Age of Onset  . Cerebral aneurysm Mother        died at age 49  . Healthy Father        died of old age    Past Surgical History:  Procedure Laterality Date  . APPENDECTOMY  1960's  . CARDIAC CATHETERIZATION  12/28/2013   Procedure: RIGHT/LEFT HEART CATH AND CORONARY ANGIOGRAPHY;  Surgeon: Troy Sine, MD;  Location: [facility] CATH LAB;  Service: Cardiovascular;;  . CYSTOCELE REPAIR  12/18/2011   Procedure: ANTERIOR REPAIR (CYSTOCELE);  Surgeon: Gus Height, MD;  Location: [facility];  Service: Gynecology;  Laterality: N/A;  . JOINT REPLACEMENT    . JOINT REPLACEMENT Right 1980's   "ring finger"  . PERCUTANEOUS CORONARY STENT INTERVENTION (PCI-S) N/A 12/30/2013   Procedure: PERCUTANEOUS CORONARY STENT INTERVENTION (PCI-S);  Surgeon: Burnell Blanks, MD;  Location: [facility] CATH LAB;  Service: Cardiovascular;  Laterality: N/A;  . REPLACEMENT TOTAL KNEE BILATERAL Bilateral 2009  . TOTAL HIP ARTHROPLASTY Left 10/17/2018   Procedure: LEFT TOTAL HIP ARTHROPLASTY ANTERIOR APPROACH;  Surgeon: Mcarthur Rossetti, MD;  Location: [facility];  Service: Orthopedics;  Laterality: Left;   Social History   Occupational History  . Not on file  Tobacco Use  . Smoking status: Never Smoker  . Smokeless tobacco: Never Used  Vaping Use  . Vaping Use: Never used  Substance and Sexual Activity  . Alcohol use: No  . Drug use: No  . Sexual activity: Not on file

## 2020-07-14 DIAGNOSIS — C44712 Basal cell carcinoma of skin of right lower limb, including hip: Secondary | ICD-10-CM | POA: Diagnosis not present

## 2020-07-14 DIAGNOSIS — C44519 Basal cell carcinoma of skin of other part of trunk: Secondary | ICD-10-CM | POA: Diagnosis not present

## 2020-07-14 DIAGNOSIS — L570 Actinic keratosis: Secondary | ICD-10-CM | POA: Diagnosis not present

## 2020-08-03 DIAGNOSIS — C44519 Basal cell carcinoma of skin of other part of trunk: Secondary | ICD-10-CM | POA: Diagnosis not present

## 2020-10-26 ENCOUNTER — Ambulatory Visit: Payer: Medicare Other | Admitting: Orthopaedic Surgery

## 2020-10-26 ENCOUNTER — Encounter: Payer: Self-pay | Admitting: Orthopaedic Surgery

## 2020-10-26 DIAGNOSIS — M25512 Pain in left shoulder: Secondary | ICD-10-CM

## 2020-10-26 DIAGNOSIS — G8929 Other chronic pain: Secondary | ICD-10-CM | POA: Diagnosis not present

## 2020-10-26 DIAGNOSIS — M25511 Pain in right shoulder: Secondary | ICD-10-CM | POA: Diagnosis not present

## 2020-10-26 MED ORDER — LIDOCAINE HCL 1 % IJ SOLN
3.0000 mL | INTRAMUSCULAR | Status: AC | PRN
  Administered 2020-10-26: 3 mL

## 2020-10-26 MED ORDER — METHYLPREDNISOLONE ACETATE 40 MG/ML IJ SUSP
40.0000 mg | INTRAMUSCULAR | Status: AC | PRN
  Administered 2020-10-26: 40 mg via INTRA_ARTICULAR

## 2020-10-26 NOTE — Progress Notes (Signed)
Office Visit Note   Patient: Beth Johnston           Date of Birth: [birth year]           MRN: [id number] Visit Date: 10/26/2020              Requested by: No referring provider defined for this encounter. PCP: Patient, No Pcp Per (Inactive)   Assessment & Plan: Visit Diagnoses:  1. Chronic pain of both shoulders     Plan: Per the patient's request I did provide a steroid injection in both shoulders and the subacromial outlet which she tolerated well.  All questions and concerns were answered and addressed.  She would like this repeated again in 3 months which I think is reasonable.  Follow-Up Instructions: Return in about 3 months (around 01/25/2021).   Orders:  Orders Placed This Encounter  Procedures   Large Joint Inj   Large Joint Inj   No orders of the defined types were placed in this encounter.     Procedures: Large Joint Inj: R subacromial bursa on 10/26/2020 8:39 AM Indications: pain and diagnostic evaluation Details: 22 G 1.5 in needle  Arthrogram: No  Medications: 3 mL lidocaine 1 %; 40 mg methylPREDNISolone acetate 40 MG/ML Outcome: tolerated well, no immediate complications Procedure, treatment alternatives, risks and benefits explained, specific risks discussed. Consent was given by the patient. Immediately prior to procedure a time out was called to verify the correct patient, procedure, equipment, support staff and site/side marked as required. Patient was prepped and draped in the usual sterile fashion.    Large Joint Inj: L subacromial bursa on 10/26/2020 8:39 AM Indications: pain and diagnostic evaluation Details: 22 G 1.5 in needle  Arthrogram: No  Medications: 3 mL lidocaine 1 %; 40 mg methylPREDNISolone acetate 40 MG/ML Outcome: tolerated well, no immediate complications Procedure, treatment alternatives, risks and benefits explained, specific risks discussed. Consent was given by the patient. Immediately prior to procedure a time out was  called to verify the correct patient, procedure, equipment, support staff and site/side marked as required. Patient was prepped and draped in the usual sterile fashion.      Clinical Data: No additional findings.   Subjective: Chief Complaint  Patient presents with   Right Shoulder - Pain   Left Shoulder - Pain  The patient comes in today requesting bilateral shoulder subacromial injections with steroids.  She has these about every 3 to 4 months.  She is 87 and will be 89 in November.  She has had no acute change in medical status.  She has severe impingement of both shoulders with arthritic changes as well as rotator cuff disease.  She says the injections help her quite a bit.  HPI  Review of Systems Today she denies any headache, chest pain, shortness of breath, fever, chills, nausea, vomiting  Objective: Vital Signs: There were no vitals taken for this visit.  Physical Exam She is alert and orient x3 and in no acute distress Ortho Exam Examination of both shoulder shows that they are well located but they will have pain throughout the arc of motion.  There is weakness in the rotator cuff bilaterally. Specialty Comments:  No specialty comments available.  Imaging: No results found.   PMFS History: Patient Active Problem List   Diagnosis Date Noted   Status post total replacement of left hip 10/17/2018   Unilateral primary osteoarthritis, left hip 03/24/2018   Chronic pain of both shoulders 02/27/2016   Acute heart  failure (HCC)    Elevated troponin    Stented coronary artery    Acute respiratory failure ([place]) 01/02/2014   CAD S/P high risk LAD PCI/DES 12/30/13 01/02/2014   Pseudoaneurysm following PCI 01/02/2014   Blood loss anemia 62/83/6629   Acute systolic CHF (congestive heart failure) (HCC)    NSTEMI (non-ST elevated myocardial infarction) ([place]) 12/26/2013   Mitral regurgitation 12/26/2013   Cardiomyopathy, ischemic-EF 15%-pre PCI 12/26/2013   LBBB (left  bundle branch block) 12/25/2013   Asthma, persistent controlled 05/28/2013   Upper airway cough syndrome 05/28/2013   Cough 05/11/2013   History of total knee replacement 09/04/2011   Past Medical History:  Diagnosis Date   Arthritis    "mostly my hands" (12/25/2013)   Asthma    Ischemic cardiomyopathy    a. EF 15%   PONV (postoperative nausea and vomiting)    Upper airway cough syndrome    Post-nasal drip    Family History  Problem Relation Age of Onset   Cerebral aneurysm Mother        died at age 53   Healthy Father        died of old age    Past Surgical History:  Procedure Laterality Date   APPENDECTOMY  58's   CARDIAC CATHETERIZATION  12/28/2013   Procedure: RIGHT/LEFT HEART CATH AND CORONARY ANGIOGRAPHY;  Surgeon: Troy Sine, MD;  Location: [facility] CATH LAB;  Service: Cardiovascular;;   CYSTOCELE REPAIR  12/18/2011   Procedure: ANTERIOR REPAIR (CYSTOCELE);  Surgeon: Gus Height, MD;  Location: [facility];  Service: Gynecology;  Laterality: N/A;   JOINT REPLACEMENT     JOINT REPLACEMENT Right 1980's   "ring finger"   PERCUTANEOUS CORONARY STENT INTERVENTION (PCI-S) N/A 12/30/2013   Procedure: PERCUTANEOUS CORONARY STENT INTERVENTION (PCI-S);  Surgeon: Burnell Blanks, MD;  Location: [facility] CATH LAB;  Service: Cardiovascular;  Laterality: N/A;   REPLACEMENT TOTAL KNEE BILATERAL Bilateral 2009   TOTAL HIP ARTHROPLASTY Left 10/17/2018   Procedure: LEFT TOTAL HIP ARTHROPLASTY ANTERIOR APPROACH;  Surgeon: Mcarthur Rossetti, MD;  Location: [facility];  Service: Orthopedics;  Laterality: Left;   Social History   Occupational History   Not on file  Tobacco Use   Smoking status: Never   Smokeless tobacco: Never  Vaping Use   Vaping Use: Never used  Substance and Sexual Activity   Alcohol use: No   Drug use: No   Sexual activity: Not on file

## 2020-11-08 ENCOUNTER — Other Ambulatory Visit: Payer: Self-pay | Admitting: Cardiovascular Disease

## 2020-11-09 DIAGNOSIS — Z85828 Personal history of other malignant neoplasm of skin: Secondary | ICD-10-CM | POA: Diagnosis not present

## 2020-11-09 DIAGNOSIS — L814 Other melanin hyperpigmentation: Secondary | ICD-10-CM | POA: Diagnosis not present

## 2020-11-09 DIAGNOSIS — L821 Other seborrheic keratosis: Secondary | ICD-10-CM | POA: Diagnosis not present

## 2020-11-09 DIAGNOSIS — C44722 Squamous cell carcinoma of skin of right lower limb, including hip: Secondary | ICD-10-CM | POA: Diagnosis not present

## 2020-11-09 DIAGNOSIS — D485 Neoplasm of uncertain behavior of skin: Secondary | ICD-10-CM | POA: Diagnosis not present

## 2020-11-09 DIAGNOSIS — D0472 Carcinoma in situ of skin of left lower limb, including hip: Secondary | ICD-10-CM | POA: Diagnosis not present

## 2020-11-09 DIAGNOSIS — L570 Actinic keratosis: Secondary | ICD-10-CM | POA: Diagnosis not present

## 2020-11-28 DIAGNOSIS — D0472 Carcinoma in situ of skin of left lower limb, including hip: Secondary | ICD-10-CM | POA: Diagnosis not present

## 2020-12-22 ENCOUNTER — Other Ambulatory Visit: Payer: Self-pay | Admitting: Cardiovascular Disease

## 2020-12-27 ENCOUNTER — Encounter: Payer: Self-pay | Admitting: Cardiovascular Disease

## 2020-12-27 MED ORDER — CLOPIDOGREL BISULFATE 75 MG PO TABS: 75.0000 mg | ORAL_TABLET | Freq: Every day | ORAL | 0 refills | Status: DC

## 2020-12-27 MED ORDER — FUROSEMIDE 40 MG PO TABS: 40.0000 mg | ORAL_TABLET | Freq: Every day | ORAL | 0 refills | Status: DC

## 2020-12-27 MED ORDER — LOSARTAN POTASSIUM 25 MG PO TABS: 25.0000 mg | ORAL_TABLET | Freq: Every day | ORAL | 0 refills | Status: DC

## 2020-12-27 MED ORDER — ATORVASTATIN CALCIUM 40 MG PO TABS: ORAL_TABLET | ORAL | 0 refills | Status: DC

## 2020-12-27 MED ORDER — CARVEDILOL 6.25 MG PO TABS: 6.2500 mg | ORAL_TABLET | Freq: Two times a day (BID) | ORAL | 0 refills | Status: DC

## 2020-12-27 MED ORDER — SPIRONOLACTONE 25 MG PO TABS: ORAL_TABLET | ORAL | 0 refills | Status: DC

## 2020-12-27 NOTE — Telephone Encounter (Signed)
Pt's medications were sent to pt's pharmacy as requested. Confirmation received.  

## 2020-12-27 NOTE — Addendum Note (Signed)
Addended by: Carter Kitten D on: 12/27/2020 12:55 PM   Modules accepted: Orders

## 2021-01-10 ENCOUNTER — Telehealth: Payer: Medicare Other | Admitting: Physician Assistant

## 2021-01-10 DIAGNOSIS — J208 Acute bronchitis due to other specified organisms: Secondary | ICD-10-CM

## 2021-01-10 DIAGNOSIS — B9689 Other specified bacterial agents as the cause of diseases classified elsewhere: Secondary | ICD-10-CM | POA: Diagnosis not present

## 2021-01-10 MED ORDER — DOXYCYCLINE HYCLATE 100 MG PO TABS: 100.0000 mg | ORAL_TABLET | Freq: Two times a day (BID) | ORAL | 0 refills | Status: DC

## 2021-01-10 MED ORDER — BENZONATATE 100 MG PO CAPS: 100.0000 mg | ORAL_CAPSULE | Freq: Three times a day (TID) | ORAL | 0 refills | Status: DC | PRN

## 2021-01-10 NOTE — Progress Notes (Signed)

## 2021-01-10 NOTE — Progress Notes (Signed)
I have spent 5 minutes in review of e-visit questionnaire, review and updating patient chart, medical decision making and response to patient.   Hedy Garro Cody Theodis Kinsel, PA-C    

## 2021-01-25 ENCOUNTER — Encounter: Payer: Self-pay | Admitting: Orthopaedic Surgery

## 2021-01-25 ENCOUNTER — Other Ambulatory Visit: Payer: Self-pay

## 2021-01-25 ENCOUNTER — Ambulatory Visit: Payer: Medicare Other | Admitting: Orthopaedic Surgery

## 2021-01-25 DIAGNOSIS — M25512 Pain in left shoulder: Secondary | ICD-10-CM

## 2021-01-25 DIAGNOSIS — G8929 Other chronic pain: Secondary | ICD-10-CM

## 2021-01-25 DIAGNOSIS — M25511 Pain in right shoulder: Secondary | ICD-10-CM | POA: Diagnosis not present

## 2021-01-25 MED ORDER — LIDOCAINE HCL 1 % IJ SOLN
5.0000 mL | INTRAMUSCULAR | Status: AC | PRN
  Administered 2021-01-25: 09:00:00 5 mL

## 2021-01-25 MED ORDER — METHYLPREDNISOLONE ACETATE 40 MG/ML IJ SUSP
40.0000 mg | INTRAMUSCULAR | Status: AC | PRN
  Administered 2021-01-25: 09:00:00 40 mg via INTRA_ARTICULAR

## 2021-01-25 MED ORDER — LIDOCAINE HCL 1 % IJ SOLN
5.0000 mL | INTRAMUSCULAR | Status: AC | PRN
Start: 2021-01-25 — End: 2021-01-25
  Administered 2021-01-25: 09:00:00 5 mL

## 2021-01-25 NOTE — Progress Notes (Signed)
Office Visit Note   Patient: Beth Johnston           Date of Birth: [birth year]           MRN: [id number] Visit Date: 01/25/2021              Requested by: No referring provider defined for this encounter. PCP: Patient, No Pcp Per (Inactive)   Assessment & Plan: Visit Diagnoses:  1. Chronic pain of both shoulders     Plan: Per the patient's request I did place a steroid injection in both shoulder subacromial outlets which she tolerated well.  We can do this again in 3 months.  Follow-Up Instructions: Return in about 3 months (around 04/25/2021).   Orders:  Orders Placed This Encounter  Procedures   Large Joint Inj   No orders of the defined types were placed in this encounter.     Procedures: Large Joint Inj: bilateral subacromial bursa on 01/25/2021 8:31 AM Indications: pain and diagnostic evaluation Details: 22 G 1.5 in needle  Arthrogram: No  Medications (Right): 5 mL lidocaine 1 %; 40 mg methylPREDNISolone acetate 40 MG/ML Medications (Left): 5 mL lidocaine 1 %; 40 mg methylPREDNISolone acetate 40 MG/ML Outcome: tolerated well, no immediate complications Procedure, treatment alternatives, risks and benefits explained, specific risks discussed. Consent was given by the patient. Immediately prior to procedure a time out was called to verify the correct patient, procedure, equipment, support staff and site/side marked as required. Patient was prepped and draped in the usual sterile fashion.      Clinical Data: No additional findings.   Subjective: Chief Complaint  Patient presents with   Right Shoulder - Follow-up   Left Shoulder - Follow-up  The patient is well-known to me.  She is 87 years old and has chronic bilateral shoulder pain.  She comes in every 3 months for steroid injections in both shoulder subacromial outlets.  She is requesting those again today.  She has had no acute change in her medical status.  She says the injections helped her  greatly.  HPI  Review of Systems There is currently listed no fever, chills, nausea, vomiting  Objective: Vital Signs: There were no vitals taken for this visit.  Physical Exam She is alert and orient x3 and in no acute distress Ortho Exam Examination of both shoulder shows limited abduction but they are both well located and has some grinding of the glenohumeral joint. Specialty Comments:  No specialty comments available.  Imaging: No results found.   PMFS History: Patient Active Problem List   Diagnosis Date Noted   Status post total replacement of left hip 10/17/2018   Unilateral primary osteoarthritis, left hip 03/24/2018   Chronic pain of both shoulders 02/27/2016   Acute heart failure (HCC)    Elevated troponin    Stented coronary artery    Acute respiratory failure ([place]) 01/02/2014   CAD S/P high risk LAD PCI/DES 12/30/13 01/02/2014   Pseudoaneurysm following PCI 01/02/2014   Blood loss anemia 97/41/6384   Acute systolic CHF (congestive heart failure) (HCC)    NSTEMI (non-ST elevated myocardial infarction) ([place]) 12/26/2013   Mitral regurgitation 12/26/2013   Cardiomyopathy, ischemic-EF 15%-pre PCI 12/26/2013   LBBB (left bundle branch block) 12/25/2013   Asthma, persistent controlled 05/28/2013   Upper airway cough syndrome 05/28/2013   Cough 05/11/2013   History of total knee replacement 09/04/2011   Past Medical History:  Diagnosis Date   Arthritis    "mostly my hands" (12/25/2013)  Asthma    Ischemic cardiomyopathy    a. EF 15%   PONV (postoperative nausea and vomiting)    Upper airway cough syndrome    Post-nasal drip    Family History  Problem Relation Age of Onset   Cerebral aneurysm Mother        died at age 73   Healthy Father        died of old age    Past Surgical History:  Procedure Laterality Date   APPENDECTOMY  22's   CARDIAC CATHETERIZATION  12/28/2013   Procedure: RIGHT/LEFT HEART CATH AND CORONARY ANGIOGRAPHY;  Surgeon:  Troy Sine, MD;  Location: [facility] C[facility] LAB;  Service: Cardiovascular;;   CYSTOCELE REPAIR  12/18/2011   Procedure: ANTERIOR REPAIR (CYSTOCELE);  Surgeon: Gus Height, MD;  Location: [facility];  Service: Gynecology;  Laterality: N/A;   JOINT REPLACEMENT     JOINT REPLACEMENT Right 1980's   "ring finger"   PERCUTANEOUS CORONARY STENT INTERVENTION (PCI-S) N/A 12/30/2013   Procedure: PERCUTANEOUS CORONARY STENT INTERVENTION (PCI-S);  Surgeon: Burnell Blanks, MD;  Location: [facility] CATH LAB;  Service: Cardiovascular;  Laterality: N/A;   REPLACEMENT TOTAL KNEE BILATERAL Bilateral 2009   TOTAL HIP ARTHROPLASTY Left 10/17/2018   Procedure: LEFT TOTAL HIP ARTHROPLASTY ANTERIOR APPROACH;  Surgeon: Mcarthur Rossetti, MD;  Location: [facility];  Service: Orthopedics;  Laterality: Left;   Social History   Occupational History   Not on file  Tobacco Use   Smoking status: Never   Smokeless tobacco: Never  Vaping Use   Vaping Use: Never used  Substance and Sexual Activity   Alcohol use: No   Drug use: No   Sexual activity: Not on file

## 2021-03-01 ENCOUNTER — Other Ambulatory Visit: Payer: Self-pay | Admitting: Cardiovascular Disease

## 2021-03-15 ENCOUNTER — Ambulatory Visit: Payer: Medicare Other | Admitting: Physician Assistant

## 2021-03-15 ENCOUNTER — Other Ambulatory Visit: Payer: Self-pay | Admitting: *Deleted

## 2021-03-15 ENCOUNTER — Encounter: Payer: Self-pay | Admitting: Physician Assistant

## 2021-03-15 ENCOUNTER — Other Ambulatory Visit: Payer: Self-pay

## 2021-03-15 VITALS — BP 110/62 | HR 76 | Ht 65.0 in | Wt 157.8 lb

## 2021-03-15 DIAGNOSIS — I340 Nonrheumatic mitral (valve) insufficiency: Secondary | ICD-10-CM

## 2021-03-15 DIAGNOSIS — I350 Nonrheumatic aortic (valve) stenosis: Secondary | ICD-10-CM

## 2021-03-15 DIAGNOSIS — I2510 Atherosclerotic heart disease of native coronary artery without angina pectoris: Secondary | ICD-10-CM | POA: Diagnosis not present

## 2021-03-15 DIAGNOSIS — E7800 Pure hypercholesterolemia, unspecified: Secondary | ICD-10-CM | POA: Insufficient documentation

## 2021-03-15 DIAGNOSIS — I5020 Unspecified systolic (congestive) heart failure: Secondary | ICD-10-CM

## 2021-03-15 HISTORY — DX: Nonrheumatic aortic (valve) stenosis: I35.0

## 2021-03-15 HISTORY — DX: Pure hypercholesterolemia, unspecified: E78.00

## 2021-03-15 NOTE — Assessment & Plan Note (Signed)
She is a very active 88 year old.  I think she would be a candidate for MitraClip if she needed intervention.  She currently has no symptoms to suggest significant worsening.  I will obtain a follow-up echocardiogram.

## 2021-03-15 NOTE — Patient Instructions (Signed)
Medication Instructions:   Your physician recommends that you continue on your current medications as directed. Please refer to the Current Medication list given to you today.   *If you need a refill on your cardiac medications before your next appointment, please call your pharmacy*   Lab Work:  Your physician recommends that you return for a FASTING lipid profile/CMET at PCP. Fasting from midnight the night before.     If you have labs (blood work) drawn today and your tests are completely normal, you will receive your results only by: [place] (if you have MyChart) OR A paper copy in the mail If you have any lab test that is abnormal or we need to change your treatment, we will call you to review the results.   Testing/Procedures:  Your physician has requested that you have an echocardiogram. Echocardiography is a painless test that uses sound waves to create images of your heart. It provides your doctor with information about the size and shape of your heart and how well your hearts chambers and valves are working. This procedure takes approximately one hour. There are no restrictions for this procedure.    Follow-Up: At [facility], you and your health needs are our priority.  As part of our continuing mission to provide you with exceptional heart care, we have created designated Provider Care Teams.  These Care Teams include your primary Cardiologist (physician) and Advanced Practice Providers (APPs -  Physician Assistants and Nurse Practitioners) who all work together to provide you with the care you need, when you need it.  We recommend signing up for the patient portal called "MyChart".  Sign up information is provided on this After Visit Summary.  MyChart is used to connect with patients for Virtual Visits (Telemedicine).  Patients are able to view lab/test results, encounter notes, upcoming appointments, etc.  Non-urgent messages can be sent to your provider as  well.   To learn more about what you can do with MyChart, go to [email or web address].    Your next appointment:   1 year(s)  The format for your next appointment:   In Person  Provider:   Lauree Chandler, MD     Other Instructions  Your physician wants you to follow-up in: 1 year with Dr.McAlhany.  You will receive a reminder letter in the mail two months in advance. If you don't receive a letter, please call our office to schedule the follow-up appointment.

## 2021-03-15 NOTE — Progress Notes (Signed)
Cardiology Office Note:    Date:  03/15/2021   ID:  Beth Johnston, DOB 11-04-1925, MRN [id number]  PCP:  Patient, No Pcp Per (Inactive)  [facility] Providers Cardiologist:  Lauree Chandler, MD     Referring MD: No ref. provider found   Chief Complaint:  F/u  for CAD, CHF, valvular heart disease    Patient Profile: Specialty Problems       Cardiology Problems   LBBB (left bundle branch block)   hx of NSTEMI in 11/15 s/p DES x 2 to LAD w Impella support   Ischemic dilated cardiomyopathy (HCC)   Moderate mitral regurgitation    Echo 5/19: Mild MR Echo 7/21: Moderate MR      HFrEF (heart failure with reduced ejection fraction) (HCC)    Ischemic cardiomyopathy  Hx of hyper K+ w Spironolactone; able to tolerate since Echo 11/15: EF 10 Echo 5/19: EF 30-35 Echo 7/21: EF 30-35      CAD (coronary artery disease)    S/p NSTEMI in 11/2013 Pt declined CABG PCI:  Rotablator atherectomy and DES x 2 to LAD w Impella support  Cardiac catheterization 12/28/2013:  LM normal / LAD proximal 80 diffuse; D1 80, D2 70-80 / LCx patent / RCA 100 / EF 10-15 / PCI: Rotablator atherectomy and DES x2 to the LAD       Pseudoaneurysm following PCI   Moderate Aortic stenosis    Echocardiogram 06/04/2017: Moderate LVH, EF 30-35, dynamic obstruction with peak gradient 61 mmHg, anteroseptal/inferior/inferoseptal AK, AV mean gradient 12 mmHg, DI 0.33, AV peak velocity 238 cm/s, mild MR (likely LGLF severe AS) Echocardiogram 08/24/2019: EF 30-35, mild LVH, GR 1 DD, normal RVSF, mild LAE, moderate MR, mean AV gradient 10 mmHg, DI 0.37, AV V-max 194 cm/s      Pure hypercholesterolemia     History of Present Illness:   Beth Johnston is a 88 y.o. female with the above problem list.  She was last seen in 10/21.  She returns for f/u.  She is here with her granddaughter.  She remains very active.  She walks every day.  She is able to do housework.  She also takes care of grandchildren.  She has not  had chest pain, shortness of breath, syncope, orthopnea, leg edema.  Her weights remain stable.        Past Medical History:  Diagnosis Date   Arthritis    "mostly my hands" (12/25/2013)   Asthma    CAD (coronary artery disease) 01/02/2014   S/p NSTEMI in 11/2013 Pt declined CABG PCI:  Rotablator atherectomy and DES x 2 to LAD w Impella support     HFrEF (heart failure with reduced ejection fraction) ([place])    Ischemic cardiomyopathy  Intol of Spironolactone due to hyper K+ Echo 11/15: EF 10 Echo 5/19: EF 30-35 Echo 7/21: EF 30-35   Ischemic cardiomyopathy    a. EF 15%   LBBB (left bundle branch block) 12/25/2013   Mitral regurgitation 12/26/2013   Echo 5/19: Mild MR Echo 7/21: Moderate MR   Moderate Aortic stenosis 03/15/2021   Echocardiogram 06/04/2017: Moderate LVH, EF 30-35, dynamic obstruction with peak gradient 61 mmHg, anteroseptal/inferior/inferoseptal AK, AV mean gradient 12 mmHg, DI 0.33, AV peak velocity 238 cm/s, mild MR Echocardiogram 08/24/2019: EF 30-35, mild LVH, GR 1 DD, normal RVSF, mild LAE, moderate MR, mean AV gradient 10 mmHg, DI 0.37, AV V-max 194 cm/s   PONV (postoperative nausea and vomiting)    Pure hypercholesterolemia 03/15/2021  Upper airway cough syndrome    Post-nasal drip   Current Medications: Current Meds  Medication Sig   acetaminophen (TYLENOL) 650 MG CR tablet Take 1,300 mg by mouth 2 (two) times daily.   atorvastatin (LIPITOR) 40 MG tablet TAKE ONE-HALF TABLET BY MOUTH  ONCE DAILY AT 6 PM   bisacodyl (DULCOLAX) 5 MG EC tablet Take 10 mg by mouth daily.   carvedilol (COREG) 6.25 MG tablet TAKE 1 TABLET BY MOUTH TWICE  DAILY WITH MEALS   clopidogrel (PLAVIX) 75 MG tablet TAKE 1 TABLET BY MOUTH DAILY  WITH BREAKFAST   furosemide (LASIX) 40 MG tablet TAKE 1 TABLET BY MOUTH DAILY   losartan (COZAAR) 25 MG tablet TAKE 1 TABLET BY MOUTH DAILY   spironolactone (ALDACTONE) 25 MG tablet TAKE ONE-HALF TABLET BY MOUTH  TWICE DAILY AT 8 AM AND 6 PM    Allergies:    Codeine, Lisinopril, and Tramadol   Social History   Tobacco Use   Smoking status: Never   Smokeless tobacco: Never  Vaping Use   Vaping Use: Never used  Substance Use Topics   Alcohol use: No   Drug use: No    Family Hx: The patient's family history includes Cerebral aneurysm in her mother; Healthy in her father.  ROS see HPI  EKGs/Labs/Other Test Reviewed:    EKG:  EKG is   ordered today.  The ekg ordered today demonstrates NSR, HR 76, LBBB  Recent Labs: No results found for requested labs within last 8760 hours.   Recent Lipid Panel No results for input(s): CHOL, TRIG, HDL, VLDL, LDLCALC, LDLDIRECT in the last 8760 hours.   Risk Assessment/Calculations:         Physical Exam:    VS:  BP 110/62 (BP Location: Right Arm, Patient Position: Sitting)    Pulse 76    Ht 5' 5"  (1.651 m)    Wt 157 lb 12.8 oz (71.6 kg)    SpO2 99%    BMI 26.26 kg/m     Wt Readings from Last 3 Encounters:  03/15/21 157 lb 12.8 oz (71.6 kg)  11/11/19 159 lb 12.8 oz (72.5 kg)  07/29/19 161 lb (73 kg)    Constitutional:      Appearance: Healthy appearance. Not in distress.  Neck:     Vascular: No JVR. JVD normal.  Pulmonary:     Effort: Pulmonary effort is normal.     Breath sounds: No wheezing. No rales.  Cardiovascular:     Normal rate. Regular rhythm. Normal S1. Normal S2.      Murmurs: There is a grade 2/6 crescendo-decrescendo systolic murmur at the URSB.  Edema:    Peripheral edema absent.  Abdominal:     Palpations: Abdomen is soft.  Skin:    General: Skin is warm and dry.  Neurological:     Mental Status: Alert and oriented to person, place and time.     Cranial Nerves: Cranial nerves are intact.        ASSESSMENT & PLAN:   CAD (coronary artery disease) History of non-STEMI in 2015 treated with high risk PCI to the LAD with DES x2 and Impella support.  She is doing very well.  She remains quite active.  She has not had anginal symptoms.  Continue clopidogrel 75 mg daily,  atorvastatin 40 mg daily, carvedilol 6.25 mg twice daily, losartan 25 mg daily.  Follow-up 1 year.  HFrEF (heart failure with reduced ejection fraction) (HCC) Ischemic cardiomyopathy.  EF by echocardiogram in  2021 was 30-35.  She is NYHA II.  Volume status is stable.  Continue carvedilol 6.25 mg twice daily, losartan 25 mg daily, spironolactone 12.5 mg twice daily.  She has an appointment with her PCP next month.  Arrange follow-up CMET at that time.  Moderate mitral regurgitation She is a very active 88 year old.  I think she would be a candidate for MitraClip if she needed intervention.  She currently has no symptoms to suggest significant worsening.  I will obtain a follow-up echocardiogram.  Moderate Aortic stenosis No symptoms to suggest worsening.  Her last echo in 2021 demonstrated mean gradient 10 mmHg.  Her dimensionless index at that time was 0.37.  As noted, she seems to be a good candidate for intervention if needed.  We briefly discussed the TAVR procedure.  I will arrange follow-up echocardiogram.  Follow-up 1 year.  Pure hypercholesterolemia Continue atorvastatin 40 mg daily.  Arrange fasting CMET, lipids when she sees her PCP next month.         Dispo:  Return in about 1 year (around 03/15/2022) for Routine Follow Up with Dr. Angelena Form.   Medication Adjustments/Labs and Tests Ordered: Current medicines are reviewed at length with the patient today.  Concerns regarding medicines are outlined above.  Tests Ordered: Orders Placed This Encounter  Procedures   Comp Met (CMET)   Lipid Profile   EKG 12-Lead   ECHOCARDIOGRAM COMPLETE   Medication Changes: No orders of the defined types were placed in this encounter.  Signed, Richardson Dopp, PA-C  03/15/2021 2:33 PM    [facility] [address], [place], [place]  [address] Phone: [phone]; Fax: [phone]

## 2021-03-15 NOTE — Assessment & Plan Note (Signed)
Continue atorvastatin 40 mg daily.  Arrange fasting CMET, lipids when she sees her PCP next month.

## 2021-03-15 NOTE — Assessment & Plan Note (Signed)
No symptoms to suggest worsening.  Her last echo in 2021 demonstrated mean gradient 10 mmHg.  Her dimensionless index at that time was 0.37.  As noted, she seems to be a good candidate for intervention if needed.  We briefly discussed the TAVR procedure.  I will arrange follow-up echocardiogram.  Follow-up 1 year.

## 2021-03-15 NOTE — Assessment & Plan Note (Signed)
History of non-STEMI in 2015 treated with high risk PCI to the LAD with DES x2 and Impella support.  She is doing very well.  She remains quite active.  She has not had anginal symptoms.  Continue clopidogrel 75 mg daily, atorvastatin 40 mg daily, carvedilol 6.25 mg twice daily, losartan 25 mg daily.  Follow-up 1 year.

## 2021-03-15 NOTE — Assessment & Plan Note (Signed)
Ischemic cardiomyopathy.  EF by echocardiogram in 2021 was 30-35.  She is NYHA II.  Volume status is stable.  Continue carvedilol 6.25 mg twice daily, losartan 25 mg daily, spironolactone 12.5 mg twice daily.  She has an appointment with her PCP next month.  Arrange follow-up CMET at that time.

## 2021-03-30 ENCOUNTER — Other Ambulatory Visit: Payer: Self-pay

## 2021-03-30 ENCOUNTER — Ambulatory Visit (HOSPITAL_COMMUNITY): Payer: Medicare Other | Attending: Cardiology

## 2021-03-30 DIAGNOSIS — I5020 Unspecified systolic (congestive) heart failure: Secondary | ICD-10-CM | POA: Insufficient documentation

## 2021-03-30 DIAGNOSIS — I340 Nonrheumatic mitral (valve) insufficiency: Secondary | ICD-10-CM | POA: Diagnosis not present

## 2021-03-30 DIAGNOSIS — I350 Nonrheumatic aortic (valve) stenosis: Secondary | ICD-10-CM | POA: Insufficient documentation

## 2021-03-30 DIAGNOSIS — I2510 Atherosclerotic heart disease of native coronary artery without angina pectoris: Secondary | ICD-10-CM | POA: Diagnosis present

## 2021-03-30 LAB — ECHOCARDIOGRAM COMPLETE
AR max vel: 1.14 cm2
AV Area VTI: 1.08 cm2
AV Area mean vel: 0.96 cm2
AV Mean grad: 10 mmHg
AV Peak grad: 15.2 mmHg
Ao pk vel: 1.95 m/s
Area-P 1/2: 4.89 cm2
MV VTI: 1.09 cm2
S' Lateral: 3.5 cm

## 2021-04-09 ENCOUNTER — Telehealth: Payer: Medicare Other | Admitting: Family

## 2021-04-09 DIAGNOSIS — R051 Acute cough: Secondary | ICD-10-CM | POA: Diagnosis not present

## 2021-04-09 MED ORDER — BENZONATATE 100 MG PO CAPS: 100.0000 mg | ORAL_CAPSULE | Freq: Three times a day (TID) | ORAL | 0 refills | Status: DC | PRN

## 2021-04-09 MED ORDER — AZITHROMYCIN 250 MG PO TABS: ORAL_TABLET | ORAL | 0 refills | Status: DC

## 2021-04-09 NOTE — Progress Notes (Signed)
We are sorry that you are not feeling well.  Here is how we plan to help! ? ?Based on your presentation I believe you most likely have A cough due to bacteria.  When patients have a fever and a productive cough with a change in color or increased sputum production, we are concerned about bacterial bronchitis.  If left untreated it can progress to pneumonia.  If your symptoms do not improve with your treatment plan it is important that you contact your provider.   I have prescribed Azithromyin 250 mg: two tablets now and then one tablet daily for 4 additonal days  ?  ?In addition you may use A non-prescription cough medication called Robitussin DAC. Take 2 teaspoons every 8 hours or Delsym: take 2 teaspoons every 12 hours., A non-prescription cough medication called Mucinex DM: take 2 tablets every 12 hours., and A prescription cough medication called Tessalon Perles '100mg'$ . You may take 1-2 capsules every 8 hours as needed for your cough. ? ? ? ?From your responses in the eVisit questionnaire you describe inflammation in the upper respiratory tract which is causing a significant cough.  This is commonly called Bronchitis and has four common causes:   ?Allergies ?Viral Infections ?Acid Reflux ?Bacterial Infection ?Allergies, viruses and acid reflux are treated by controlling symptoms or eliminating the cause. An example might be a cough caused by taking certain blood pressure medications. You stop the cough by changing the medication. Another example might be a cough caused by acid reflux. Controlling the reflux helps control the cough. ? ?USE OF BRONCHODILATOR ("RESCUE") INHALERS: ?There is a risk from using your bronchodilator too frequently.  The risk is that over-reliance on a medication which only relaxes the muscles surrounding the breathing tubes can reduce the effectiveness of medications prescribed to reduce swelling and congestion of the tubes themselves.  Although you feel brief relief from the  bronchodilator inhaler, your asthma may actually be worsening with the tubes becoming more swollen and filled with mucus.  This can delay other crucial treatments, such as oral steroid medications. If you need to use a bronchodilator inhaler daily, several times per day, you should discuss this with your provider.  There are probably better treatments that could be used to keep your asthma under control.  ?   ?HOME CARE ?Only take medications as instructed by your medical team. ?Complete the entire course of an antibiotic. ?Drink plenty of fluids and get plenty of rest. ?Avoid close contacts especially the very young and the elderly ?Cover your mouth if you cough or cough into your sleeve. ?Always remember to wash your hands ?A steam or ultrasonic humidifier can help congestion.  ? ?GET HELP RIGHT AWAY IF: ?You develop worsening fever. ?You become short of breath ?You cough up blood. ?Your symptoms persist after you have completed your treatment plan ?MAKE SURE YOU  ?Understand these instructions. ?Will watch your condition. ?Will get help right away if you are not doing well or get worse. ?  ? ?Thank you for choosing an e-visit. ? ?Your e-visit answers were reviewed by a board certified advanced clinical practitioner to complete your personal care plan. Depending upon the condition, your plan could have included both over the counter or prescription medications. ? ?Please review your pharmacy choice. Make sure the pharmacy is open so you can pick up prescription now. If there is a problem, you may contact your provider through [employer] and have the prescription routed to another pharmacy.  Your safety is  important to [name]. If you have drug allergies check your prescription carefully.  ? ?For the next 24 hours you can use MyChart to ask questions about today's visit, request a non-urgent call back, or ask for a work or school excuse. ?You will get an email in the next two days asking about your experience. I  hope that your e-visit has been valuable and will speed your recovery. ? ?Given patient's age, I will treat with anabiotics. Approximately 5 minutes was spent documenting and reviewing patient's chart.  ? ?

## 2021-04-14 ENCOUNTER — Other Ambulatory Visit: Payer: Self-pay | Admitting: Cardiovascular Disease

## 2021-04-20 ENCOUNTER — Ambulatory Visit: Payer: Medicare Other | Admitting: Family Medicine

## 2021-04-25 ENCOUNTER — Encounter: Payer: Self-pay | Admitting: Orthopaedic Surgery

## 2021-04-25 ENCOUNTER — Other Ambulatory Visit: Payer: Self-pay

## 2021-04-25 ENCOUNTER — Ambulatory Visit (INDEPENDENT_AMBULATORY_CARE_PROVIDER_SITE_OTHER): Payer: Medicare Other | Admitting: Family Medicine

## 2021-04-25 ENCOUNTER — Encounter: Payer: Self-pay | Admitting: Family Medicine

## 2021-04-25 ENCOUNTER — Ambulatory Visit: Payer: Medicare Other | Admitting: Orthopaedic Surgery

## 2021-04-25 VITALS — BP 122/60 | HR 70 | Temp 97.2°F | Ht 65.0 in | Wt 163.0 lb

## 2021-04-25 DIAGNOSIS — I5020 Unspecified systolic (congestive) heart failure: Secondary | ICD-10-CM

## 2021-04-25 DIAGNOSIS — R059 Cough, unspecified: Secondary | ICD-10-CM

## 2021-04-25 DIAGNOSIS — M25512 Pain in left shoulder: Secondary | ICD-10-CM | POA: Diagnosis not present

## 2021-04-25 DIAGNOSIS — I2510 Atherosclerotic heart disease of native coronary artery without angina pectoris: Secondary | ICD-10-CM

## 2021-04-25 DIAGNOSIS — R051 Acute cough: Secondary | ICD-10-CM | POA: Diagnosis not present

## 2021-04-25 DIAGNOSIS — E7800 Pure hypercholesterolemia, unspecified: Secondary | ICD-10-CM

## 2021-04-25 DIAGNOSIS — G8929 Other chronic pain: Secondary | ICD-10-CM | POA: Diagnosis not present

## 2021-04-25 DIAGNOSIS — M25511 Pain in right shoulder: Secondary | ICD-10-CM | POA: Diagnosis not present

## 2021-04-25 MED ORDER — METHYLPREDNISOLONE ACETATE 40 MG/ML IJ SUSP
40.0000 mg | INTRAMUSCULAR | Status: AC | PRN
  Administered 2021-04-25: 40 mg via INTRA_ARTICULAR

## 2021-04-25 MED ORDER — LIDOCAINE HCL 1 % IJ SOLN
3.0000 mL | INTRAMUSCULAR | Status: AC | PRN
  Administered 2021-04-25: 3 mL

## 2021-04-25 MED ORDER — BENZONATATE 100 MG PO CAPS: 100.0000 mg | ORAL_CAPSULE | Freq: Three times a day (TID) | ORAL | 0 refills | Status: DC | PRN

## 2021-04-25 NOTE — Progress Notes (Signed)
? ?New Patient Office Visit ? ?Subjective:  ?Patient ID: Beth Johnston, female    DOB: [birth year]  Age: 88 y.o. MRN: [id number] ? ?CC:  ?Chief Complaint  ?Patient presents with  ? Establish Care  ?  NP/establish care, no concerns.   ? ? ?HPI ?Beth Johnston presents for establishment of care.  She is accompanied by her daughter who is also her primary caregiver.  Patient continues to live independently with her daughter's assistance.  She has been doing well.  She exercises twice daily walking laps in her house.  She is followed primarily by cardiology and has a history of CAD, CHF with reduced ejection fraction. ? ?Past Medical History:  ?Diagnosis Date  ? Arthritis   ? "mostly my hands" (12/25/2013)  ? Asthma   ? CAD (coronary artery disease) 01/02/2014  ? S/p NSTEMI in 11/2013 Pt declined CABG PCI:  Rotablator atherectomy and DES x 2 to LAD w Impella support    ? HFrEF (heart failure with reduced ejection fraction) ([place])   ? Ischemic cardiomyopathy  Intol of Spironolactone due to hyper K+ Echo 11/15: EF 10 Echo 5/19: EF 30-35 Echo 7/21: EF 30-35  ? Ischemic cardiomyopathy   ? a. EF 15%  ? LBBB (left bundle branch block) 12/25/2013  ? Mitral regurgitation 12/26/2013  ? Echo 5/19: Mild MR Echo 7/21: Moderate MR  ? Moderate Aortic stenosis 03/15/2021  ? Echocardiogram 06/04/2017: Moderate LVH, EF 30-35, dynamic obstruction with peak gradient 61 mmHg, anteroseptal/inferior/inferoseptal AK, AV mean gradient 12 mmHg, DI 0.33, AV peak velocity 238 cm/s, mild MR Echocardiogram 08/24/2019: EF 30-35, mild LVH, GR 1 DD, normal RVSF, mild LAE, moderate MR, mean AV gradient 10 mmHg, DI 0.37, AV V-max 194 cm/s  ? PONV (postoperative nausea and vomiting)   ? Pure hypercholesterolemia 03/15/2021  ? Upper airway cough syndrome   ? Post-nasal drip  ? ? ?Past Surgical History:  ?Procedure Laterality Date  ? APPENDECTOMY  1960's  ? CARDIAC CATHETERIZATION  12/28/2013  ? Procedure: RIGHT/LEFT HEART CATH AND CORONARY ANGIOGRAPHY;   Surgeon: Troy Sine, MD;  Location: [facility] CATH LAB;  Service: Cardiovascular;;  ? CYSTOCELE REPAIR  12/18/2011  ? Procedure: ANTERIOR REPAIR (CYSTOCELE);  Surgeon: Gus Height, MD;  Location: [place] [facility];  Service: Gynecology;  Laterality: N/A;  ? JOINT REPLACEMENT    ? JOINT REPLACEMENT Right 1980's  ? "ring finger"  ? PERCUTANEOUS CORONARY STENT INTERVENTION (PCI-S) N/A 12/30/2013  ? Procedure: PERCUTANEOUS CORONARY STENT INTERVENTION (PCI-S);  Surgeon: Burnell Blanks, MD;  Location: [facility] CATH LAB;  Service: Cardiovascular;  Laterality: N/A;  ? REPLACEMENT TOTAL KNEE BILATERAL Bilateral 2009  ? TOTAL HIP ARTHROPLASTY Left 10/17/2018  ? Procedure: LEFT TOTAL HIP ARTHROPLASTY ANTERIOR APPROACH;  Surgeon: Mcarthur Rossetti, MD;  Location: [facility];  Service: Orthopedics;  Laterality: Left;  ? ? ?Family History  ?Problem Relation Age of Onset  ? Cerebral aneurysm Mother   ?     died at age 67  ? Healthy Father   ?     died of old age  ? ? ?Social History  ? ?Socioeconomic History  ? Marital status: Widowed  ?  Spouse name: Not on file  ? Number of children: Not on file  ? Years of education: Not on file  ? Highest education level: Not on file  ?Occupational History  ? Not on file  ?Tobacco Use  ? Smoking status: Never  ? Smokeless tobacco: Never  ?Vaping Use  ? Vaping Use:  Never used  ?Substance and Sexual Activity  ? Alcohol use: No  ? Drug use: No  ? Sexual activity: Not on file  ?Other Topics Concern  ? Not on file  ?Social History Narrative  ? Not on file  ? ?Social Determinants of Health  ? ?Financial Resource Strain: Not on file  ?Food Insecurity: Not on file  ?Transportation Needs: Not on file  ?Physical Activity: Not on file  ?Stress: Not on file  ?Social Connections: Not on file  ?Intimate Partner Violence: Not on file  ? ? ?ROS ?Review of Systems  ?Constitutional:  Negative for chills, diaphoresis, fatigue, fever and unexpected weight change.  ?HENT: Negative.    ?Eyes:  Negative for photophobia and  visual disturbance.  ?Respiratory:  Negative for chest tightness, shortness of breath and wheezing.   ?Cardiovascular:  Negative for chest pain and palpitations.  ?Gastrointestinal: Negative.   ?Genitourinary: Negative.   ?Musculoskeletal:  Negative for gait problem.  ?Neurological:  Negative for speech difficulty and weakness.  ? ?  04/25/2021  ?  2:46 PM 04/25/2021  ?  2:16 PM  ?Depression screen PHQ 2/9  ?Decreased Interest 0 0  ?Down, Depressed, Hopeless 0 0  ?PHQ - 2 Score 0 0  ?Altered sleeping 0   ?Tired, decreased energy 0   ?Change in appetite 0   ?Feeling bad or failure about yourself  0   ?Trouble concentrating 0   ?Moving slowly or fidgety/restless 0   ?Suicidal thoughts 0   ?PHQ-9 Score 0   ?Difficult doing work/chores Not difficult at all   ? ? ? ?Objective:  ? ?Today's Vitals: BP 122/60 (BP Location: Right Arm, Patient Position: Sitting, Cuff Size: Normal)   Pulse 70   Temp (!) 97.2 ?F (36.2 ?C) (Temporal)   Ht '5\' 5"'$  (1.651 m)   Wt 163 lb (73.9 kg)   SpO2 95%   BMI 27.12 kg/m?  ? ?Physical Exam ?Vitals and nursing note reviewed.  ?Constitutional:   ?   General: She is not in acute distress. ?   Appearance: Normal appearance. She is normal weight. She is not ill-appearing, toxic-appearing or diaphoretic.  ?HENT:  ?   Head: Normocephalic and atraumatic.  ?   Right Ear: Tympanic membrane, ear canal and external ear normal.  ?   Left Ear: Tympanic membrane, ear canal and external ear normal.  ?   Mouth/Throat:  ?   Mouth: Mucous membranes are moist.  ?   Pharynx: Oropharynx is clear. No oropharyngeal exudate or posterior oropharyngeal erythema.  ?Eyes:  ?   Extraocular Movements: Extraocular movements intact.  ?   Conjunctiva/sclera: Conjunctivae normal.  ?   Pupils: Pupils are equal, round, and reactive to light.  ?Cardiovascular:  ?   Rate and Rhythm: Normal rate and regular rhythm.  ?Pulmonary:  ?   Effort: Pulmonary effort is normal.  ?   Breath sounds: Normal breath sounds.  ?Abdominal:  ?    General: Bowel sounds are normal.  ?Musculoskeletal:  ?   Cervical back: No rigidity or tenderness.  ?   Right lower leg: No edema.  ?   Left lower leg: No edema.  ?Lymphadenopathy:  ?   Cervical: No cervical adenopathy.  ?Skin: ?   General: Skin is warm and dry.  ?Neurological:  ?   Mental Status: She is alert and oriented to person, place, and time.  ? ? ?Assessment & Plan:  ? ?Problem List Items Addressed This Visit   ? ?  ?  Cardiovascular and Mediastinum  ? HFrEF (heart failure with reduced ejection fraction) ([place])  ? Relevant Orders  ? Comprehensive metabolic panel  ? Urinalysis, Routine w reflex microscopic  ? CAD (coronary artery disease)  ? Relevant Orders  ? CBC  ? Comprehensive metabolic panel  ? Lipid panel  ? Urinalysis, Routine w reflex microscopic  ?  ? Other  ? Cough  ? Relevant Medications  ? benzonatate (TESSALON PERLES) 100 MG capsule  ? Pure hypercholesterolemia - Primary  ? Relevant Orders  ? Comprehensive metabolic panel  ? Lipid panel  ? Urinalysis, Routine w reflex microscopic  ? ? ?Outpatient Encounter Medications as of 04/25/2021  ?Medication Sig  ? acetaminophen (TYLENOL) 650 MG CR tablet Take 1,300 mg by mouth 2 (two) times daily.  ? atorvastatin (LIPITOR) 40 MG tablet TAKE ONE-HALF TABLET BY MOUTH  ONCE DAILY AT 6 PM  ? bisacodyl (DULCOLAX) 5 MG EC tablet Take 10 mg by mouth daily.  ? carvedilol (COREG) 6.25 MG tablet TAKE 1 TABLET BY MOUTH TWICE  DAILY WITH MEALS  ? clopidogrel (PLAVIX) 75 MG tablet TAKE 1 TABLET BY MOUTH DAILY  WITH BREAKFAST  ? furosemide (LASIX) 40 MG tablet TAKE 1 TABLET BY MOUTH DAILY  ? losartan (COZAAR) 25 MG tablet TAKE 1 TABLET BY MOUTH DAILY  ? spironolactone (ALDACTONE) 25 MG tablet TAKE ONE-HALF TABLET BY MOUTH  TWICE DAILY AT 8 AM AND 6 PM  ? [DISCONTINUED] benzonatate (TESSALON PERLES) 100 MG capsule Take 1 capsule (100 mg total) by mouth 3 (three) times daily as needed.  ? benzonatate (TESSALON PERLES) 100 MG capsule Take 1 capsule (100 mg total) by mouth 3  (three) times daily as needed.  ? [DISCONTINUED] azithromycin (ZITHROMAX) 250 MG tablet Take 500 mg once, then 250 mg for four days  ? ?No facility-administered encounter medications on file as of 04/25/2021.  ? ? ?Follow

## 2021-04-25 NOTE — Progress Notes (Deleted)
?  ?New Patient Office Visit ?  ?Subjective:  ?Patient ID: Beth Johnston, female    DOB: October 10, 1925  Age: 88 y.o. MRN: [id number] ?  ?CC:  ?    ?Chief Complaint  ?Patient presents with  ? Establish Care  ?    NP/establish care, no concerns.   ?  ?  ?HPI ?Beth Johnston presents for establishment of care.  She is accompanied by her daughter who is her primary caregiver.  Patient is doing well.  With her daughter's assistance she continues to live in a nightly.  She exercises twice daily by walking through her house for at least 20 minutes.  She is followed by cardiology for coronary artery disease and CHF with reduced ejection fraction.  She is nonfasting today. ? ?New Patient Office Visit ? ?Subjective:  ?Patient ID: Beth Johnston, female    DOB: 04/30/1925  Age: 88 y.o. MRN: [id number] ? ?CC:  ?Chief Complaint  ?Patient presents with  ? Establish Care  ?  NP/establish care, no concerns.   ? ? ?HPI ?Beth Johnston presents for [employer] presents for establishment of care.  She is accompanied by her daughter who is her primary caregiver.  Patient is doing well.  With her daughter's assistance she continues to live in a nightly.  She exercises twice daily by walking through her house for at least 20 minutes.  She is followed by cardiology for coronary artery disease and CHF with reduced ejection fraction.  She is nonfasting today. ? ?Past Medical History:  ?Diagnosis Date  ? Arthritis   ? "mostly my hands" (12/25/2013)  ? Asthma   ? CAD (coronary artery disease) 01/02/2014  ? S/p NSTEMI in 11/2013 Pt declined CABG PCI:  Rotablator atherectomy and DES x 2 to LAD w Impella support    ? HFrEF (heart failure with reduced ejection fraction) ([place])   ? Ischemic cardiomyopathy  Intol of Spironolactone due to hyper K+ Echo 11/15: EF 10 Echo 5/19: EF 30-35 Echo 7/21: EF 30-35  ? Ischemic cardiomyopathy   ? a. EF 15%  ? LBBB (left bundle branch block) 12/25/2013  ? Mitral regurgitation 12/26/2013  ? Echo 5/19: Mild MR  Echo 7/21: Moderate MR  ? Moderate Aortic stenosis 03/15/2021  ? Echocardiogram 06/04/2017: Moderate LVH, EF 30-35, dynamic obstruction with peak gradient 61 mmHg, anteroseptal/inferior/inferoseptal AK, AV mean gradient 12 mmHg, DI 0.33, AV peak velocity 238 cm/s, mild MR Echocardiogram 08/24/2019: EF 30-35, mild LVH, GR 1 DD, normal RVSF, mild LAE, moderate MR, mean AV gradient 10 mmHg, DI 0.37, AV V-max 194 cm/s  ? PONV (postoperative nausea and vomiting)   ? Pure hypercholesterolemia 03/15/2021  ? Upper airway cough syndrome   ? Post-nasal drip  ? ? ?Past Surgical History:  ?Procedure Laterality Date  ? APPENDECTOMY  1960's  ? CARDIAC CATHETERIZATION  12/28/2013  ? Procedure: RIGHT/LEFT HEART CATH AND CORONARY ANGIOGRAPHY;  Surgeon: Troy Sine, MD;  Location: [facility] CATH LAB;  Service: Cardiovascular;;  ? CYSTOCELE REPAIR  12/18/2011  ? Procedure: ANTERIOR REPAIR (CYSTOCELE);  Surgeon: Gus Height, MD;  Location: [place] [facility];  Service: Gynecology;  Laterality: N/A;  ? JOINT REPLACEMENT    ? JOINT REPLACEMENT Right 1980's  ? "ring finger"  ? PERCUTANEOUS CORONARY STENT INTERVENTION (PCI-S) N/A 12/30/2013  ? Procedure: PERCUTANEOUS CORONARY STENT INTERVENTION (PCI-S);  Surgeon: Burnell Blanks, MD;  Location: [facility] CATH LAB;  Service: Cardiovascular;  Laterality: N/A;  ? REPLACEMENT TOTAL KNEE BILATERAL Bilateral 2009  ?  TOTAL HIP ARTHROPLASTY Left 10/17/2018  ? Procedure: LEFT TOTAL HIP ARTHROPLASTY ANTERIOR APPROACH;  Surgeon: Mcarthur Rossetti, MD;  Location: [facility];  Service: Orthopedics;  Laterality: Left;  ? ? ?Family History  ?Problem Relation Age of Onset  ? Cerebral aneurysm Mother   ?     died at age 54  ? Healthy Father   ?     died of old age  ? ? ?Social History  ? ?Socioeconomic History  ? Marital status: Widowed  ?  Spouse name: Not on file  ? Number of children: Not on file  ? Years of education: Not on file  ? Highest education level: Not on file  ?Occupational History  ? Not on file  ?Tobacco Use  ?  Smoking status: Never  ? Smokeless tobacco: Never  ?Vaping Use  ? Vaping Use: Never used  ?Substance and Sexual Activity  ? Alcohol use: No  ? Drug use: No  ? Sexual activity: Not on file  ?Other Topics Concern  ? Not on file  ?Social History Narrative  ? Not on file  ? ?Social Determinants of Health  ? ?Financial Resource Strain: Not on file  ?Food Insecurity: Not on file  ?Transportation Needs: Not on file  ?Physical Activity: Not on file  ?Stress: Not on file  ?Social Connections: Not on file  ?Intimate Partner Violence: Not on file  ? ? ?ROS ?Review of Systems  ?Constitutional: Negative.   ?HENT: Negative.    ?Eyes:  Negative for photophobia and visual disturbance.  ?Respiratory: Negative.    ?Cardiovascular: Negative.   ?Gastrointestinal: Negative.   ?Endocrine: Negative for polyphagia and polyuria.  ?Genitourinary: Negative.   ?Musculoskeletal:  Positive for arthralgias.  ?Neurological:  Negative for speech difficulty, weakness and light-headedness.  ? ?  04/25/2021  ?  2:46 PM 04/25/2021  ?  2:16 PM  ?Depression screen PHQ 2/9  ?Decreased Interest 0 0  ?Down, Depressed, Hopeless 0 0  ?PHQ - 2 Score 0 0  ?Altered sleeping 0   ?Tired, decreased energy 0   ?Change in appetite 0   ?Feeling bad or failure about yourself  0   ?Trouble concentrating 0   ?Moving slowly or fidgety/restless 0   ?Suicidal thoughts 0   ?PHQ-9 Score 0   ?Difficult doing work/chores Not difficult at all   ? ? ? ?Objective:  ? ?Today's Vitals: BP 122/60 (BP Location: Right Arm, Patient Position: Sitting, Cuff Size: Normal)   Pulse 70   Temp (!) 97.2 ?F (36.2 ?C) (Temporal)   Ht '5\' 5"'$  (1.651 m)   Wt 163 lb (73.9 kg)   SpO2 95%   BMI 27.12 kg/m?  ? ?Physical Exam ?Vitals and nursing note reviewed.  ?Constitutional:   ?   General: She is not in acute distress. ?   Appearance: Normal appearance. She is normal weight. She is not ill-appearing, toxic-appearing or diaphoretic.  ?HENT:  ?   Head: Normocephalic.  ?   Right Ear: Tympanic  membrane, ear canal and external ear normal.  ?   Left Ear: Tympanic membrane, ear canal and external ear normal.  ?   Mouth/Throat:  ?   Mouth: Mucous membranes are moist.  ?   Pharynx: Oropharynx is clear. No oropharyngeal exudate or posterior oropharyngeal erythema.  ?Eyes:  ?   General: No scleral icterus.    ?   Right eye: No discharge.     ?   Left eye: No discharge.  ?   Extraocular  Movements: Extraocular movements intact.  ?   Conjunctiva/sclera: Conjunctivae normal.  ?   Pupils: Pupils are equal, round, and reactive to light.  ?Cardiovascular:  ?   Rate and Rhythm: Normal rate and regular rhythm.  ?   Heart sounds: Murmur heard.  ?Pulmonary:  ?   Effort: Pulmonary effort is normal.  ?   Breath sounds: Normal breath sounds. No rhonchi.  ?Abdominal:  ?   General: Bowel sounds are normal.  ?Musculoskeletal:  ?   Cervical back: No rigidity or tenderness.  ?   Right lower leg: No edema.  ?   Left lower leg: No edema.  ?Lymphadenopathy:  ?   Cervical: No cervical adenopathy.  ?Skin: ?   General: Skin is warm and dry.  ?Neurological:  ?   Mental Status: She is alert and oriented to person, place, and time.  ? ? ?Assessment & Plan:  ? ?Problem List Items Addressed This Visit   ? ?  ? Cardiovascular and Mediastinum  ? HFrEF (heart failure with reduced ejection fraction) ([place])  ? Relevant Orders  ? Comprehensive metabolic panel  ? Urinalysis, Routine w reflex microscopic  ? CAD (coronary artery disease)  ? Relevant Orders  ? CBC  ? Comprehensive metabolic panel  ? Lipid panel  ? Urinalysis, Routine w reflex microscopic  ?  ? Other  ? Cough  ? Relevant Medications  ? benzonatate (TESSALON PERLES) 100 MG capsule  ? Pure hypercholesterolemia - Primary  ? Relevant Orders  ? Comprehensive metabolic panel  ? Lipid panel  ? Urinalysis, Routine w reflex microscopic  ? ? ?Outpatient Encounter Medications as of 04/25/2021  ?Medication Sig  ? acetaminophen (TYLENOL) 650 MG CR tablet Take 1,300 mg by mouth 2 (two) times daily.  ?  atorvastatin (LIPITOR) 40 MG tablet TAKE ONE-HALF TABLET BY MOUTH  ONCE DAILY AT 6 PM  ? bisacodyl (DULCOLAX) 5 MG EC tablet Take 10 mg by mouth daily.  ? carvedilol (COREG) 6.25 MG tablet TAKE 1 TABLET BY MOUTH T

## 2021-04-25 NOTE — Progress Notes (Signed)
? ?  Procedure Note ? ?Patient: Beth Johnston             ?Date of Birth: Jan 02, 1926           ?MRN: [id number]             ?Visit Date: 04/25/2021 ? ?HPI: [name] is well-known to Dr. Trevor Mace service comes in today for injections of both shoulders.  She chronic bilateral shoulder pain.  She was seen last December and underwent injection of her shoulder states was beneficial.  She denies any changes to her medical status or new injuries to either shoulder.  Right shoulder is more painful than the left.  Denies any numbness tingling down the arm. ? ?Physical exam: ?General: No acute distress alert and oriented x3. ?Bilateral shoulder she has very limited motion of both shoulders secondary to pain.  There is significant crepitus with any attempts of range of motion of either shoulder. ? ?Procedures: ?Visit Diagnoses:  ?1. Chronic pain of both shoulders   ? ? ?Large Joint Inj: bilateral subacromial bursa on 04/25/2021 8:52 AM ?Indications: pain ?Details: 22 G 1.5 in needle, superior approach ? ?Arthrogram: No ? ?Medications (Right): 3 mL lidocaine 1 %; 40 mg methylPREDNISolone acetate 40 MG/ML ?Medications (Left): 3 mL lidocaine 1 %; 40 mg methylPREDNISolone acetate 40 MG/ML ?Outcome: tolerated well, no immediate complications ?Procedure, treatment alternatives, risks and benefits explained, specific risks discussed. Consent was given by the patient. Immediately prior to procedure a time out was called to verify the correct patient, procedure, equipment, support staff and site/side marked as required. Patient was prepped and draped in the usual sterile fashion.  ? ?Plan: She knows to wait at least 3 months between injections.  She tolerated the injections well today.  Questions were encouraged and answered by Dr. Ninfa Linden and myself. ? ? ? ?

## 2021-04-28 ENCOUNTER — Other Ambulatory Visit: Payer: Medicare Other

## 2021-05-02 ENCOUNTER — Other Ambulatory Visit (INDEPENDENT_AMBULATORY_CARE_PROVIDER_SITE_OTHER): Payer: Medicare Other

## 2021-05-02 DIAGNOSIS — I2510 Atherosclerotic heart disease of native coronary artery without angina pectoris: Secondary | ICD-10-CM

## 2021-05-02 DIAGNOSIS — E7800 Pure hypercholesterolemia, unspecified: Secondary | ICD-10-CM | POA: Diagnosis not present

## 2021-05-02 DIAGNOSIS — I5020 Unspecified systolic (congestive) heart failure: Secondary | ICD-10-CM

## 2021-05-02 LAB — LIPID PANEL
Cholesterol: 124 mg/dL (ref 0–200)
HDL: 62.3 mg/dL (ref >39.00)
LDL Cholesterol: 50 mg/dL (ref 0–99)
NonHDL: 61.69
Total CHOL/HDL Ratio: 2
Triglycerides: 58 mg/dL (ref 0.0–149.0)
VLDL: 11.6 mg/dL (ref 0.0–40.0)

## 2021-05-02 LAB — COMPREHENSIVE METABOLIC PANEL
ALT: 18 U/L (ref 0–35)
AST: 21 U/L (ref 0–37)
Albumin: 4.3 g/dL (ref 3.5–5.2)
Alkaline Phosphatase: 78 U/L (ref 39–117)
BUN: 38 mg/dL — ABNORMAL HIGH (ref 6–23)
CO2: 24 mEq/L (ref 19–32)
Calcium: 9.5 mg/dL (ref 8.4–10.5)
Chloride: 107 mEq/L (ref 96–112)
Creatinine, Ser: 1.29 mg/dL — ABNORMAL HIGH (ref 0.40–1.20)
GFR: 35.82 mL/min — ABNORMAL LOW (ref >60.00)
Glucose, Bld: 103 mg/dL — ABNORMAL HIGH (ref 70–99)
Potassium: 4.6 mEq/L (ref 3.5–5.1)
Sodium: 141 mEq/L (ref 135–145)
Total Bilirubin: 1 mg/dL (ref 0.2–1.2)
Total Protein: 6.6 g/dL (ref 6.0–8.3)

## 2021-05-02 LAB — CBC
HCT: 33.8 % — ABNORMAL LOW (ref 36.0–46.0)
Hemoglobin: 10.8 g/dL — ABNORMAL LOW (ref 12.0–15.0)
MCHC: 32 g/dL (ref 30.0–36.0)
MCV: 85.1 fl (ref 78.0–100.0)
Platelets: 216 10*3/uL (ref 150.0–400.0)
RBC: 3.97 Mil/uL (ref 3.87–5.11)
RDW: 13.6 % (ref 11.5–15.5)
WBC: 5.7 10*3/uL (ref 4.0–10.5)

## 2021-05-02 LAB — URINALYSIS, ROUTINE W REFLEX MICROSCOPIC
Bilirubin Urine: NEGATIVE
Hgb urine dipstick: NEGATIVE
Ketones, ur: NEGATIVE
Nitrite: NEGATIVE
Specific Gravity, Urine: 1.01 (ref 1.000–1.030)
Total Protein, Urine: NEGATIVE
Urine Glucose: NEGATIVE
Urobilinogen, UA: 0.2 (ref 0.0–1.0)
pH: 5.5 (ref 5.0–8.0)

## 2021-05-16 ENCOUNTER — Encounter: Payer: Self-pay | Admitting: Family Medicine

## 2021-05-16 ENCOUNTER — Telehealth (INDEPENDENT_AMBULATORY_CARE_PROVIDER_SITE_OTHER): Payer: Medicare Other | Admitting: Family Medicine

## 2021-05-16 VITALS — Ht 65.0 in

## 2021-05-16 DIAGNOSIS — I5020 Unspecified systolic (congestive) heart failure: Secondary | ICD-10-CM

## 2021-05-16 DIAGNOSIS — N1832 Chronic kidney disease, stage 3b: Secondary | ICD-10-CM

## 2021-05-16 MED ORDER — EMPAGLIFLOZIN 10 MG PO TABS: 10.0000 mg | ORAL_TABLET | Freq: Every day | ORAL | 2 refills | Status: DC

## 2021-05-16 NOTE — Progress Notes (Signed)
? ?  Established Patient Office Visit ? ?Subjective   ?Patient ID: Beth Johnston, female    DOB: [birth year]  Age: 88 y.o. MRN: [id number] ? ?Chief Complaint  ?Patient presents with  ? Advice Only  ?  Discuss labs.   ? ? ?HPI follow-up of her declining kidney function.  Also has a history H-F R EF.  Recent echo showed an EF of 30 to 35%.  Daughter was present for visit. ? ? ? ? ?Review of Systems  ?Constitutional: Negative.   ?Respiratory: Negative.    ?Cardiovascular: Negative.   ?Genitourinary: Negative.   ? ?  ?Objective:  ?  ? ?Ht '5\' 5"'$  (1.651 m)   BMI 27.12 kg/m?  ? ? ?Physical Exam ?Vitals and nursing note reviewed.  ?Pulmonary:  ?   Effort: Pulmonary effort is normal.  ?Neurological:  ?   Mental Status: She is alert. Mental status is at baseline.  ?Psychiatric:     ?   Mood and Affect: Mood normal.     ?   Behavior: Behavior normal.  ? ? ? ?No results found for any visits on 05/16/21. ? ? ? ?The ASCVD Risk score (Arnett DK, et al., 2019) failed to calculate for the following reasons: ?  The 2019 ASCVD risk score is only valid for ages 17 to 46 ?  The patient has a prior MI or stroke diagnosis ? ?  ?Assessment & Plan:  ? ?Problem List Items Addressed This Visit   ? ?  ? Cardiovascular and Mediastinum  ? HFrEF (heart failure with reduced ejection fraction) ([place])  ? Relevant Medications  ? empagliflozin (JARDIANCE) 10 MG TABS tablet  ? Other Relevant Orders  ? Basic metabolic panel  ?  ? Genitourinary  ? Stage 3b chronic kidney disease ([place]) - Primary  ? Relevant Medications  ? empagliflozin (JARDIANCE) 10 MG TABS tablet  ? Other Relevant Orders  ? Basic metabolic panel  ? ? ?Return in about 3 months (around 08/15/2021).  ?Patient has history of HF R EF and declining renal function.  She is not a diabetic.  We will add empagliflozin.  Advised patient's daughter to look out for lightheadedness, dizziness increased frequency of urination.  If there is any other problems with the medication let me know. ? ?Libby Maw, MD ?Virtual Visit via Telephone Note ? ?I connected with Beth Johnston on 05/16/21 at  2:20 PM EDT by telephone and verified that I am speaking with the correct person using two identifiers. ? ?Location: ?Patient: home with her daughter.  ?Provider: work ?  ?I discussed the limitations, risks, security and privacy concerns of performing an evaluation and management service by telephone and the availability of in person appointments. I also discussed with the patient that there may be a patient responsible charge related to this service. The patient expressed understanding and agreed to proceed. ? ? ?History of Present Illness: ? ?  ?Observations/Objective: ? ? ?Assessment and Plan: ? ? ?Follow Up Instructions: ? ?  ?I discussed the assessment and treatment plan with the patient. The patient was provided an opportunity to ask questions and all were answered. The patient agreed with the plan and demonstrated an understanding of the instructions. ?  ?The patient was advised to call back or seek an in-person evaluation if the symptoms worsen or if the condition fails to improve as anticipated. ? ?I provided 20 minutes of non-face-to-face time during this encounter. ? ? ?Libby Maw, MD  ?

## 2021-05-30 ENCOUNTER — Encounter: Payer: Self-pay | Admitting: Family Medicine

## 2021-05-30 DIAGNOSIS — L814 Other melanin hyperpigmentation: Secondary | ICD-10-CM | POA: Diagnosis not present

## 2021-05-30 DIAGNOSIS — Z85828 Personal history of other malignant neoplasm of skin: Secondary | ICD-10-CM | POA: Diagnosis not present

## 2021-05-30 DIAGNOSIS — D225 Melanocytic nevi of trunk: Secondary | ICD-10-CM | POA: Diagnosis not present

## 2021-05-30 DIAGNOSIS — L821 Other seborrheic keratosis: Secondary | ICD-10-CM | POA: Diagnosis not present

## 2021-05-31 ENCOUNTER — Encounter: Payer: Self-pay | Admitting: Family Medicine

## 2021-06-20 ENCOUNTER — Ambulatory Visit (INDEPENDENT_AMBULATORY_CARE_PROVIDER_SITE_OTHER): Payer: Medicare Other

## 2021-06-20 DIAGNOSIS — Z0000 Encounter for general adult medical examination without abnormal findings: Secondary | ICD-10-CM | POA: Diagnosis not present

## 2021-06-20 NOTE — Progress Notes (Signed)
Subjective:   Beth Johnston is a 88 y.o. female who presents for Medicare Annual (Subsequent) preventive examination.   I connected with Beth Johnston  today by telephone and verified that I am speaking with the correct person using two identifiers. Location patient: home Location provider: work Persons participating in the virtual visit: patient, provider.   I discussed the limitations, risks, security and privacy concerns of performing an evaluation and management service by telephone and the availability of in person appointments. I also discussed with the patient that there may be a patient responsible charge related to this service. The patient expressed understanding and verbally consented to this telephonic visit.    Interactive audio and video telecommunications were attempted between this provider and patient, however failed, due to patient having technical difficulties OR patient did not have access to video capability.  We continued and completed visit with audio only.    Review of Systems     Cardiac Risk Factors include: advanced age (>35mn, >>58women)     Objective:    Today's Vitals   There is no height or weight on file to calculate BMI.     06/20/2021   10:40 AM 10/17/2018    3:00 PM 09/23/2018    2:28 PM 11/28/2015    1:40 PM 12/25/2013    2:04 PM 12/25/2013    5:55 AM 12/18/2011    9:15 AM  Advanced Directives  Does Patient Have a Medical Advance Directive? Yes Yes Yes Yes Yes No Patient has advance directive, copy in chart  Type of Advance Directive [place] will [place]  Does patient want to make changes to medical advance directive?  No - Patient declined No - Patient declined  No - Patient declined    Copy of [place] Chart? No - copy requested Yes - validated most recent copy scanned in  chart (See row information) Yes - validated most recent copy scanned in chart (See row information)  No - copy requested    Pre-existing out of facility DNR order (yellow form or pink MOST form)       No    Current Medications (verified) Outpatient Encounter Medications as of 06/20/2021  Medication Sig   acetaminophen (TYLENOL) 650 MG CR tablet Take 1,300 mg by mouth 2 (two) times daily.   atorvastatin (LIPITOR) 40 MG tablet TAKE ONE-HALF TABLET BY MOUTH  ONCE DAILY AT 6 PM   benzonatate (TESSALON PERLES) 100 MG capsule Take 1 capsule (100 mg total) by mouth 3 (three) times daily as needed.   bisacodyl (DULCOLAX) 5 MG EC tablet Take 10 mg by mouth daily.   carvedilol (COREG) 6.25 MG tablet TAKE 1 TABLET BY MOUTH TWICE  DAILY WITH MEALS   clopidogrel (PLAVIX) 75 MG tablet TAKE 1 TABLET BY MOUTH DAILY  WITH BREAKFAST   furosemide (LASIX) 40 MG tablet TAKE 1 TABLET BY MOUTH DAILY   losartan (COZAAR) 25 MG tablet TAKE 1 TABLET BY MOUTH DAILY   spironolactone (ALDACTONE) 25 MG tablet TAKE ONE-HALF TABLET BY MOUTH  TWICE DAILY AT 8 AM AND 6 PM   empagliflozin (JARDIANCE) 10 MG TABS tablet Take 1 tablet (10 mg total) by mouth daily before breakfast.   No facility-administered encounter medications on file as of 06/20/2021.    Allergies (verified) Codeine, Lisinopril, and Tramadol   History: Past Medical History:  Diagnosis Date  Arthritis    "mostly my hands" (12/25/2013)   Asthma    CAD (coronary artery disease) 01/02/2014   S/p NSTEMI in 11/2013 Pt declined CABG PCI:  Rotablator atherectomy and DES x 2 to LAD w Impella support     HFrEF (heart failure with reduced ejection fraction) ([place])    Ischemic cardiomyopathy  Intol of Spironolactone due to hyper K+ Echo 11/15: EF 10 Echo 5/19: EF 30-35 Echo 7/21: EF 30-35   Ischemic cardiomyopathy    a. EF 15%   LBBB (left bundle branch block) 12/25/2013   Mitral regurgitation 12/26/2013   Echo 5/19: Mild MR Echo 7/21: Moderate MR   Moderate  Aortic stenosis 03/15/2021   Echocardiogram 06/04/2017: Moderate LVH, EF 30-35, dynamic obstruction with peak gradient 61 mmHg, anteroseptal/inferior/inferoseptal AK, AV mean gradient 12 mmHg, DI 0.33, AV peak velocity 238 cm/s, mild MR Echocardiogram 08/24/2019: EF 30-35, mild LVH, GR 1 DD, normal RVSF, mild LAE, moderate MR, mean AV gradient 10 mmHg, DI 0.37, AV V-max 194 cm/s   PONV (postoperative nausea and vomiting)    Pure hypercholesterolemia 03/15/2021   Upper airway cough syndrome    Post-nasal drip   Past Surgical History:  Procedure Laterality Date   APPENDECTOMY  1960's   CARDIAC CATHETERIZATION  12/28/2013   Procedure: RIGHT/LEFT HEART CATH AND CORONARY ANGIOGRAPHY;  Surgeon: Troy Sine, MD;  Location[facility];  Service: Cardiovascular;;   CYSTOCELE REPAIR  12/18/2011   Procedure: ANTERIOR REPAIR (CYSTOCELE);  Surgeon: Gus Height, MD;  Location: [facility];  Service: Gynecology;  Laterality: N/A;   JOINT REPLACEMENT     JOINT REPLACEMENT Right 1980's   "ring finger"   PERCUTANEOUS CORONARY STENT INTERVENTION (PCI-S) N/A 12/30/2013   Procedure: PERCUTANEOUS CORONARY STENT INTERVENTION (PCI-S);  Surgeon: Burnell Blanks, MD;  Location: [facility] CATH LAB;  Service: Cardiovascular;  Laterality: N/A;   REPLACEMENT TOTAL KNEE BILATERAL Bilateral 2009   TOTAL HIP ARTHROPLASTY Left 10/17/2018   Procedure: LEFT TOTAL HIP ARTHROPLASTY ANTERIOR APPROACH;  Surgeon: Mcarthur Rossetti, MD;  Location: [facility];  Service: Orthopedics;  Laterality: Left;   Family History  Problem Relation Age of Onset   Cerebral aneurysm Mother        died at age 17   Healthy Father        died of old age   Social History   Socioeconomic History   Marital status: Widowed    Spouse name: Not on file   Number of children: Not on file   Years of education: Not on file   Highest education level: Not on file  Occupational History   Not on file  Tobacco Use   Smoking status: Never   Smokeless  tobacco: Never  Vaping Use   Vaping Use: Never used  Substance and Sexual Activity   Alcohol use: No   Drug use: No   Sexual activity: Not on file  Other Topics Concern   Not on file  Social History Narrative   Not on file   Social Determinants of Health   Financial Resource Strain: Low Risk    Difficulty of Paying Living Expenses: Not hard at all  Food Insecurity: No Food Insecurity   Worried About Running Out of Food in the Last Year: Never true   [place] in the Last Year: Never true  Transportation Needs: No Transportation Needs   Lack of Transportation (Medical): No   Lack of Transportation (Non-Medical): No  Physical Activity: Insufficiently Active  Days of Exercise per Week: 3 days   Minutes of Exercise per Session: 30 min  Stress: No Stress Concern Present   Feeling of Stress : Not at all  Social Connections: Moderately Isolated   Frequency of Communication with Friends and Family: Three times a week   Frequency of Social Gatherings with Friends and Family: Three times a week   Attends Religious Services: More than 4 times per year   Active Member of Clubs or Organizations: No   Attends Archivist Meetings: Never   Marital Status: Widowed    Tobacco Counseling Counseling given: Not Answered   Clinical Intake:  Pre-visit preparation completed: Yes  Pain : No/denies pain     Nutritional Risks: None Diabetes: No  How often do you need to have someone help you when you read instructions, pamphlets, or other written materials from your doctor or pharmacy?: 1 - Never What is the last grade level you completed in school?: High School  Diabetic?no   Interpreter Needed?: No  Information entered by :: L.Miniya Miguez,LPN   Activities of Daily Living    06/20/2021   10:43 AM  In your present state of health, do you have any difficulty performing the following activities:  Hearing? 0  Vision? 0  Difficulty concentrating or making decisions? 0   Walking or climbing stairs? 0  Dressing or bathing? 0  Doing errands, shopping? 0  Preparing Food and eating ? N  Using the Toilet? N  In the past six months, have you accidently leaked urine? N  Do you have problems with loss of bowel control? N  Managing your Medications? N  Managing your Finances? N  Housekeeping or managing your Housekeeping? N    Patient Care Team: Libby Maw, MD as PCP - General (Family Medicine) Burnell Blanks, MD as PCP - Cardiology (Cardiology)  Indicate any recent Medical Services you may have received from other than [facility] providers in the past year (date may be approximate).     Assessment:   This is a routine wellness examination for Deva.  Hearing/Vision screen Vision Screening - Comments:: Annual eye exams wear glasses   Dietary issues and exercise activities discussed: Current Exercise Habits: Home exercise routine, Type of exercise: walking, Time (Minutes): 30, Frequency (Times/Week): 3, Weekly Exercise (Minutes/Week): 90, Intensity: Mild, Exercise limited by: None identified   Goals Addressed   None    Depression Screen    06/20/2021   10:41 AM 06/20/2021   10:39 AM 05/16/2021    2:29 PM 04/25/2021    2:46 PM 04/25/2021    2:16 PM  PHQ 2/9 Scores  PHQ - 2 Score 0 0 0 0 0  PHQ- 9 Score    0     Fall Risk    06/20/2021   10:41 AM 05/16/2021    2:29 PM 04/25/2021    2:16 PM  [place] in the past year? 0 0 0  Number falls in past yr: 0 0 0  Injury with Fall? 0    Follow up Falls evaluation completed      [place]:  Any stairs in or around the home? Yes  If so, are there any without handrails? No  Home free of loose throw rugs in walkways, pet beds, electrical cords, etc? Yes  Adequate lighting in your home to reduce risk of falls? Yes   ASSISTIVE DEVICES UTILIZED TO PREVENT FALLS:  Life alert? No  Use  of a cane, walker or w/c? No  Grab bars in the bathroom?  Yes  Shower chair or bench in shower? Yes  Elevated toilet seat or a handicapped toilet? Yes     Cognitive Function:    Normal cognitive status assessed by telephone conversation  by this Nurse Health Advisor. No abnormalities found.      Immunizations  There is no immunization history on file for this patient.  TDAP status: Due, Education has been provided regarding the importance of this vaccine. Advised may receive this vaccine at local pharmacy or Health Dept. Aware to provide a copy of the vaccination record if obtained from local pharmacy or Health Dept. Verbalized acceptance and understanding.  Flu Vaccine status: Due, Education has been provided regarding the importance of this vaccine. Advised may receive this vaccine at local pharmacy or Health Dept. Aware to provide a copy of the vaccination record if obtained from local pharmacy or Health Dept. Verbalized acceptance and understanding.  Pneumococcal vaccine status: Due, Education has been provided regarding the importance of this vaccine. Advised may receive this vaccine at local pharmacy or Health Dept. Aware to provide a copy of the vaccination record if obtained from local pharmacy or Health Dept. Verbalized acceptance and understanding.  Covid-19 vaccine status: Completed vaccines  Qualifies for Shingles Vaccine? Yes   Zostavax completed No   Shingrix Completed?: No.    Education has been provided regarding the importance of this vaccine. Patient has been advised to call insurance company to determine out of pocket expense if they have not yet received this vaccine. Advised may also receive vaccine at local pharmacy or Health Dept. Verbalized acceptance and understanding.  Screening Tests Health Maintenance  Topic Date Due   HPV VACCINES  Aged Out   Pneumonia Vaccine 71+ Years old  Discontinued   INFLUENZA VACCINE  Discontinued   DEXA SCAN  Discontinued   TETANUS/TDAP  Discontinued   COVID-19 Vaccine  Discontinued    Zoster Vaccines- Shingrix  Discontinued    Health Maintenance  There are no preventive care reminders to display for this patient.  Colorectal cancer screening: No longer required.   Mammogram status: No longer required due to age .  Bone Density status: Ordered declined . Pt provided with contact info and advised to call to schedule appt.  Lung Cancer Screening: (Low Dose CT Chest recommended if Age 21-80 years, 30 pack-year currently smoking OR have quit w/in 15years.) does not qualify.   Lung Cancer Screening Referral: n/a  Additional Screening:  Hepatitis C Screening: does not qualify;  Vision Screening: Recommended annual ophthalmology exams for early detection of glaucoma and other disorders of the eye. Is the patient up to date with their annual eye exam?  Yes  Who is the provider or what is the name of the office in which the patient attends annual eye exams? Dr.Snipes  If pt is not established with a provider, would they like to be referred to a provider to establish care? No .   Dental Screening: Recommended annual dental exams for proper oral hygiene  Community Resource Referral / Chronic Care Management: CRR required this visit?  No   CCM required this visit?  No      Plan:     I have personally reviewed and noted the following in the patient's chart:   Medical and social history Use of alcohol, tobacco or illicit drugs  Current medications and supplements including opioid prescriptions.  Functional ability and status Nutritional status Physical activity  Advanced directives List of other physicians Hospitalizations, surgeries, and ER visits in previous 12 months Vitals Screenings to include cognitive, depression, and falls Referrals and appointments  In addition, I have reviewed and discussed with patient certain preventive protocols, quality metrics, and best practice recommendations. A written personalized care plan for preventive services as well  as general preventive health recommendations were provided to patient.     Randel Pigg, LPN   [id number]   Nurse Notes: none

## 2021-06-20 NOTE — Patient Instructions (Signed)
[name] , Thank you for taking time to come for your Medicare Wellness Visit. I appreciate your ongoing commitment to your health goals. Please review the following plan we discussed and let me know if I can assist you in the future.   Screening recommendations/referrals: Colonoscopy: no longer required  Mammogram: no longer required  Bone Density: declined  Recommended yearly ophthalmology/optometry visit for glaucoma screening and checkup Recommended yearly dental visit for hygiene and checkup  Vaccinations: Influenza vaccine: declined  Pneumococcal vaccine: declined  Tdap vaccine: declined  Shingles vaccine: declined     Advanced directives: yes   Conditions/risks identified: none   Next appointment: none   Preventive Care 44 Years and Older, Female Preventive care refers to lifestyle choices and visits with your health care provider that can promote health and wellness. What does preventive care include? A yearly physical exam. This is also called an annual well check. Dental exams once or twice a year. Routine eye exams. Ask your health care provider how often you should have your eyes checked. Personal lifestyle choices, including: Daily care of your teeth and gums. Regular physical activity. Eating a healthy diet. Avoiding tobacco and drug use. Limiting alcohol use. Practicing safe sex. Taking low-dose aspirin every day. Taking vitamin and mineral supplements as recommended by your health care provider. What happens during an annual well check? The services and screenings done by your health care provider during your annual well check will depend on your age, overall health, lifestyle risk factors, and family history of disease. Counseling  Your health care provider may ask you questions about your: Alcohol use. Tobacco use. Drug use. Emotional well-being. Home and relationship well-being. Sexual activity. Eating habits. History of falls. Memory and ability  to understand (cognition). Work and work Statistician. Reproductive health. Screening  You may have the following tests or measurements: Height, weight, and BMI. Blood pressure. Lipid and cholesterol levels. These may be checked every 5 years, or more frequently if you are over 73 years old. Skin check. Lung cancer screening. You may have this screening every year starting at age 21 if you have a 30-pack-year history of smoking and currently smoke or have quit within the past 15 years. Fecal occult blood test (FOBT) of the stool. You may have this test every year starting at age 52. Flexible sigmoidoscopy or colonoscopy. You may have a sigmoidoscopy every 5 years or a colonoscopy every 10 years starting at age 12. Hepatitis C blood test. Hepatitis B blood test. Sexually transmitted disease (STD) testing. Diabetes screening. This is done by checking your blood sugar (glucose) after you have not eaten for a while (fasting). You may have this done every 1-3 years. Bone density scan. This is done to screen for osteoporosis. You may have this done starting at age 15. Mammogram. This may be done every 1-2 years. Talk to your health care provider about how often you should have regular mammograms. Talk with your health care provider about your test results, treatment options, and if necessary, the need for more tests. Vaccines  Your health care provider may recommend certain vaccines, such as: Influenza vaccine. This is recommended every year. Tetanus, diphtheria, and acellular pertussis (Tdap, Td) vaccine. You may need a Td booster every 10 years. Zoster vaccine. You may need this after age 64. Pneumococcal 13-valent conjugate (PCV13) vaccine. One dose is recommended after age 77. Pneumococcal polysaccharide (PPSV23) vaccine. One dose is recommended after age 71. Talk to your health care provider about which screenings and  vaccines you need and how often you need them. This information is not  intended to replace advice given to you by your health care provider. Make sure you discuss any questions you have with your health care provider. Document Released: 02/11/2015 Document Revised: 10/05/2015 Document Reviewed: 11/16/2014 Elsevier Interactive Patient Education  2017 [place] Prevention in the Home Falls can cause injuries. They can happen to people of all ages. There are many things you can do to make your home safe and to help prevent falls. What can I do on the outside of my home? Regularly fix the edges of walkways and driveways and fix any cracks. Remove anything that might make you trip as you walk through a door, such as a raised step or threshold. Trim any bushes or trees on the path to your home. Use bright outdoor lighting. Clear any walking paths of anything that might make someone trip, such as rocks or tools. Regularly check to see if handrails are loose or broken. Make sure that both sides of any steps have handrails. Any raised decks and porches should have guardrails on the edges. Have any leaves, snow, or ice cleared regularly. Use sand or salt on walking paths during winter. Clean up any spills in your garage right away. This includes oil or grease spills. What can I do in the bathroom? Use night lights. Install grab bars by the toilet and in the tub and shower. Do not use towel bars as grab bars. Use non-skid mats or decals in the tub or shower. If you need to sit down in the shower, use a plastic, non-slip stool. Keep the floor dry. Clean up any water that spills on the floor as soon as it happens. Remove soap buildup in the tub or shower regularly. Attach bath mats securely with double-sided non-slip rug tape. Do not have throw rugs and other things on the floor that can make you trip. What can I do in the bedroom? Use night lights. Make sure that you have a light by your bed that is easy to reach. Do not use any sheets or blankets that are  too big for your bed. They should not hang down onto the floor. Have a firm chair that has side arms. You can use this for support while you get dressed. Do not have throw rugs and other things on the floor that can make you trip. What can I do in the kitchen? Clean up any spills right away. Avoid walking on wet floors. Keep items that you use a lot in easy-to-reach places. If you need to reach something above you, use a strong step stool that has a grab bar. Keep electrical cords out of the way. Do not use floor polish or wax that makes floors slippery. If you must use wax, use non-skid floor wax. Do not have throw rugs and other things on the floor that can make you trip. What can I do with my stairs? Do not leave any items on the stairs. Make sure that there are handrails on both sides of the stairs and use them. Fix handrails that are broken or loose. Make sure that handrails are as long as the stairways. Check any carpeting to make sure that it is firmly attached to the stairs. Fix any carpet that is loose or worn. Avoid having throw rugs at the top or bottom of the stairs. If you do have throw rugs, attach them to the floor with carpet tape. Make  sure that you have a light switch at the top of the stairs and the bottom of the stairs. If you do not have them, ask someone to add them for you. What else can I do to help prevent falls? Wear shoes that: Do not have high heels. Have rubber bottoms. Are comfortable and fit you well. Are closed at the toe. Do not wear sandals. If you use a stepladder: Make sure that it is fully opened. Do not climb a closed stepladder. Make sure that both sides of the stepladder are locked into place. Ask someone to hold it for you, if possible. Clearly mark and make sure that you can see: Any grab bars or handrails. First and last steps. Where the edge of each step is. Use tools that help you move around (mobility aids) if they are needed. These  include: Canes. Walkers. Scooters. Crutches. Turn on the lights when you go into a dark area. Replace any light bulbs as soon as they burn out. Set up your furniture so you have a clear path. Avoid moving your furniture around. If any of your floors are uneven, fix them. If there are any pets around you, be aware of where they are. Review your medicines with your doctor. Some medicines can make you feel dizzy. This can increase your chance of falling. Ask your doctor what other things that you can do to help prevent falls. This information is not intended to replace advice given to you by your health care provider. Make sure you discuss any questions you have with your health care provider. Document Released: 11/11/2008 Document Revised: 06/23/2015 Document Reviewed: 02/19/2014 Elsevier Interactive Patient Education  2017 [employer].

## 2021-08-08 ENCOUNTER — Ambulatory Visit: Payer: Medicare Other | Admitting: Orthopaedic Surgery

## 2021-08-08 ENCOUNTER — Encounter: Payer: Self-pay | Admitting: Orthopaedic Surgery

## 2021-08-08 DIAGNOSIS — M25511 Pain in right shoulder: Secondary | ICD-10-CM | POA: Diagnosis not present

## 2021-08-08 DIAGNOSIS — M25512 Pain in left shoulder: Secondary | ICD-10-CM | POA: Diagnosis not present

## 2021-08-08 DIAGNOSIS — G8929 Other chronic pain: Secondary | ICD-10-CM | POA: Diagnosis not present

## 2021-08-08 MED ORDER — LIDOCAINE HCL 1 % IJ SOLN
3.0000 mL | INTRAMUSCULAR | Status: AC | PRN
  Administered 2021-08-08: 3 mL

## 2021-08-08 MED ORDER — METHYLPREDNISOLONE ACETATE 40 MG/ML IJ SUSP
40.0000 mg | INTRAMUSCULAR | Status: AC | PRN
  Administered 2021-08-08: 40 mg via INTRA_ARTICULAR

## 2021-08-08 NOTE — Progress Notes (Signed)
   Procedure Note  Patient: Beth Johnston             Date of Birth: 11/26/1925           MRN: [id number]             Visit Date: 08/08/2021 HPI: [name] returns today for bilateral shoulder injections.  She has chronic bilateral shoulder pain due to bilateral shoulder arthritis.  She states that the last injections given on 04/25/2021 gave her good results until recently.  She has had no injury to either shoulder.  Physical exam: General: Well-developed well-nourished female no acute distress ambulates without any assistive device. Bilateral shoulder she has very limited forward flexion to approximately 75 to 80 degrees with discomfort.  Procedures: Visit Diagnoses:  1. Chronic pain of both shoulders     Large Joint Inj: bilateral subacromial bursa on 08/08/2021 9:42 AM Indications: pain Details: 22 G 1.5 in needle, superior approach  Arthrogram: No  Medications (Right): 3 mL lidocaine 1 %; 40 mg methylPREDNISolone acetate 40 MG/ML Medications (Left): 3 mL lidocaine 1 %; 40 mg methylPREDNISolone acetate 40 MG/ML Outcome: tolerated well, no immediate complications Procedure, treatment alternatives, risks and benefits explained, specific risks discussed. Consent was given by the patient. Immediately prior to procedure a time out was called to verify the correct patient, procedure, equipment, support staff and site/side marked as required. Patient was prepped and draped in the usual sterile fashion.     Plan: She knows to wait at least 3 months between injections.  Follow-up with [name] in 3 months for repeat injections.  She does not want any surgical intervention on either shoulder.  Questions were encouraged and answered.

## 2021-09-04 DIAGNOSIS — Z85828 Personal history of other malignant neoplasm of skin: Secondary | ICD-10-CM | POA: Diagnosis not present

## 2021-09-04 DIAGNOSIS — L821 Other seborrheic keratosis: Secondary | ICD-10-CM | POA: Diagnosis not present

## 2021-09-04 DIAGNOSIS — D0439 Carcinoma in situ of skin of other parts of face: Secondary | ICD-10-CM | POA: Diagnosis not present

## 2021-09-04 DIAGNOSIS — L570 Actinic keratosis: Secondary | ICD-10-CM | POA: Diagnosis not present

## 2021-09-27 DIAGNOSIS — L570 Actinic keratosis: Secondary | ICD-10-CM | POA: Diagnosis not present

## 2021-09-27 DIAGNOSIS — D0439 Carcinoma in situ of skin of other parts of face: Secondary | ICD-10-CM | POA: Diagnosis not present

## 2021-11-22 ENCOUNTER — Ambulatory Visit: Payer: Medicare Other | Admitting: Orthopaedic Surgery

## 2021-11-29 ENCOUNTER — Encounter: Payer: Self-pay | Admitting: Orthopaedic Surgery

## 2021-11-29 ENCOUNTER — Ambulatory Visit: Payer: Medicare Other | Admitting: Orthopaedic Surgery

## 2021-11-29 DIAGNOSIS — M25512 Pain in left shoulder: Secondary | ICD-10-CM | POA: Diagnosis not present

## 2021-11-29 DIAGNOSIS — M25511 Pain in right shoulder: Secondary | ICD-10-CM

## 2021-11-29 DIAGNOSIS — G8929 Other chronic pain: Secondary | ICD-10-CM | POA: Diagnosis not present

## 2021-11-29 MED ORDER — METHYLPREDNISOLONE ACETATE 40 MG/ML IJ SUSP
40.0000 mg | INTRAMUSCULAR | Status: AC | PRN
  Administered 2021-11-29: 40 mg via INTRA_ARTICULAR

## 2021-11-29 MED ORDER — LIDOCAINE HCL 1 % IJ SOLN
3.0000 mL | INTRAMUSCULAR | Status: AC | PRN
  Administered 2021-11-29: 3 mL

## 2021-11-29 NOTE — Progress Notes (Signed)
The patient is well-known to me.  She is 19 and will actually 89 during the month.  She has bilateral shoulder rotator cuff arthropathy and comes in about every 3 to 4 months for injections in her shoulders.  Her daughter said the injections only last about a week but she does feel like they are still helping her.  There is been no change in her medical status.  Her shoulders are significantly weak on range of motion and have limitations in range of motion as before.  I did place a steroid injection in both shoulders today without difficulty.  We can always do this again in 3 months.     Procedure Note  Patient: Beth Johnston             Date of Birth: [birth year]           MRN: [id number]             Visit Date: 11/29/2021  Procedures: Visit Diagnoses:  1. Chronic pain of both shoulders     Large Joint Inj: R subacromial bursa on 11/29/2021 10:55 AM Indications: pain and diagnostic evaluation Details: 22 G 1.5 in needle  Arthrogram: No  Medications: 3 mL lidocaine 1 %; 40 mg methylPREDNISolone acetate 40 MG/ML Outcome: tolerated well, no immediate complications Procedure, treatment alternatives, risks and benefits explained, specific risks discussed. Consent was given by the patient. Immediately prior to procedure a time out was called to verify the correct patient, procedure, equipment, support staff and site/side marked as required. Patient was prepped and draped in the usual sterile fashion.    Large Joint Inj: L subacromial bursa on 11/29/2021 10:55 AM Indications: pain and diagnostic evaluation Details: 22 G 1.5 in needle  Arthrogram: No  Medications: 3 mL lidocaine 1 %; 40 mg methylPREDNISolone acetate 40 MG/ML Outcome: tolerated well, no immediate complications Procedure, treatment alternatives, risks and benefits explained, specific risks discussed. Consent was given by the patient. Immediately prior to procedure a time out was called to verify the correct patient,  procedure, equipment, support staff and site/side marked as required. Patient was prepped and draped in the usual sterile fashion.

## 2022-01-02 ENCOUNTER — Other Ambulatory Visit: Payer: Self-pay | Admitting: Cardiovascular Disease

## 2022-02-07 ENCOUNTER — Ambulatory Visit: Payer: Medicare Other | Admitting: Orthopaedic Surgery

## 2022-03-01 ENCOUNTER — Encounter: Payer: Self-pay | Admitting: Orthopaedic Surgery

## 2022-03-01 ENCOUNTER — Ambulatory Visit: Payer: Medicare Other | Admitting: Orthopaedic Surgery

## 2022-03-01 DIAGNOSIS — G8929 Other chronic pain: Secondary | ICD-10-CM | POA: Diagnosis not present

## 2022-03-01 DIAGNOSIS — M25511 Pain in right shoulder: Secondary | ICD-10-CM | POA: Diagnosis not present

## 2022-03-01 DIAGNOSIS — M25512 Pain in left shoulder: Secondary | ICD-10-CM

## 2022-03-01 MED ORDER — LIDOCAINE HCL 1 % IJ SOLN
3.0000 mL | INTRAMUSCULAR | Status: AC | PRN
  Administered 2022-03-01: 3 mL

## 2022-03-01 MED ORDER — METHYLPREDNISOLONE ACETATE 40 MG/ML IJ SUSP
40.0000 mg | INTRAMUSCULAR | Status: AC | PRN
  Administered 2022-03-01: 40 mg via INTRA_ARTICULAR

## 2022-03-01 NOTE — Progress Notes (Signed)
The patient is a 89 year old female well-known to [name].  She comes in every 3 months for steroid injections in both shoulder subacromial outlets.  She has rotator cuff arthropathy of both shoulders but does well every 3 months with shoulder injections.  Her daughter is with her today.  She has had no acute change in her medical status and is requesting steroid injections today in both shoulders.  It has been 3 months.  I did evaluate both shoulders and she can move them but there is definitely evidence of rotator cuff weakness and arthropathy.  I did place a steroid injection in both shoulder subacromial outlets which she tolerated well.  We can always do this again in 3 months.  All question concerns were answered and addressed.      Procedure Note  Patient: Beth Johnston             Date of Birth: 1925-04-22           MRN: [id number]             Visit Date: 03/01/2022  Procedures: Visit Diagnoses:  1. Chronic pain of both shoulders     Large Joint Inj: R subacromial bursa on 03/01/2022 3:53 PM Indications: pain and diagnostic evaluation Details: 22 G 1.5 in needle  Arthrogram: No  Medications: 3 mL lidocaine 1 %; 40 mg methylPREDNISolone acetate 40 MG/ML Outcome: tolerated well, no immediate complications Procedure, treatment alternatives, risks and benefits explained, specific risks discussed. Consent was given by the patient. Immediately prior to procedure a time out was called to verify the correct patient, procedure, equipment, support staff and site/side marked as required. Patient was prepped and draped in the usual sterile fashion.    Large Joint Inj: L subacromial bursa on 03/01/2022 3:53 PM Indications: pain and diagnostic evaluation Details: 22 G 1.5 in needle  Arthrogram: No  Medications: 3 mL lidocaine 1 %; 40 mg methylPREDNISolone acetate 40 MG/ML Outcome: tolerated well, no immediate complications Procedure, treatment alternatives, risks and benefits explained,  specific risks discussed. Consent was given by the patient. Immediately prior to procedure a time out was called to verify the correct patient, procedure, equipment, support staff and site/side marked as required. Patient was prepped and draped in the usual sterile fashion.

## 2022-04-25 ENCOUNTER — Telehealth: Payer: Medicare Other | Admitting: Nurse Practitioner

## 2022-04-25 DIAGNOSIS — J40 Bronchitis, not specified as acute or chronic: Secondary | ICD-10-CM

## 2022-04-25 MED ORDER — ALBUTEROL SULFATE (2.5 MG/3ML) 0.083% IN NEBU: 2.5000 mg | INHALATION_SOLUTION | Freq: Four times a day (QID) | RESPIRATORY_TRACT | 1 refills | Status: DC | PRN

## 2022-04-25 MED ORDER — ALBUTEROL SULFATE HFA 108 (90 BASE) MCG/ACT IN AERS: 2.0000 | INHALATION_SPRAY | Freq: Four times a day (QID) | RESPIRATORY_TRACT | 0 refills | Status: DC | PRN

## 2022-04-25 MED ORDER — AZITHROMYCIN 250 MG PO TABS: ORAL_TABLET | ORAL | 0 refills | Status: AC

## 2022-04-25 NOTE — Progress Notes (Signed)
Virtual Visit Consent   Beth Johnston, you are scheduled for a virtual visit with a [facility] provider today. Just as with appointments in the office, your consent must be obtained to participate. Your consent will be active for this visit and any virtual visit you may have with one of our providers in the next 365 days. If you have a MyChart account, a copy of this consent can be sent to you electronically.  As this is a virtual visit, video technology does not allow for your provider to perform a traditional examination. This may limit your provider's ability to fully assess your condition. If your provider identifies any concerns that need to be evaluated in person or the need to arrange testing (such as labs, EKG, etc.), we will make arrangements to do so. Although advances in technology are sophisticated, we cannot ensure that it will always work on either your end or our end. If the connection with a video visit is poor, the visit may have to be switched to a telephone visit. With either a video or telephone visit, we are not always able to ensure that we have a secure connection.  By engaging in this virtual visit, you consent to the provision of healthcare and authorize for your insurance to be billed (if applicable) for the services provided during this visit. Depending on your insurance coverage, you may receive a charge related to this service.  I need to obtain your verbal consent now. Are you willing to proceed with your visit today? Beth Johnston has provided verbal consent on 04/25/2022 for a virtual visit (video or telephone). Beth Schneiders, FNP  Date: 04/25/2022 3:04 PM  Virtual Visit via Video Note   I, Beth Johnston, connected with  Beth Johnston  ([id number], October 04, 1925) on 04/25/22 at  3:15 PM EDT by a video-enabled telemedicine application and verified that I am speaking with the correct person using two identifiers.  Location: Patient: Virtual Visit Location Patient:  Home Provider: Virtual Visit Location Provider: Home Office Daughter: present at home with patient   I discussed the limitations of evaluation and management by telemedicine and the availability of in person appointments. The patient expressed understanding and agreed to proceed.    History of Present Illness: Beth Johnston is a 89 y.o. who identifies as a female who was assigned female at birth, and is being seen today for a cough.  She goes to the gym everyday with her daughter and walks 2 miles   When she was at the gym today she started coughing more which alerted her daughter to schedule this appointment   The cough has been present for the past 2-3 days  No fever   She does have a history of asthma  Does not use her inhalers currently   She does have a runny nose  Her cough is productive   She has not needed steroids in the past routinely  Typically gets a bought of bronchitis each winter and responds well to azithromycin   Problems:  Patient Active Problem List   Diagnosis Date Noted   Stage 3b chronic kidney disease ([place]) 05/16/2021   Moderate Aortic stenosis 03/15/2021   Pure hypercholesterolemia 03/15/2021   Status post total replacement of left hip 10/17/2018   Unilateral primary osteoarthritis, left hip 03/24/2018   Chronic pain of both shoulders 02/27/2016   Elevated troponin    Stented coronary artery    Acute respiratory failure ([place]) 01/02/2014   CAD (coronary artery  disease) 01/02/2014   Pseudoaneurysm following PCI 01/02/2014   Blood loss anemia 01/02/2014   HFrEF (heart failure with reduced ejection fraction) ([facility])    hx of NSTEMI in 11/15 s/p DES x 2 to LAD w Impella support 12/26/2013   Moderate mitral regurgitation 12/26/2013   Ischemic dilated cardiomyopathy ([facility]) 12/26/2013   LBBB (left bundle branch block) 12/25/2013   Asthma, persistent controlled 05/28/2013   Upper airway cough syndrome 05/28/2013   Cough 05/11/2013   History of total knee  replacement 09/04/2011    Allergies:  Allergies  Allergen Reactions   Codeine     Nauseated   Lisinopril Cough   Tramadol     Confusion   Medications:  Current Outpatient Medications:    acetaminophen (TYLENOL) 650 MG CR tablet, Take 1,300 mg by mouth 2 (two) times daily., Disp: , Rfl:    atorvastatin (LIPITOR) 40 MG tablet, TAKE ONE-HALF TABLET BY MOUTH  ONCE DAILY AT 6 PM, Disp: 50 tablet, Rfl: 1   benzonatate (TESSALON PERLES) 100 MG capsule, Take 1 capsule (100 mg total) by mouth 3 (three) times daily as needed., Disp: 20 capsule, Rfl: 0   bisacodyl (DULCOLAX) 5 MG EC tablet, Take 10 mg by mouth daily., Disp: , Rfl:    carvedilol (COREG) 6.25 MG tablet, TAKE 1 TABLET BY MOUTH TWICE  DAILY WITH MEALS, Disp: 200 tablet, Rfl: 1   clopidogrel (PLAVIX) 75 MG tablet, TAKE 1 TABLET BY MOUTH DAILY  WITH BREAKFAST, Disp: 100 tablet, Rfl: 1   empagliflozin (JARDIANCE) 10 MG TABS tablet, Take 1 tablet (10 mg total) by mouth daily before breakfast., Disp: 90 tablet, Rfl: 2   furosemide (LASIX) 40 MG tablet, TAKE 1 TABLET BY MOUTH DAILY, Disp: 100 tablet, Rfl: 1   losartan (COZAAR) 25 MG tablet, TAKE 1 TABLET BY MOUTH DAILY, Disp: 100 tablet, Rfl: 1   spironolactone (ALDACTONE) 25 MG tablet, TAKE ONE-HALF TABLET BY MOUTH  TWICE DAILY AT 8 AM AND 6 PM, Disp: 100 tablet, Rfl: 1  Observations/Objective: Patient is well-developed, well-nourished in no acute distress.  Resting comfortably  at home.  Head is normocephalic, atraumatic.  No labored breathing.  Speech is clear and coherent with logical content.  Patient is alert and oriented at baseline.    Assessment and Plan: 1. Bronchitis  - albuterol (VENTOLIN HFA) 108 (90 Base) MCG/ACT inhaler; Inhale 2 puffs into the lungs every 6 (six) hours as needed for wheezing or shortness of breath.  Dispense: 8 g; Refill: 0 - azithromycin (ZITHROMAX) 250 MG tablet; Take 2 tablets on day 1, then 1 tablet daily on days 2 through 5  Dispense: 6 tablet;  Refill: 0 - albuterol (PROVENTIL) (2.5 MG/3ML) 0.083% nebulizer solution; Take 3 mLs (2.5 mg total) by nebulization every 6 (six) hours as needed for wheezing or shortness of breath.  Dispense: 150 mL; Refill: 1     Follow Up Instructions: I discussed the assessment and treatment plan with the patient. The patient was provided an opportunity to ask questions and all were answered. The patient agreed with the plan and demonstrated an understanding of the instructions.  A copy of instructions were sent to the patient via MyChart unless otherwise noted below.    The patient was advised to call back or seek an in-person evaluation if the symptoms worsen or if the condition fails to improve as anticipated.  Time:  I spent 15 minutes with the patient via telehealth technology discussing the above problems/concerns.    Beth Schneiders, FNP

## 2022-05-17 ENCOUNTER — Other Ambulatory Visit: Payer: Self-pay | Admitting: Nurse Practitioner

## 2022-05-17 DIAGNOSIS — J40 Bronchitis, not specified as acute or chronic: Secondary | ICD-10-CM

## 2022-05-28 ENCOUNTER — Ambulatory Visit: Payer: Medicare Other | Attending: Cardiovascular Disease | Admitting: Cardiovascular Disease

## 2022-05-28 VITALS — BP 118/64 | HR 77 | Ht 65.0 in | Wt 160.0 lb

## 2022-05-28 DIAGNOSIS — I350 Nonrheumatic aortic (valve) stenosis: Secondary | ICD-10-CM | POA: Diagnosis not present

## 2022-05-28 DIAGNOSIS — I2510 Atherosclerotic heart disease of native coronary artery without angina pectoris: Secondary | ICD-10-CM | POA: Diagnosis not present

## 2022-05-28 DIAGNOSIS — E7800 Pure hypercholesterolemia, unspecified: Secondary | ICD-10-CM | POA: Diagnosis not present

## 2022-05-28 DIAGNOSIS — I5020 Unspecified systolic (congestive) heart failure: Secondary | ICD-10-CM | POA: Diagnosis not present

## 2022-05-28 DIAGNOSIS — I340 Nonrheumatic mitral (valve) insufficiency: Secondary | ICD-10-CM

## 2022-05-28 NOTE — Progress Notes (Signed)
Chief Complaint  Patient presents with   Follow-up    CAD, ischemic cardiomyopathy   History of Present Illness: 89 yo female with history of CAD, ischemic cardiomyopathy, LBBB, mitral regurgitation and aortic stenosis who is here today for cardiac follow up. She was admitted to [facility] in November of 2015 with a NSTEMI and was found to have severe calcific disease in the LAD. LVEF was 10%. She refused CABG and underwent high risk PCI/rotablator atherectomy of the LAD with Impella LV support. 2 drug eluting stents were placed in the LAD. She has done well since then. Echo May 2019 with LVEF=30-35%, moderate AS. Most recent echo March 2023 with LVEF=30-35%. Mild mitral stenosis/regurgitation. Moderate low flow/low gradient aortic stenosis with mean gradient 10 mmHg, AVA 1.08 cm2, DI 0.38.   She is here today for follow up. The patient denies any chest pain, dyspnea, palpitations, lower extremity edema, orthopnea, PND, dizziness, near syncope or syncope. She continues to exercise 6 days per week, riding the bike at the gym for 2 miles.    Primary care provider:  Mliss Sax, MD  Past Medical History:  Diagnosis Date   Arthritis    "mostly my hands" (12/25/2013)   Asthma    CAD (coronary artery disease) 01/02/2014   S/p NSTEMI in 11/2013 Pt declined CABG PCI:  Rotablator atherectomy and DES x 2 to LAD w Impella support     HFrEF (heart failure with reduced ejection fraction) (HCC)    Ischemic cardiomyopathy  Intol of Spironolactone due to hyper K+ Echo 11/15: EF 10 Echo 5/19: EF 30-35 Echo 7/21: EF 30-35   Ischemic cardiomyopathy    a. EF 15%   LBBB (left bundle branch block) 12/25/2013   Mitral regurgitation 12/26/2013   Echo 5/19: Mild MR Echo 7/21: Moderate MR   Moderate Aortic stenosis 03/15/2021   Echocardiogram 06/04/2017: Moderate LVH, EF 30-35, dynamic obstruction with peak gradient 61 mmHg, anteroseptal/inferior/inferoseptal AK, AV mean gradient 12 mmHg, DI 0.33, AV peak  velocity 238 cm/s, mild MR Echocardiogram 08/24/2019: EF 30-35, mild LVH, GR 1 DD, normal RVSF, mild LAE, moderate MR, mean AV gradient 10 mmHg, DI 0.37, AV V-max 194 cm/s   PONV (postoperative nausea and vomiting)    Pure hypercholesterolemia 03/15/2021   Upper airway cough syndrome    Post-nasal drip    Past Surgical History:  Procedure Laterality Date   APPENDECTOMY  1960's   CARDIAC CATHETERIZATION  12/28/2013   Procedure: RIGHT/LEFT HEART CATH AND CORONARY ANGIOGRAPHY;  Surgeon: Lennette Bihari, MD;  Location: [facility] CATH LAB;  Service: Cardiovascular;;   CYSTOCELE REPAIR  12/18/2011   Procedure: ANTERIOR REPAIR (CYSTOCELE);  Surgeon: Miguel Aschoff, MD;  Location: [facility] ORS;  Service: Gynecology;  Laterality: N/A;   JOINT REPLACEMENT     JOINT REPLACEMENT Right 1980's   "ring finger"   PERCUTANEOUS CORONARY STENT INTERVENTION (PCI-S) N/A 12/30/2013   Procedure: PERCUTANEOUS CORONARY STENT INTERVENTION (PCI-S);  Surgeon: Kathleene Hazel, MD;  Location: [facility] CATH LAB;  Service: Cardiovascular;  Laterality: N/A;   REPLACEMENT TOTAL KNEE BILATERAL Bilateral 2009   TOTAL HIP ARTHROPLASTY Left 10/17/2018   Procedure: LEFT TOTAL HIP ARTHROPLASTY ANTERIOR APPROACH;  Surgeon: Kathryne Hitch, MD;  Location: [facility] ORS;  Service: Orthopedics;  Laterality: Left;    Current Outpatient Medications  Medication Sig Dispense Refill   acetaminophen (TYLENOL) 650 MG CR tablet Take 1,300 mg by mouth 2 (two) times daily.     atorvastatin (LIPITOR) 40 MG tablet TAKE ONE-HALF TABLET  BY MOUTH  ONCE DAILY AT 6 PM 50 tablet 1   carvedilol (COREG) 6.25 MG tablet TAKE 1 TABLET BY MOUTH TWICE  DAILY WITH MEALS 200 tablet 1   clopidogrel (PLAVIX) 75 MG tablet TAKE 1 TABLET BY MOUTH DAILY  WITH BREAKFAST 100 tablet 1   furosemide (LASIX) 40 MG tablet TAKE 1 TABLET BY MOUTH DAILY 100 tablet 1   losartan (COZAAR) 25 MG tablet TAKE 1 TABLET BY MOUTH DAILY 100 tablet 1   spironolactone (ALDACTONE) 25 MG tablet TAKE  ONE-HALF TABLET BY MOUTH  TWICE DAILY AT 8 AM AND 6 PM 100 tablet 1   albuterol (PROVENTIL) (2.5 MG/3ML) 0.083% nebulizer solution Take 3 mLs (2.5 mg total) by nebulization every 6 (six) hours as needed for wheezing or shortness of breath. (Patient not taking: Reported on 05/28/2022) 150 mL 1   albuterol (VENTOLIN HFA) 108 (90 Base) MCG/ACT inhaler Inhale 2 puffs into the lungs every 6 (six) hours as needed for wheezing or shortness of breath. (Patient not taking: Reported on 05/28/2022) 8 g 0   No current facility-administered medications for this visit.    Allergies  Allergen Reactions   Codeine     Nauseated   Lisinopril Cough   Tramadol     Confusion    Social History   Socioeconomic History   Marital status: Widowed    Spouse name: Not on file   Number of children: Not on file   Years of education: Not on file   Highest education level: Not on file  Occupational History   Not on file  Tobacco Use   Smoking status: Never   Smokeless tobacco: Never  Vaping Use   Vaping Use: Never used  Substance and Sexual Activity   Alcohol use: No   Drug use: No   Sexual activity: Not on file  Other Topics Concern   Not on file  Social History Narrative   Not on file   Social Determinants of Health   Financial Resource Strain: Low Risk  (06/20/2021)   Overall Financial Resource Strain (CARDIA)    Difficulty of Paying Living Expenses: Not hard at all  Food Insecurity: No Food Insecurity (06/20/2021)   Hunger Vital Sign    Worried About Running Out of Food in the Last Year: Never true    Ran Out of Food in the Last Year: Never true  Transportation Needs: No Transportation Needs (06/20/2021)   PRAPARE - Administrator, Civil Service (Medical): No    Lack of Transportation (Non-Medical): No  Physical Activity: Insufficiently Active (06/20/2021)   Exercise Vital Sign    Days of Exercise per Week: 3 days    Minutes of Exercise per Session: 30 min  Stress: No Stress Concern  Present (06/20/2021)   [employer] of Occupational Health - Occupational Stress Questionnaire    Feeling of Stress : Not at all  Social Connections: Moderately Isolated (06/20/2021)   Social Connection and Isolation Panel [NHANES]    Frequency of Communication with Friends and Family: Three times a week    Frequency of Social Gatherings with Friends and Family: Three times a week    Attends Religious Services: More than 4 times per year    Active Member of Clubs or Organizations: No    Attends Banker Meetings: Never    Marital Status: Widowed  Intimate Partner Violence: Not At Risk (06/20/2021)   Humiliation, Afraid, Rape, and Kick questionnaire    Fear of Current or Ex-Partner:  No    Emotionally Abused: No    Physically Abused: No    Sexually Abused: No    Family History  Problem Relation Age of Onset   Cerebral aneurysm Mother        died at age 54   Healthy Father        died of old age    Review of Systems:  As stated in the HPI and otherwise negative.   BP 118/64 (BP Location: Left Arm, Patient Position: Sitting, Cuff Size: Normal)   Pulse 77   Ht 5\' 5"  (1.651 m)   Wt 72.6 kg   SpO2 97%   BMI 26.63 kg/m   Physical Examination:  General: Well developed, well nourished, NAD  HEENT: OP clear, mucus membranes moist  SKIN: warm, dry. No rashes. Neuro: No focal deficits  Musculoskeletal: Muscle strength 5/5 all ext  Psychiatric: Mood and affect normal  Neck: No JVD, no carotid bruits, no thyromegaly, no lymphadenopathy.  Lungs:Clear bilaterally, no wheezes, rhonci, crackles Cardiovascular: Regular rate and rhythm. Systolic murmur.  Abdomen:Soft. Bowel sounds present. Non-tender.  Extremities: No lower extremity edema. Pulses are 2 + in the bilateral DP/PT.  Echo March 2023: 1. Left ventricular ejection fraction, by estimation, is 30 to 35%. The  left ventricle has moderately decreased function. The left ventricle  demonstrates regional wall motion  abnormalities with severe hypokinesis to  akinesis of the mid to apical  anteroseptal, inferoseptal, and anterior walls and the true apex.  Septal-lateral dyssynchrony is present. There is mild left ventricular  hypertrophy. Left ventricular diastolic parameters are consistent with  Grade I diastolic dysfunction (impaired  relaxation).   2. Right ventricular systolic function is normal. The right ventricular  size is normal. Tricuspid regurgitation signal is inadequate for assessing  PA pressure.   3. The mitral valve is degenerative. Mild mitral valve regurgitation.  Mild mitral stenosis. The mean mitral valve gradient is 5.0 mmHg. Moderate  mitral annular calcification.   4. The aortic valve is tricuspid. There is moderate calcification of the  aortic valve. Aortic valve regurgitation is not visualized. Low flow/low  gradient moderate aortic valve stenosis. Aortic valve area, by VTI  measures 1.08 cm. Aortic valve mean  gradient measures 10.0 mmHg.   5. Left atrial size was mildly dilated.   6. The inferior vena cava is normal in size with greater than 50%  respiratory variability, suggesting right atrial pressure of 3 mmHg.   EKG:  EKG is ordered today. The ekg ordered today demonstrates sinus, LBBB  Recent Labs: No results found for requested labs within last 365 days.   Lipid Panel    Component Value Date/Time   CHOL 124 05/02/2021 0918   CHOL 131 07/23/2019 0839   TRIG 58.0 05/02/2021 0918   HDL 62.30 05/02/2021 0918   HDL 46 07/23/2019 0839   CHOLHDL 2 05/02/2021 0918   VLDL 11.6 05/02/2021 0918   LDLCALC 50 05/02/2021 0918   LDLCALC 65 07/23/2019 0839     Wt Readings from Last 3 Encounters:  05/28/22 72.6 kg  04/25/21 73.9 kg  03/15/21 71.6 kg     Assessment and Plan:   1. CAD without angina: No chest pain suggestive of angina. Continue Plavix, statin, Coreg.        2. Ischemic cardiomyopathy: LVEF 30-35% by echo March 2023. Will continue beta blocker and  ARB. Aldactone stopped due to hyperkalemia.  3. Chronic systolic CHF: Weight is stable. No volume overload on exam. Continue Lasix.  4. LBBB: Chronic  5. Mitral regurgitation: Mild by echo in March 2023  6. Low flow/low gradient moderate aortic stenosis: She had moderate AS by echo in May 2019 which is unchanged on echo in 2023. She remains asymptomatic. She does not wish to consider any invasive procedures. Will likely not repeat her echo given advanced age.    7. Hyperlipidemia: LDL 50 in 2023. Lipids followed in primary care. Continue statin.    Labs/ tests ordered today include:   Orders Placed This Encounter  Procedures   EKG 12-Lead   Disposition:   FU with me in 12 months  Signed, Verne Carrow, MD 05/28/2022 4:35 PM    [facility] [address] [place], [place], [place]  [address] Phone: [phone]; Fax: [phone]

## 2022-05-28 NOTE — Patient Instructions (Signed)
Medication Instructions:  No changes *If you need a refill on your cardiac medications before your next appointment, please call your pharmacy*   Lab Work: none If you have labs (blood work) drawn today and your tests are completely normal, you will receive your results only by: MyChart Message (if you have MyChart) OR A paper copy in the mail If you have any lab test that is abnormal or we need to change your treatment, we will call you to review the results.   Testing/Procedures: none   Follow-Up: At [facility], you and your health needs are our priority.  As part of our continuing mission to provide you with exceptional heart care, we have created designated Provider Care Teams.  These Care Teams include your primary Cardiologist (physician) and Advanced Practice Providers (APPs -  Physician Assistants and Nurse Practitioners) who all work together to provide you with the care you need, when you need it.   Your next appointment:   12 month(s)  Provider:   Christopher McAlhany, MD      

## 2022-06-04 ENCOUNTER — Encounter: Payer: Self-pay | Admitting: Physician Assistant

## 2022-06-04 ENCOUNTER — Ambulatory Visit: Payer: Medicare Other | Admitting: Physician Assistant

## 2022-06-04 DIAGNOSIS — M25512 Pain in left shoulder: Secondary | ICD-10-CM

## 2022-06-04 DIAGNOSIS — M25511 Pain in right shoulder: Secondary | ICD-10-CM

## 2022-06-04 DIAGNOSIS — G8929 Other chronic pain: Secondary | ICD-10-CM

## 2022-06-04 MED ORDER — LIDOCAINE HCL 1 % IJ SOLN
3.0000 mL | INTRAMUSCULAR | Status: AC | PRN
Start: 2022-06-04 — End: 2022-06-04
  Administered 2022-06-04: 3 mL

## 2022-06-04 MED ORDER — METHYLPREDNISOLONE ACETATE 40 MG/ML IJ SUSP
40.0000 mg | INTRAMUSCULAR | Status: AC | PRN
Start: 2022-06-04 — End: 2022-06-04
  Administered 2022-06-04: 40 mg via INTRA_ARTICULAR

## 2022-06-04 NOTE — Progress Notes (Signed)
   Procedure Note  Patient: Beth Johnston             Date of Birth: Nov 16, 1925           MRN: [id number]             Visit Date: 06/04/2022  HPI: [name] comes in today requesting injections of both shoulders.  She has known rotator cuff arthropathy of both shoulders.  She states the injections continue to help.  She denies any fevers chills.  No adverse effects with past injections.  Physical exam: Bilateral shoulders she has pain with range of motion.  Diminished range of motion secondary to pain.   Procedures: Visit Diagnoses:  1. Chronic pain of both shoulders     Large Joint Inj: bilateral subacromial bursa on 06/04/2022 10:56 AM Indications: pain Details: 22 G 1.5 in needle, superolateral approach  Arthrogram: No  Medications (Right): 3 mL lidocaine 1 %; 40 mg methylPREDNISolone acetate 40 MG/ML Medications (Left): 3 mL lidocaine 1 %; 40 mg methylPREDNISolone acetate 40 MG/ML Outcome: tolerated well, no immediate complications Procedure, treatment alternatives, risks and benefits explained, specific risks discussed. Consent was given by the patient. Immediately prior to procedure a time out was called to verify the correct patient, procedure, equipment, support staff and site/side marked as required. Patient was prepped and draped in the usual sterile fashion.     Plan: She will follow-up as needed knows to wait at least 3 months between injections.

## 2022-06-05 ENCOUNTER — Telehealth: Payer: Self-pay | Admitting: Family Medicine

## 2022-06-05 NOTE — Telephone Encounter (Signed)
Contacted Mendi R Messing to schedule their annual wellness visit. Patient declined to schedule AWV at this time. Do not call  Rudell Cobb AWV direct phone # [phone]   Spoke to patient daughter   she stated patient only wants to see provider does not want to do AWV

## 2022-06-12 ENCOUNTER — Ambulatory Visit (INDEPENDENT_AMBULATORY_CARE_PROVIDER_SITE_OTHER): Payer: Medicare Other | Admitting: Family Medicine

## 2022-06-12 VITALS — BP 104/66 | HR 69 | Temp 98.1°F | Ht 65.0 in | Wt 157.4 lb

## 2022-06-12 DIAGNOSIS — R058 Other specified cough: Secondary | ICD-10-CM | POA: Diagnosis not present

## 2022-06-12 MED ORDER — PREDNISONE 10 MG PO TABS
10.0000 mg | ORAL_TABLET | Freq: Every day | ORAL | 0 refills | Status: AC
Start: 2022-06-12 — End: 2022-06-17

## 2022-06-12 MED ORDER — BENZONATATE 100 MG PO CAPS
100.0000 mg | ORAL_CAPSULE | Freq: Two times a day (BID) | ORAL | 0 refills | Status: DC
Start: 2022-06-12 — End: 2023-01-11

## 2022-06-12 NOTE — Progress Notes (Signed)
Established Patient Office Visit   Subjective:  Patient ID: Beth Johnston, female    DOB: 22-Apr-1925  Age: 89 y.o. MRN: [id number]  Chief Complaint  Patient presents with   Cough    Cough x 2 weeks    Cough Pertinent negatives include no eye redness, myalgias or rash.   Encounter Diagnoses  Name Primary?   Post-viral cough syndrome Yes   2-week history of a predominantly dry cough treated 2 weeks ago with Zithromax and albuterol.  Cough persists and remains dry.  There have been no fevers chills postnasal drip or reflux.  Energy levels are good.  Appetite remains normal.  Cough is sporadic.   Review of Systems  Constitutional: Negative.   HENT: Negative.    Eyes:  Negative for blurred vision, discharge and redness.  Respiratory:  Positive for cough.   Cardiovascular: Negative.   Gastrointestinal:  Negative for abdominal pain.  Genitourinary: Negative.   Musculoskeletal: Negative.  Negative for myalgias.  Skin:  Negative for rash.  Neurological:  Negative for tingling, loss of consciousness and weakness.  Endo/Heme/Allergies:  Negative for polydipsia.     Current Outpatient Medications:    acetaminophen (TYLENOL) 650 MG CR tablet, Take 1,300 mg by mouth 2 (two) times daily., Disp: , Rfl:    atorvastatin (LIPITOR) 40 MG tablet, TAKE ONE-HALF TABLET BY MOUTH  ONCE DAILY AT 6 PM, Disp: 50 tablet, Rfl: 1   benzonatate (TESSALON) 100 MG capsule, Take 1 capsule (100 mg total) by mouth 2 (two) times daily., Disp: 20 capsule, Rfl: 0   carvedilol (COREG) 6.25 MG tablet, TAKE 1 TABLET BY MOUTH TWICE  DAILY WITH MEALS, Disp: 200 tablet, Rfl: 1   clopidogrel (PLAVIX) 75 MG tablet, TAKE 1 TABLET BY MOUTH DAILY  WITH BREAKFAST, Disp: 100 tablet, Rfl: 1   furosemide (LASIX) 40 MG tablet, TAKE 1 TABLET BY MOUTH DAILY, Disp: 100 tablet, Rfl: 1   losartan (COZAAR) 25 MG tablet, TAKE 1 TABLET BY MOUTH DAILY, Disp: 100 tablet, Rfl: 1   predniSONE (DELTASONE) 10 MG tablet, Take 1 tablet  (10 mg total) by mouth daily with breakfast for 5 days., Disp: 5 tablet, Rfl: 0   spironolactone (ALDACTONE) 25 MG tablet, TAKE ONE-HALF TABLET BY MOUTH  TWICE DAILY AT 8 AM AND 6 PM, Disp: 100 tablet, Rfl: 1   Objective:     BP 104/66 (BP Location: Left Arm, Patient Position: Sitting, Cuff Size: Normal)   Pulse 69   Temp 98.1 F (36.7 C) (Temporal)   Ht 5\' 5"  (1.651 m)   Wt 157 lb 6.4 oz (71.4 kg)   SpO2 97%   BMI 26.19 kg/m    Physical Exam Constitutional:      General: She is not in acute distress.    Appearance: Normal appearance. She is not ill-appearing, toxic-appearing or diaphoretic.  HENT:     Head: Normocephalic and atraumatic.     Right Ear: External ear normal.     Left Ear: External ear normal.     Mouth/Throat:     Mouth: Mucous membranes are moist.     Pharynx: Oropharynx is clear. No oropharyngeal exudate or posterior oropharyngeal erythema.  Eyes:     General: No scleral icterus.       Right eye: No discharge.        Left eye: No discharge.     Extraocular Movements: Extraocular movements intact.     Conjunctiva/sclera: Conjunctivae normal.     Pupils: Pupils are equal, round,  and reactive to light.  Cardiovascular:     Rate and Rhythm: Normal rate and regular rhythm.  Pulmonary:     Effort: Pulmonary effort is normal. No respiratory distress.     Breath sounds: Normal breath sounds. No wheezing, rhonchi or rales.  Abdominal:     General: Bowel sounds are normal.     Tenderness: There is no abdominal tenderness. There is no guarding.  Musculoskeletal:     Cervical back: No rigidity or tenderness.  Skin:    General: Skin is warm and dry.  Neurological:     Mental Status: She is alert and oriented to person, place, and time.  Psychiatric:        Mood and Affect: Mood normal.        Behavior: Behavior normal.      No results found for any visits on 06/12/22.    The ASCVD Risk score (Arnett DK, et al., 2019) failed to calculate for the  following reasons:   The 2019 ASCVD risk score is only valid for ages 18 to 15   The patient has a prior MI or stroke diagnosis    Assessment & Plan:   Post-viral cough syndrome -     predniSONE; Take 1 tablet (10 mg total) by mouth daily with breakfast for 5 days.  Dispense: 5 tablet; Refill: 0 -     Benzonatate; Take 1 capsule (100 mg total) by mouth 2 (two) times daily.  Dispense: 20 capsule; Refill: 0    Return in about 1 week (around 06/19/2022), or if symptoms worsen or fail to improve.    Mliss Sax, MD

## 2022-06-15 ENCOUNTER — Other Ambulatory Visit: Payer: Self-pay | Admitting: Cardiovascular Disease

## 2022-06-21 ENCOUNTER — Encounter: Payer: Self-pay | Admitting: Family Medicine

## 2022-06-21 DIAGNOSIS — J22 Unspecified acute lower respiratory infection: Secondary | ICD-10-CM

## 2022-06-21 MED ORDER — AMOXICILLIN-POT CLAVULANATE 875-125 MG PO TABS
1.0000 | ORAL_TABLET | Freq: Two times a day (BID) | ORAL | 0 refills | Status: DC
Start: 2022-06-21 — End: 2023-02-05

## 2022-07-15 ENCOUNTER — Telehealth: Payer: Medicare Other | Admitting: Nurse Practitioner

## 2022-07-15 DIAGNOSIS — H1013 Acute atopic conjunctivitis, bilateral: Secondary | ICD-10-CM

## 2022-07-15 DIAGNOSIS — H109 Unspecified conjunctivitis: Secondary | ICD-10-CM | POA: Diagnosis not present

## 2022-07-15 DIAGNOSIS — B9689 Other specified bacterial agents as the cause of diseases classified elsewhere: Secondary | ICD-10-CM

## 2022-07-15 MED ORDER — OLOPATADINE HCL 0.2 % OP SOLN
1.0000 [drp] | Freq: Every day | OPHTHALMIC | 0 refills | Status: DC
Start: 2022-07-15 — End: 2023-02-05

## 2022-07-15 MED ORDER — POLYMYXIN B-TRIMETHOPRIM 10000-0.1 UNIT/ML-% OP SOLN
1.0000 [drp] | OPHTHALMIC | 0 refills | Status: AC
Start: 2022-07-15 — End: ?

## 2022-07-15 NOTE — Progress Notes (Signed)
I have spent 5 minutes in review of e-visit questionnaire, review and updating patient chart, medical decision making and response to patient.  ° °Shamell Suarez W Atha Mcbain, NP ° °  °

## 2022-07-15 NOTE — Progress Notes (Signed)
E visit for Allergic Conjunctivitis We are sorry that you are not feeling well.  Here is how we plan to help!  Based on what you have shared with me it looks like you have allergic conjunctivitis.  I recommend the following over the counter treatments: Olapatadine 1 drop in both eyes daily  HOME CARE:  Avoid areas where there is heavy dust, mites, or molds Stay indoors on windy days during the pollen season Keep windows closed in home, at least in bedroom; use air conditioner. Use high-efficiency house air filter Keep windows closed in car, turn AC on re-circulate Avoid playing out with dog during pollen season  GET HELP RIGHT AWAY IF:  If your symptoms do not improve within 10 days You become short of breath You develop yellow or green discharge from your nose for over 3 days You have coughing fits  MAKE SURE YOU:  Understand these instructions Will watch your condition Will get help right away if you are not doing well or get worse  Thank you for choosing an e-visit. Your e-visit answers were reviewed by a board certified advanced clinical practitioner to complete your personal care plan. Depending upon the condition, your plan could have included both over the counter or prescription medications. Please review your pharmacy choice. Be sure that the pharmacy you have chosen is open so that you can pick up your prescription now.  If there is a problem you may message your provider in MyChart to have the prescription routed to another pharmacy. Your safety is important to [name]. If you have drug allergies check your prescription carefully.  For the next 24 hours, you can use MyChart to ask questions about today's visit, request a non-urgent call back, or ask for a work or school excuse from your e-visit provider. You will get an email in the next two days asking about your experience. I hope that your e-visit has been valuable and will speed your recovery.

## 2022-07-15 NOTE — Addendum Note (Signed)
Addended by: Bertram Denver on: 07/15/2022 05:47 PM   Modules accepted: Orders

## 2022-08-10 ENCOUNTER — Ambulatory Visit (INDEPENDENT_AMBULATORY_CARE_PROVIDER_SITE_OTHER): Payer: Medicare Other

## 2022-08-10 VITALS — Ht 65.0 in | Wt 155.0 lb

## 2022-08-10 DIAGNOSIS — Z0000 Encounter for general adult medical examination without abnormal findings: Secondary | ICD-10-CM | POA: Diagnosis not present

## 2022-08-10 NOTE — Patient Instructions (Signed)
Beth Johnston , Thank you for taking time to come for your Medicare Wellness Visit. I appreciate your ongoing commitment to your health goals. Please review the following plan we discussed and let me know if I can assist you in the future.   These are the goals we discussed:  Goals      Patient Stated     08/10/2022, keep living        This is a list of the screening recommended for you and due dates:  Health Maintenance  Topic Date Due   Medicare Annual Wellness Visit  08/10/2023   HPV Vaccine  Aged Out   DTaP/Tdap/Td vaccine  Discontinued   Pneumonia Vaccine  Discontinued   Flu Shot  Discontinued   DEXA scan (bone density measurement)  Discontinued   COVID-19 Vaccine  Discontinued   Zoster (Shingles) Vaccine  Discontinued    Advanced directives: copy in chart  Conditions/risks identified: none  Next appointment: Follow up in one year for your annual wellness visit    Preventive Care 65 Years and Older, Female Preventive care refers to lifestyle choices and visits with your health care provider that can promote health and wellness. What does preventive care include? A yearly physical exam. This is also called an annual well check. Dental exams once or twice a year. Routine eye exams. Ask your health care provider how often you should have your eyes checked. Personal lifestyle choices, including: Daily care of your teeth and gums. Regular physical activity. Eating a healthy diet. Avoiding tobacco and drug use. Limiting alcohol use. Practicing safe sex. Taking low-dose aspirin every day. Taking vitamin and mineral supplements as recommended by your health care provider. What happens during an annual well check? The services and screenings done by your health care provider during your annual well check will depend on your age, overall health, lifestyle risk factors, and family history of disease. Counseling  Your health care provider may ask you questions about  your: Alcohol use. Tobacco use. Drug use. Emotional well-being. Home and relationship well-being. Sexual activity. Eating habits. History of falls. Memory and ability to understand (cognition). Work and work Astronomer. Reproductive health. Screening  You may have the following tests or measurements: Height, weight, and BMI. Blood pressure. Lipid and cholesterol levels. These may be checked every 5 years, or more frequently if you are over 29 years old. Skin check. Lung cancer screening. You may have this screening every year starting at age 8 if you have a 30-pack-year history of smoking and currently smoke or have quit within the past 15 years. Fecal occult blood test (FOBT) of the stool. You may have this test every year starting at age 73. Flexible sigmoidoscopy or colonoscopy. You may have a sigmoidoscopy every 5 years or a colonoscopy every 10 years starting at age 7. Hepatitis C blood test. Hepatitis B blood test. Sexually transmitted disease (STD) testing. Diabetes screening. This is done by checking your blood sugar (glucose) after you have not eaten for a while (fasting). You may have this done every 1-3 years. Bone density scan. This is done to screen for osteoporosis. You may have this done starting at age 38. Mammogram. This may be done every 1-2 years. Talk to your health care provider about how often you should have regular mammograms. Talk with your health care provider about your test results, treatment options, and if necessary, the need for more tests. Vaccines  Your health care provider may recommend certain vaccines, such as: Influenza vaccine. This  is recommended every year. Tetanus, diphtheria, and acellular pertussis (Tdap, Td) vaccine. You may need a Td booster every 10 years. Zoster vaccine. You may need this after age 39. Pneumococcal 13-valent conjugate (PCV13) vaccine. One dose is recommended after age 32. Pneumococcal polysaccharide (PPSV23) vaccine.  One dose is recommended after age 23. Talk to your health care provider about which screenings and vaccines you need and how often you need them. This information is not intended to replace advice given to you by your health care provider. Make sure you discuss any questions you have with your health care provider. Document Released: 02/11/2015 Document Revised: 10/05/2015 Document Reviewed: 11/16/2014 Elsevier Interactive Patient Education  2017 [employer].  Fall Prevention in the Home Falls can cause injuries. They can happen to people of all ages. There are many things you can do to make your home safe and to help prevent falls. What can I do on the outside of my home? Regularly fix the edges of walkways and driveways and fix any cracks. Remove anything that might make you trip as you walk through a door, such as a raised step or threshold. Trim any bushes or trees on the path to your home. Use bright outdoor lighting. Clear any walking paths of anything that might make someone trip, such as rocks or tools. Regularly check to see if handrails are loose or broken. Make sure that both sides of any steps have handrails. Any raised decks and porches should have guardrails on the edges. Have any leaves, snow, or ice cleared regularly. Use sand or salt on walking paths during winter. Clean up any spills in your garage right away. This includes oil or grease spills. What can I do in the bathroom? Use night lights. Install grab bars by the toilet and in the tub and shower. Do not use towel bars as grab bars. Use non-skid mats or decals in the tub or shower. If you need to sit down in the shower, use a plastic, non-slip stool. Keep the floor dry. Clean up any water that spills on the floor as soon as it happens. Remove soap buildup in the tub or shower regularly. Attach bath mats securely with double-sided non-slip rug tape. Do not have throw rugs and other things on the floor that can make  you trip. What can I do in the bedroom? Use night lights. Make sure that you have a light by your bed that is easy to reach. Do not use any sheets or blankets that are too big for your bed. They should not hang down onto the floor. Have a firm chair that has side arms. You can use this for support while you get dressed. Do not have throw rugs and other things on the floor that can make you trip. What can I do in the kitchen? Clean up any spills right away. Avoid walking on wet floors. Keep items that you use a lot in easy-to-reach places. If you need to reach something above you, use a strong step stool that has a grab bar. Keep electrical cords out of the way. Do not use floor polish or wax that makes floors slippery. If you must use wax, use non-skid floor wax. Do not have throw rugs and other things on the floor that can make you trip. What can I do with my stairs? Do not leave any items on the stairs. Make sure that there are handrails on both sides of the stairs and use them. Fix handrails that  are broken or loose. Make sure that handrails are as long as the stairways. Check any carpeting to make sure that it is firmly attached to the stairs. Fix any carpet that is loose or worn. Avoid having throw rugs at the top or bottom of the stairs. If you do have throw rugs, attach them to the floor with carpet tape. Make sure that you have a light switch at the top of the stairs and the bottom of the stairs. If you do not have them, ask someone to add them for you. What else can I do to help prevent falls? Wear shoes that: Do not have high heels. Have rubber bottoms. Are comfortable and fit you well. Are closed at the toe. Do not wear sandals. If you use a stepladder: Make sure that it is fully opened. Do not climb a closed stepladder. Make sure that both sides of the stepladder are locked into place. Ask someone to hold it for you, if possible. Clearly mark and make sure that you can  see: Any grab bars or handrails. First and last steps. Where the edge of each step is. Use tools that help you move around (mobility aids) if they are needed. These include: Canes. Walkers. Scooters. Crutches. Turn on the lights when you go into a dark area. Replace any light bulbs as soon as they burn out. Set up your furniture so you have a clear path. Avoid moving your furniture around. If any of your floors are uneven, fix them. If there are any pets around you, be aware of where they are. Review your medicines with your doctor. Some medicines can make you feel dizzy. This can increase your chance of falling. Ask your doctor what other things that you can do to help prevent falls. This information is not intended to replace advice given to you by your health care provider. Make sure you discuss any questions you have with your health care provider. Document Released: 11/11/2008 Document Revised: 06/23/2015 Document Reviewed: 02/19/2014 Elsevier Interactive Patient Education  2017 [employer].

## 2022-08-10 NOTE — Progress Notes (Signed)
Subjective:   Beth Johnston is a 89 y.o. female who presents for Medicare Annual (Subsequent) preventive examination.  Visit Complete: Virtual  I connected with  Beth Johnston on 08/10/22 by a audio enabled telemedicine application and verified that I am speaking with the correct person using two identifiers.  Patient Location: Home  Provider Location: Office/Clinic  I discussed the limitations of evaluation and management by telemedicine. The patient expressed understanding and agreed to proceed.  Per patient no change in vitals since last visit, unable to obtain new vitals due to telehealth visit     Review of Systems     Cardiac Risk Factors include: advanced age (>65men, >34 women)     Objective:    Today's Vitals   08/10/22 1301  Weight: 155 lb (70.3 kg)  Height: 5\' 5"  (1.651 m)   Body mass index is 25.79 kg/m.     08/10/2022    1:08 PM 06/20/2021   10:40 AM 10/17/2018    3:00 PM 09/23/2018    2:28 PM 11/28/2015    1:40 PM 12/25/2013    2:04 PM 12/25/2013    5:55 AM  Advanced Directives  Does Patient Have a Medical Advance Directive? Yes Yes Yes Yes Yes Yes No  Type of Estate agent of [place];Living will Healthcare Power of [place];Living will Healthcare Power of [place];Living will Healthcare Power of [place];Living will Healthcare Power of Attorney    Does patient want to make changes to medical advance directive?   No - Patient declined No - Patient declined  No - Patient declined   Copy of Healthcare Power of Attorney in Chart? Yes - validated most recent copy scanned in chart (See row information) No - copy requested Yes - validated most recent copy scanned in chart (See row information) Yes - validated most recent copy scanned in chart (See row information)  No - copy requested     Current Medications (verified) Outpatient Encounter Medications as of 08/10/2022  Medication Sig   acetaminophen (TYLENOL) 650 MG CR tablet Take  1,300 mg by mouth 2 (two) times daily.   atorvastatin (LIPITOR) 40 MG tablet TAKE ONE-HALF TABLET BY MOUTH  ONCE DAILY AT 6 PM   carvedilol (COREG) 6.25 MG tablet TAKE 1 TABLET BY MOUTH TWICE  DAILY WITH MEALS   clopidogrel (PLAVIX) 75 MG tablet TAKE 1 TABLET BY MOUTH DAILY  WITH BREAKFAST   furosemide (LASIX) 40 MG tablet TAKE 1 TABLET BY MOUTH DAILY   losartan (COZAAR) 25 MG tablet TAKE 1 TABLET BY MOUTH DAILY   spironolactone (ALDACTONE) 25 MG tablet TAKE ONE-HALF TABLET BY MOUTH  TWICE DAILY AT 8 AM AND 6 PM   amoxicillin-clavulanate (AUGMENTIN) 875-125 MG tablet Take 1 tablet by mouth 2 (two) times daily. (Patient not taking: Reported on 08/10/2022)   benzonatate (TESSALON) 100 MG capsule Take 1 capsule (100 mg total) by mouth 2 (two) times daily. (Patient not taking: Reported on 08/10/2022)   Olopatadine HCl 0.2 % SOLN Apply 1 drop to eye daily. (Patient not taking: Reported on 08/10/2022)   trimethoprim-polymyxin b (POLYTRIM) ophthalmic solution Place 1 drop into both eyes every 4 (four) hours. (Patient not taking: Reported on 08/10/2022)   No facility-administered encounter medications on file as of 08/10/2022.    Allergies (verified) Codeine, Lisinopril, and Tramadol   History: Past Medical History:  Diagnosis Date   Arthritis    "mostly my hands" (12/25/2013)   Asthma    CAD (coronary artery disease) 01/02/2014  S/p NSTEMI in 11/2013 Pt declined CABG PCI:  Rotablator atherectomy and DES x 2 to LAD w Impella support     HFrEF (heart failure with reduced ejection fraction) (HCC)    Ischemic cardiomyopathy  Intol of Spironolactone due to hyper K+ Echo 11/15: EF 10 Echo 5/19: EF 30-35 Echo 7/21: EF 30-35   Ischemic cardiomyopathy    a. EF 15%   LBBB (left bundle branch block) 12/25/2013   Mitral regurgitation 12/26/2013   Echo 5/19: Mild MR Echo 7/21: Moderate MR   Moderate Aortic stenosis 03/15/2021   Echocardiogram 06/04/2017: Moderate LVH, EF 30-35, dynamic obstruction with peak  gradient 61 mmHg, anteroseptal/inferior/inferoseptal AK, AV mean gradient 12 mmHg, DI 0.33, AV peak velocity 238 cm/s, mild MR Echocardiogram 08/24/2019: EF 30-35, mild LVH, GR 1 DD, normal RVSF, mild LAE, moderate MR, mean AV gradient 10 mmHg, DI 0.37, AV V-max 194 cm/s   PONV (postoperative nausea and vomiting)    Pure hypercholesterolemia 03/15/2021   Upper airway cough syndrome    Post-nasal drip   Past Surgical History:  Procedure Laterality Date   APPENDECTOMY  1960's   CARDIAC CATHETERIZATION  12/28/2013   Procedure: RIGHT/LEFT HEART CATH AND CORONARY ANGIOGRAPHY;  Surgeon: Lennette Bihari, MD;  Location[facility] C[facility] LAB;  Service: Cardiovascular;;   CYSTOCELE REPAIR  12/18/2011   Procedure: ANTERIOR REPAIR (CYSTOCELE);  Surgeon: Miguel Aschoff, MD;  Location: [facility] ORS;  Service: Gynecology;  Laterality: N/A;   JOINT REPLACEMENT     JOINT REPLACEMENT Right 1980's   "ring finger"   PERCUTANEOUS CORONARY STENT INTERVENTION (PCI-S) N/A 12/30/2013   Procedure: PERCUTANEOUS CORONARY STENT INTERVENTION (PCI-S);  Surgeon: Kathleene Hazel, MD;  Location: [facility] CATH LAB;  Service: Cardiovascular;  Laterality: N/A;   REPLACEMENT TOTAL KNEE BILATERAL Bilateral 2009   TOTAL HIP ARTHROPLASTY Left 10/17/2018   Procedure: LEFT TOTAL HIP ARTHROPLASTY ANTERIOR APPROACH;  Surgeon: Kathryne Hitch, MD;  Location: [facility] ORS;  Service: Orthopedics;  Laterality: Left;   Family History  Problem Relation Age of Onset   Cerebral aneurysm Mother        died at age 47   Healthy Father        died of old age   Social History   Socioeconomic History   Marital status: Widowed    Spouse name: Not on file   Number of children: Not on file   Years of education: Not on file   Highest education level: 12th grade  Occupational History   Not on file  Tobacco Use   Smoking status: Never   Smokeless tobacco: Never  Vaping Use   Vaping status: Never Used  Substance and Sexual Activity   Alcohol use: No   Drug  use: No   Sexual activity: Not on file  Other Topics Concern   Not on file  Social History Narrative   Not on file   Social Determinants of Health   Financial Resource Strain: Low Risk  (08/10/2022)   Overall Financial Resource Strain (CARDIA)    Difficulty of Paying Living Expenses: Not hard at all  Food Insecurity: No Food Insecurity (08/10/2022)   Hunger Vital Sign    Worried About Running Out of Food in the Last Year: Never true    Ran Out of Food in the Last Year: Never true  Transportation Needs: No Transportation Needs (08/10/2022)   PRAPARE - Administrator, Civil Service (Medical): No    Lack of Transportation (Non-Medical): No  Physical Activity: Sufficiently  Active (08/10/2022)   Exercise Vital Sign    Days of Exercise per Week: 5 days    Minutes of Exercise per Session: 40 min  Stress: No Stress Concern Present (08/10/2022)   [employer] of Occupational Health - Occupational Stress Questionnaire    Feeling of Stress : Not at all  Social Connections: Moderately Isolated (08/10/2022)   Social Connection and Isolation Panel [NHANES]    Frequency of Communication with Friends and Family: More than three times a week    Frequency of Social Gatherings with Friends and Family: More than three times a week    Attends Religious Services: More than 4 times per year    Active Member of [employer] or Organizations: No    Attends Banker Meetings: Never    Marital Status: Widowed    Tobacco Counseling Counseling given: Not Answered   Clinical Intake:  Pre-visit preparation completed: Yes  Pain : No/denies pain     Nutritional Status: BMI 25 -29 Overweight Nutritional Risks: None Diabetes: No  How often do you need to have someone help you when you read instructions, pamphlets, or other written materials from your doctor or pharmacy?: 1 - Never  Interpreter Needed?: No  Information entered by :: NAllen LPN   Activities of Daily Living     08/10/2022    1:03 PM  In your present state of health, do you have any difficulty performing the following activities:  Hearing? 0  Vision? 0  Difficulty concentrating or making decisions? 0  Walking or climbing stairs? 0  Dressing or bathing? 0  Doing errands, shopping? 0  Preparing Food and eating ? N  Using the Toilet? N  In the past six months, have you accidently leaked urine? N  Do you have problems with loss of bowel control? N  Managing your Medications? N  Managing your Finances? N  Housekeeping or managing your Housekeeping? N    Patient Care Team: Mliss Sax, MD as PCP - General (Family Medicine) Kathleene Hazel, MD as PCP - Cardiology (Cardiology)  Indicate any recent Medical Services you may have received from other than [facility] providers in the past year (date may be approximate).     Assessment:   This is a routine wellness examination for Beth Johnston.  Hearing/Vision screen Hearing Screening - Comments:: Denies hearing issues Vision Screening - Comments:: No regular eye exams, in [place], Dr. Gala Murdoch  Dietary issues and exercise activities discussed:     Goals Addressed             This Visit's Progress    Patient Stated       08/10/2022, keep living       Depression Screen    08/10/2022    1:10 PM 06/12/2022    3:31 PM 06/20/2021   10:41 AM 06/20/2021   10:39 AM 05/16/2021    2:29 PM 04/25/2021    2:46 PM 04/25/2021    2:16 PM  PHQ 2/9 Scores  PHQ - 2 Score 0 0 0 0 0 0 0  PHQ- 9 Score 0     0     Fall Risk    08/10/2022    1:09 PM 06/12/2022    3:31 PM 06/20/2021   10:41 AM 05/16/2021    2:29 PM 04/25/2021    2:16 PM  Fall Risk   Falls in the past year? 0 0 0 0 0  Number falls in past yr: 0 0 0 0 0  Injury with  Fall? 0 0 0    Risk for fall due to : Medication side effect No Fall Risks     Follow up Falls prevention discussed;Falls evaluation completed Falls evaluation completed Falls evaluation completed      MEDICARE RISK  AT HOME:  Medicare Risk at Home - 08/10/22 1309     Any stairs in or around the home? Yes    If so, are there any without handrails? No    Home free of loose throw rugs in walkways, pet beds, electrical cords, etc? Yes    Adequate lighting in your home to reduce risk of falls? Yes    Life alert? No    Use of a cane, walker or w/c? No    Grab bars in the bathroom? Yes    Shower chair or bench in shower? Yes    Elevated toilet seat or a handicapped toilet? Yes             TIMED UP AND GO:  Was the test performed?  No    Cognitive Function:        08/10/2022    1:10 PM  6CIT Screen  What Year? 0 points  What month? 3 points  What time? 0 points  Count back from 20 0 points  Months in reverse 0 points  Repeat phrase 0 points  Total Score 3 points    Immunizations  There is no immunization history on file for this patient.  TDAP status: Due, Education has been provided regarding the importance of this vaccine. Advised may receive this vaccine at local pharmacy or Health Dept. Aware to provide a copy of the vaccination record if obtained from local pharmacy or Health Dept. Verbalized acceptance and understanding.  Flu Vaccine status: Declined, Education has been provided regarding the importance of this vaccine but patient still declined. Advised may receive this vaccine at local pharmacy or Health Dept. Aware to provide a copy of the vaccination record if obtained from local pharmacy or Health Dept. Verbalized acceptance and understanding.  Pneumococcal vaccine status: Declined,  Education has been provided regarding the importance of this vaccine but patient still declined. Advised may receive this vaccine at local pharmacy or Health Dept. Aware to provide a copy of the vaccination record if obtained from local pharmacy or Health Dept. Verbalized acceptance and understanding.   Covid-19 vaccine status: Declined, Education has been provided regarding the importance of this  vaccine but patient still declined. Advised may receive this vaccine at local pharmacy or Health Dept.or vaccine clinic. Aware to provide a copy of the vaccination record if obtained from local pharmacy or Health Dept. Verbalized acceptance and understanding.  Qualifies for Shingles Vaccine? Yes   Zostavax completed No   Shingrix Completed?: No.    Education has been provided regarding the importance of this vaccine. Patient has been advised to call insurance company to determine out of pocket expense if they have not yet received this vaccine. Advised may also receive vaccine at local pharmacy or Health Dept. Verbalized acceptance and understanding.  Screening Tests Health Maintenance  Topic Date Due   Medicare Annual Wellness (AWV)  08/10/2023   HPV VACCINES  Aged Out   DTaP/Tdap/Td  Discontinued   Pneumonia Vaccine 40+ Years old  Discontinued   INFLUENZA VACCINE  Discontinued   DEXA SCAN  Discontinued   COVID-19 Vaccine  Discontinued   Zoster Vaccines- Shingrix  Discontinued    Health Maintenance  There are no preventive care reminders to display for  this patient.   Colorectal cancer screening: No longer required.   Mammogram status: No longer required due to age.  Bone Density status: declined  Lung Cancer Screening: (Low Dose CT Chest recommended if Age 74-80 years, 20 pack-year currently smoking OR have quit w/in 15years.) does not qualify.   Lung Cancer Screening Referral: no  Additional Screening:  Hepatitis C Screening: does not qualify;  Vision Screening: Recommended annual ophthalmology exams for early detection of glaucoma and other disorders of the eye. Is the patient up to date with their annual eye exam?  Yes  Who is the provider or what is the name of the office in which the patient attends annual eye exams? In [place] If pt is not established with a provider, would they like to be referred to a provider to establish care? No .   Dental Screening:  Recommended annual dental exams for proper oral hygiene  Diabetic Foot Exam: n/a  Community Resource Referral / Chronic Care Management: CRR required this visit?  No   CCM required this visit?  No     Plan:     I have personally reviewed and noted the following in the patient's chart:   Medical and social history Use of alcohol, tobacco or illicit drugs  Current medications and supplements including opioid prescriptions. Patient is not currently taking opioid prescriptions. Functional ability and status Nutritional status Physical activity Advanced directives List of other physicians Hospitalizations, surgeries, and ER visits in previous 12 months Vitals Screenings to include cognitive, depression, and falls Referrals and appointments  In addition, I have reviewed and discussed with patient certain preventive protocols, quality metrics, and best practice recommendations. A written personalized care plan for preventive services as well as general preventive health recommendations were provided to patient.     Barb Merino, LPN   10/13/7827   After Visit Summary: (MyChart) Due to this being a telephonic visit, the after visit summary with patients personalized plan was offered to patient via MyChart   Nurse Notes: none

## 2022-09-05 ENCOUNTER — Ambulatory Visit: Payer: Medicare Other | Admitting: Orthopaedic Surgery

## 2022-09-10 ENCOUNTER — Ambulatory Visit: Payer: Medicare Other | Admitting: Orthopaedic Surgery

## 2022-09-10 ENCOUNTER — Encounter: Payer: Self-pay | Admitting: Orthopaedic Surgery

## 2022-09-10 DIAGNOSIS — M25511 Pain in right shoulder: Secondary | ICD-10-CM | POA: Diagnosis not present

## 2022-09-10 DIAGNOSIS — G8929 Other chronic pain: Secondary | ICD-10-CM

## 2022-09-10 DIAGNOSIS — M25512 Pain in left shoulder: Secondary | ICD-10-CM

## 2022-09-10 NOTE — Progress Notes (Signed)
The patient is well to me.  She comes in every 3 months for bilateral shoulder injections with a steroid in the subacromial space.  She is 89 years old.  She has had no acute change in her medical status.  Family here to take care of her or with her.  She is requesting injections again today and it has been 3 months.  Both shoulders have limitations in range of motion and strength with grinding of the glenohumeral joint and pain in the subacromial outlet.  I did place steroid injections in both shoulder subacromial outlets which she tolerated very well.  We can do this again in 3 months.

## 2022-09-20 DIAGNOSIS — C44729 Squamous cell carcinoma of skin of left lower limb, including hip: Secondary | ICD-10-CM | POA: Diagnosis not present

## 2022-09-20 DIAGNOSIS — C44722 Squamous cell carcinoma of skin of right lower limb, including hip: Secondary | ICD-10-CM | POA: Diagnosis not present

## 2022-11-07 DIAGNOSIS — L0889 Other specified local infections of the skin and subcutaneous tissue: Secondary | ICD-10-CM | POA: Diagnosis not present

## 2022-11-07 DIAGNOSIS — Z85828 Personal history of other malignant neoplasm of skin: Secondary | ICD-10-CM | POA: Diagnosis not present

## 2022-11-07 DIAGNOSIS — L821 Other seborrheic keratosis: Secondary | ICD-10-CM | POA: Diagnosis not present

## 2022-11-07 DIAGNOSIS — L570 Actinic keratosis: Secondary | ICD-10-CM | POA: Diagnosis not present

## 2022-11-07 DIAGNOSIS — L0101 Non-bullous impetigo: Secondary | ICD-10-CM | POA: Diagnosis not present

## 2022-12-02 ENCOUNTER — Telehealth: Payer: Medicare Other | Admitting: Nurse Practitioner

## 2022-12-02 DIAGNOSIS — L03211 Cellulitis of face: Secondary | ICD-10-CM

## 2022-12-02 MED ORDER — SULFAMETHOXAZOLE-TRIMETHOPRIM 800-160 MG PO TABS
1.0000 | ORAL_TABLET | Freq: Two times a day (BID) | ORAL | 0 refills | Status: AC
Start: 2022-12-02 — End: 2022-12-09

## 2022-12-02 NOTE — Progress Notes (Signed)
I have spent 5 minutes in review of e-visit questionnaire, review and updating patient chart, medical decision making and response to patient.  ° °Beth Johnston W Secilia Apps, NP ° °  °

## 2022-12-02 NOTE — Progress Notes (Signed)
E Visit for Cellulitis ° °We are sorry that you are not feeling well. Here is how we plan to help! ° °Based on what you shared with me it looks like you have cellulitis.  Cellulitis looks like areas of skin redness, swelling, and warmth; it develops as a result of bacteria entering under the skin. Little red spots and/or bleeding can be seen in skin, and tiny surface sacs containing fluid can occur. Fever can be present. Cellulitis is almost always on one side of a body, and the lower limbs are the most common site of involvement.  ° °I have prescribed:  Bactrim DS 1 tablet by mouth twice a day for 7 days ° °HOME CARE: ° °Take your medications as ordered and take all of them, even if the skin irritation appears to be healing.  ° °GET HELP RIGHT AWAY IF: ° °Symptoms that don't begin to go away within 48 hours. °Severe redness persists or worsens °If the area turns color, spreads or swells. °If it blisters and opens, develops yellow-brown crust or bleeds. °You develop a fever or chills. °If the pain increases or becomes unbearable.  °Are unable to keep fluids and food down. ° °MAKE SURE YOU  ° °Understand these instructions. °Will watch your condition. °Will get help right away if you are not doing well or get worse. ° °Thank you for choosing an e-visit. ° °Your e-visit answers were reviewed by a board certified advanced clinical practitioner to complete your personal care plan. Depending upon the condition, your plan could have included both over the counter or prescription medications. ° °Please review your pharmacy choice. Make sure the pharmacy is open so you can pick up prescription now. If there is a problem, you may contact your provider through MyChart messaging and have the prescription routed to another pharmacy.  Your safety is important to us. If you have drug allergies check your prescription carefully.  ° °For the next 24 hours you can use MyChart to ask questions about today's visit, request a  non-urgent call back, or ask for a work or school excuse. °You will get an email in the next two days asking about your experience. I hope that your e-visit has been valuable and will speed your recovery. ° °

## 2022-12-12 ENCOUNTER — Encounter: Payer: Self-pay | Admitting: Orthopaedic Surgery

## 2022-12-12 ENCOUNTER — Ambulatory Visit: Payer: Medicare Other | Admitting: Orthopaedic Surgery

## 2022-12-12 DIAGNOSIS — M25511 Pain in right shoulder: Secondary | ICD-10-CM

## 2022-12-12 DIAGNOSIS — M25512 Pain in left shoulder: Secondary | ICD-10-CM

## 2022-12-12 DIAGNOSIS — G8929 Other chronic pain: Secondary | ICD-10-CM

## 2022-12-12 MED ORDER — METHYLPREDNISOLONE ACETATE 40 MG/ML IJ SUSP
40.0000 mg | INTRAMUSCULAR | Status: AC | PRN
Start: 2022-12-12 — End: 2022-12-12
  Administered 2022-12-12: 40 mg via INTRA_ARTICULAR

## 2022-12-12 MED ORDER — LIDOCAINE HCL 1 % IJ SOLN
3.0000 mL | INTRAMUSCULAR | Status: AC | PRN
Start: 2022-12-12 — End: 2022-12-12
  Administered 2022-12-12: 3 mL

## 2022-12-12 MED ORDER — METHYLPREDNISOLONE ACETATE 40 MG/ML IJ SUSP
40.0000 mg | INTRAMUSCULAR | Status: AC | PRN
  Administered 2022-12-12: 40 mg via INTRA_ARTICULAR

## 2022-12-12 NOTE — Progress Notes (Signed)
The patient is a 89 year old female who will be 89 in 2 weeks.  She has chronic bilateral shoulder rotator cuff arthropathy and comes in about every 3 months for steroid injections in her shoulders.  She would like to have those today.  It has been 3 months since her last injections.  She denies any acute change in her medical status.  She has family with her today.  Examination of both shoulder shows limited range of motion both shoulders but she can move them and she can reach slightly above her head.  I did place a steroid injection in both shoulder subacromial outlets which she tolerated well.  We can repeat these in 3 months.    Procedure Note  Patient: Beth Johnston             Date of Birth: 1925/11/21           MRN: [id number]             Visit Date: 12/12/2022  Procedures: Visit Diagnoses:  1. Chronic pain of both shoulders     Large Joint Inj: R subacromial bursa on 12/12/2022 4:04 PM Indications: pain and diagnostic evaluation Details: 22 G 1.5 in needle  Arthrogram: No  Medications: 3 mL lidocaine 1 %; 40 mg methylPREDNISolone acetate 40 MG/ML Outcome: tolerated well, no immediate complications Procedure, treatment alternatives, risks and benefits explained, specific risks discussed. Consent was given by the patient. Immediately prior to procedure a time out was called to verify the correct patient, procedure, equipment, support staff and site/side marked as required. Patient was prepped and draped in the usual sterile fashion.    Large Joint Inj: L subacromial bursa on 12/12/2022 4:05 PM Indications: pain and diagnostic evaluation Details: 22 G 1.5 in needle  Arthrogram: No  Medications: 3 mL lidocaine 1 %; 40 mg methylPREDNISolone acetate 40 MG/ML Outcome: tolerated well, no immediate complications Procedure, treatment alternatives, risks and benefits explained, specific risks discussed. Consent was given by the patient. Immediately prior to procedure a time out  was called to verify the correct patient, procedure, equipment, support staff and site/side marked as required. Patient was prepped and draped in the usual sterile fashion.

## 2023-01-11 ENCOUNTER — Telehealth: Payer: Medicare Other | Admitting: Physician Assistant

## 2023-01-11 DIAGNOSIS — J208 Acute bronchitis due to other specified organisms: Secondary | ICD-10-CM | POA: Diagnosis not present

## 2023-01-11 DIAGNOSIS — B9689 Other specified bacterial agents as the cause of diseases classified elsewhere: Secondary | ICD-10-CM

## 2023-01-11 MED ORDER — AZITHROMYCIN 250 MG PO TABS: ORAL_TABLET | ORAL | 0 refills | Status: AC

## 2023-01-11 MED ORDER — BENZONATATE 100 MG PO CAPS: 100.0000 mg | ORAL_CAPSULE | Freq: Three times a day (TID) | ORAL | 0 refills | Status: DC | PRN

## 2023-01-11 NOTE — Progress Notes (Signed)
E-Visit for Cough  We are sorry that you are not feeling well.  Here is how we plan to help!  Based on your presentation I believe you most likely have A cough due to bacteria.  When patients have a fever and a productive cough with a change in color or increased sputum production, we are concerned about bacterial bronchitis.  If left untreated it can progress to pneumonia.  If your symptoms do not improve with your treatment plan it is important that you contact your provider.   I have prescribed Azithromyin 250 mg: two tablets now and then one tablet daily for 4 additonal days    In addition you may use A non-prescription cough medication called Mucinex DM: take 2 tablets every 12 hours. and A prescription cough medication called Tessalon Perles 100mg . You may take 1-2 capsules every 8 hours as needed for your cough.  From your responses in the eVisit questionnaire you describe inflammation in the upper respiratory tract which is causing a significant cough.  This is commonly called Bronchitis and has four common causes:   Allergies Viral Infections Acid Reflux Bacterial Infection Allergies, viruses and acid reflux are treated by controlling symptoms or eliminating the cause. An example might be a cough caused by taking certain blood pressure medications. You stop the cough by changing the medication. Another example might be a cough caused by acid reflux. Controlling the reflux helps control the cough.     HOME CARE Only take medications as instructed by your medical team. Complete the entire course of an antibiotic. Drink plenty of fluids and get plenty of rest. Avoid close contacts especially the very young and the elderly Cover your mouth if you cough or cough into your sleeve. Always remember to wash your hands A steam or ultrasonic humidifier can help congestion.   GET HELP RIGHT AWAY IF: You develop worsening fever. You become short of breath You cough up blood. Your symptoms  persist after you have completed your treatment plan MAKE SURE YOU  Understand these instructions. Will watch your condition. Will get help right away if you are not doing well or get worse.    Thank you for choosing an e-visit.  Your e-visit answers were reviewed by a board certified advanced clinical practitioner to complete your personal care plan. Depending upon the condition, your plan could have included both over the counter or prescription medications.  Please review your pharmacy choice. Make sure the pharmacy is open so you can pick up prescription now. If there is a problem, you may contact your provider through [employer] and have the prescription routed to another pharmacy.  Your safety is important to [name]. If you have drug allergies check your prescription carefully.   For the next 24 hours you can use MyChart to ask questions about today's visit, request a non-urgent call back, or ask for a work or school excuse. You will get an email in the next two days asking about your experience. I hope that your e-visit has been valuable and will speed your recovery.  I have spent 5 minutes in review of e-visit questionnaire, review and updating patient chart, medical decision making and response to patient.   Margaretann Loveless, PA-C

## 2023-02-04 ENCOUNTER — Encounter: Payer: Self-pay | Admitting: Family Medicine

## 2023-02-04 ENCOUNTER — Telehealth (INDEPENDENT_AMBULATORY_CARE_PROVIDER_SITE_OTHER): Payer: Medicare Other | Admitting: Family Medicine

## 2023-02-04 DIAGNOSIS — J069 Acute upper respiratory infection, unspecified: Secondary | ICD-10-CM | POA: Diagnosis not present

## 2023-02-04 NOTE — Progress Notes (Signed)
 Established Patient Office Visit   Subjective:  Patient ID: Beth Johnston, female    DOB: Jun 10, 1925  Age: 89 y.o. MRN: [id number]  Chief Complaint  Patient presents with   Acute Visit    PT C/O of cough with phlegm; no body aches, fevers or chills are present. Patient competed antibiotics that was given on 01/11/2023     HPI Encounter Diagnoses  Name Primary?   Upper respiratory tract infection, unspecified type Yes   Ongoing cough productive of some phlegm without fevers chills or difficulty breathing.  Was prescribed Zithromax  1 week ago.  Completed course of therapy and above symptoms are persisting.  Over-the-counter medications have been adequate for cough.  She has not required use of her albuterol  inhaler.   Review of Systems  Constitutional: Negative.   HENT: Negative.    Eyes:  Negative for blurred vision, discharge and redness.  Respiratory:  Positive for cough and sputum production. Negative for shortness of breath and wheezing.   Cardiovascular: Negative.   Gastrointestinal:  Negative for abdominal pain.  Genitourinary: Negative.   Musculoskeletal: Negative.  Negative for myalgias.  Skin:  Negative for rash.  Neurological:  Negative for tingling, loss of consciousness and weakness.  Endo/Heme/Allergies:  Negative for polydipsia.     Current Outpatient Medications:    acetaminophen  (TYLENOL ) 650 MG CR tablet, Take 1,300 mg by mouth 2 (two) times daily., Disp: , Rfl:    amoxicillin  (AMOXIL ) 500 MG tablet, SMARTSIG:4 Tablet(s) By Mouth, Disp: , Rfl:    amoxicillin -clavulanate (AUGMENTIN ) 875-125 MG tablet, Take 1 tablet by mouth 2 (two) times daily., Disp: 20 tablet, Rfl: 0   atorvastatin  (LIPITOR) 40 MG tablet, TAKE ONE-HALF TABLET BY MOUTH  ONCE DAILY AT 6 PM, Disp: 50 tablet, Rfl: 2   benzonatate  (TESSALON ) 100 MG capsule, Take 1-2 capsules (100-200 mg total) by mouth 3 (three) times daily as needed., Disp: 30 capsule, Rfl: 0   carvedilol  (COREG ) 6.25 MG  tablet, TAKE 1 TABLET BY MOUTH TWICE  DAILY WITH MEALS, Disp: 200 tablet, Rfl: 2   clopidogrel  (PLAVIX ) 75 MG tablet, TAKE 1 TABLET BY MOUTH DAILY  WITH BREAKFAST, Disp: 100 tablet, Rfl: 2   furosemide  (LASIX ) 40 MG tablet, TAKE 1 TABLET BY MOUTH DAILY, Disp: 100 tablet, Rfl: 2   losartan  (COZAAR ) 25 MG tablet, TAKE 1 TABLET BY MOUTH DAILY, Disp: 100 tablet, Rfl: 2   mupirocin ointment (BACTROBAN) 2 %, Apply topically daily., Disp: , Rfl:    spironolactone  (ALDACTONE ) 25 MG tablet, TAKE ONE-HALF TABLET BY MOUTH  TWICE DAILY AT 8 AM AND 6 PM, Disp: 100 tablet, Rfl: 2   trimethoprim -polymyxin b  (POLYTRIM ) ophthalmic solution, Place 1 drop into both eyes every 4 (four) hours., Disp: 10 mL, Rfl: 0   Olopatadine  HCl 0.2 % SOLN, Apply 1 drop to eye daily. (Patient not taking: Reported on 08/10/2022), Disp: 2.5 mL, Rfl: 0   Objective:     There were no vitals taken for this visit.   Physical Exam Constitutional:      General: She is not in acute distress.    Appearance: Normal appearance. She is not ill-appearing, toxic-appearing or diaphoretic.  HENT:     Head: Normocephalic and atraumatic.     Right Ear: External ear normal.     Left Ear: External ear normal.  Eyes:     General: No scleral icterus.       Right eye: No discharge.        Left eye: No discharge.  Extraocular Movements: Extraocular movements intact.     Conjunctiva/sclera: Conjunctivae normal.  Pulmonary:     Effort: Pulmonary effort is normal. No respiratory distress.  Skin:    General: Skin is warm and dry.  Neurological:     Mental Status: She is alert and oriented to person, place, and time.  Psychiatric:        Mood and Affect: Mood normal.        Behavior: Behavior normal.      No results found for any visits on 02/04/23.    The ASCVD Risk score (Arnett DK, et al., 2019) failed to calculate for the following reasons:   The 2019 ASCVD risk score is only valid for ages 48 to 81   Risk score cannot be  calculated because patient has a medical history suggesting prior/existing ASCVD    Assessment & Plan:   Upper respiratory tract infection, unspecified type    Return Advised patient to come into the clinic for evaluation..  Her daughter said that they would think about it and let us  know.  Offered to send in cough medicine and they declined for now.  Virtual Visit via Video Note  I connected with Lizandra R Gelin on 02/04/23 at  2:20 PM EST by a video enabled telemedicine application and verified that I am speaking with the correct person using two identifiers.  Location: Patient: home with daughter.  Provider: work   I discussed the limitations of evaluation and management by telemedicine and the availability of in person appointments. The patient expressed understanding and agreed to proceed.  History of Present Illness:    Observations/Objective:   Assessment and Plan:   Follow Up Instructions:    I discussed the assessment and treatment plan with the patient. The patient was provided an opportunity to ask questions and all were answered. The patient agreed with the plan and demonstrated an understanding of the instructions.   The patient was advised to call back or seek an in-person evaluation if the symptoms worsen or if the condition fails to improve as anticipated.  I provided 20 minutes of non-face-to-face time during this encounter.   Elsie Sim Lent, MD   Elsie Sim Lent, MD

## 2023-02-05 ENCOUNTER — Encounter: Payer: Self-pay | Admitting: Family Medicine

## 2023-02-05 ENCOUNTER — Ambulatory Visit (INDEPENDENT_AMBULATORY_CARE_PROVIDER_SITE_OTHER): Payer: Medicare Other | Admitting: Family Medicine

## 2023-02-05 VITALS — BP 106/76 | HR 76 | Temp 97.7°F | Ht 65.0 in | Wt 160.0 lb

## 2023-02-05 DIAGNOSIS — J22 Unspecified acute lower respiratory infection: Secondary | ICD-10-CM | POA: Diagnosis not present

## 2023-02-05 DIAGNOSIS — J4520 Mild intermittent asthma, uncomplicated: Secondary | ICD-10-CM | POA: Diagnosis not present

## 2023-02-05 MED ORDER — AMOXICILLIN-POT CLAVULANATE 875-125 MG PO TABS: 1.0000 | ORAL_TABLET | Freq: Two times a day (BID) | ORAL | 0 refills | Status: DC

## 2023-02-05 MED ORDER — ALBUTEROL SULFATE (2.5 MG/3ML) 0.083% IN NEBU: 2.5000 mg | INHALATION_SOLUTION | Freq: Four times a day (QID) | RESPIRATORY_TRACT | 1 refills | Status: DC | PRN

## 2023-02-05 MED ORDER — PREDNISONE 10 MG PO TABS: 10.0000 mg | ORAL_TABLET | Freq: Two times a day (BID) | ORAL | 0 refills | Status: AC

## 2023-02-05 NOTE — Progress Notes (Signed)
 Established Patient Office Visit   Subjective:  Patient ID: Beth Johnston, female    DOB: [birth year]  Age: 89 y.o. MRN: [id number]  Chief Complaint  Patient presents with   Cough    Productive cough x 2 weeks.     Cough Associated symptoms include wheezing. Pertinent negatives include no chills, eye redness, fever, myalgias or rash.   Encounter Diagnoses  Name Primary?   Lower respiratory tract infection Yes   Mild intermittent reactive airway disease without complication    2-week history of cough occasionally productive of yellow phlegm.  Status post recent URI.  Upper respiratory tract symptoms that cleared but cough is persisting.  There has been no wheezing.  Patient has a history of asthma but has never smoked.  She was prescribed Zithromax  and completed therapy.   Review of Systems  Constitutional: Negative.  Negative for chills and fever.  HENT: Negative.    Eyes:  Negative for blurred vision, discharge and redness.  Respiratory:  Positive for cough, sputum production and wheezing.   Cardiovascular: Negative.   Gastrointestinal:  Negative for abdominal pain.  Genitourinary: Negative.   Musculoskeletal: Negative.  Negative for myalgias.  Skin:  Negative for rash.  Neurological:  Negative for tingling, loss of consciousness and weakness.  Endo/Heme/Allergies:  Negative for polydipsia.     Current Outpatient Medications:    acetaminophen  (TYLENOL ) 650 MG CR tablet, Take 1,300 mg by mouth 2 (two) times daily., Disp: , Rfl:    albuterol  (PROVENTIL ) (2.5 MG/3ML) 0.083% nebulizer solution, Take 3 mLs (2.5 mg total) by nebulization every 6 (six) hours as needed for wheezing or shortness of breath., Disp: 150 mL, Rfl: 1   amoxicillin -clavulanate (AUGMENTIN ) 875-125 MG tablet, Take 1 tablet by mouth 2 (two) times daily., Disp: 20 tablet, Rfl: 0   atorvastatin  (LIPITOR) 40 MG tablet, TAKE ONE-HALF TABLET BY MOUTH  ONCE DAILY AT 6 PM, Disp: 50 tablet, Rfl: 2   carvedilol   (COREG ) 6.25 MG tablet, TAKE 1 TABLET BY MOUTH TWICE  DAILY WITH MEALS, Disp: 200 tablet, Rfl: 2   clopidogrel  (PLAVIX ) 75 MG tablet, TAKE 1 TABLET BY MOUTH DAILY  WITH BREAKFAST, Disp: 100 tablet, Rfl: 2   furosemide  (LASIX ) 40 MG tablet, TAKE 1 TABLET BY MOUTH DAILY, Disp: 100 tablet, Rfl: 2   losartan  (COZAAR ) 25 MG tablet, TAKE 1 TABLET BY MOUTH DAILY, Disp: 100 tablet, Rfl: 2   mupirocin ointment (BACTROBAN) 2 %, Apply topically daily., Disp: , Rfl:    predniSONE  (DELTASONE ) 10 MG tablet, Take 1 tablet (10 mg total) by mouth 2 (two) times daily with a meal for 7 days., Disp: 14 tablet, Rfl: 0   spironolactone  (ALDACTONE ) 25 MG tablet, TAKE ONE-HALF TABLET BY MOUTH  TWICE DAILY AT 8 AM AND 6 PM, Disp: 100 tablet, Rfl: 2   trimethoprim -polymyxin b  (POLYTRIM ) ophthalmic solution, Place 1 drop into both eyes every 4 (four) hours., Disp: 10 mL, Rfl: 0   amoxicillin  (AMOXIL ) 500 MG tablet, SMARTSIG:4 Tablet(s) By Mouth (Patient not taking: Reported on 02/05/2023), Disp: , Rfl:    benzonatate  (TESSALON ) 100 MG capsule, Take 1-2 capsules (100-200 mg total) by mouth 3 (three) times daily as needed. (Patient not taking: Reported on 02/05/2023), Disp: 30 capsule, Rfl: 0   Olopatadine  HCl 0.2 % SOLN, Apply 1 drop to eye daily. (Patient not taking: Reported on 08/10/2022), Disp: 2.5 mL, Rfl: 0   Objective:     BP 106/76 (Cuff Size: Normal)   Pulse 76   Temp  97.7 F (36.5 C)   Ht 5' 5 (1.651 m)   Wt 160 lb (72.6 kg)   SpO2 100%   BMI 26.63 kg/m    Physical Exam Constitutional:      General: She is not in acute distress.    Appearance: Normal appearance. She is not ill-appearing, toxic-appearing or diaphoretic.  HENT:     Head: Normocephalic and atraumatic.     Right Ear: External ear normal.     Left Ear: External ear normal.     Mouth/Throat:     Mouth: Mucous membranes are moist.     Pharynx: Oropharynx is clear. No oropharyngeal exudate or posterior oropharyngeal erythema.  Eyes:      General: No scleral icterus.       Right eye: No discharge.        Left eye: No discharge.     Extraocular Movements: Extraocular movements intact.     Conjunctiva/sclera: Conjunctivae normal.     Pupils: Pupils are equal, round, and reactive to light.  Cardiovascular:     Rate and Rhythm: Normal rate and regular rhythm.  Pulmonary:     Effort: Pulmonary effort is normal. No respiratory distress.     Breath sounds: Wheezing present. No rales.  Abdominal:     General: Bowel sounds are normal.     Tenderness: There is no abdominal tenderness. There is no guarding.  Musculoskeletal:     Cervical back: No rigidity or tenderness.  Skin:    General: Skin is warm and dry.  Neurological:     Mental Status: She is alert and oriented to person, place, and time.  Psychiatric:        Mood and Affect: Mood normal.        Behavior: Behavior normal.      No results found for any visits on 02/05/23.    The ASCVD Risk score (Arnett DK, et al., 2019) failed to calculate for the following reasons:   The 2019 ASCVD risk score is only valid for ages 31 to 21   Risk score cannot be calculated because patient has a medical history suggesting prior/existing ASCVD    Assessment & Plan:   Lower respiratory tract infection -     Amoxicillin -Pot Clavulanate; Take 1 tablet by mouth 2 (two) times daily.  Dispense: 20 tablet; Refill: 0 -     predniSONE ; Take 1 tablet (10 mg total) by mouth 2 (two) times daily with a meal for 7 days.  Dispense: 14 tablet; Refill: 0  Mild intermittent reactive airway disease without complication -     For home use only DME Nebulizer machine -     Albuterol  Sulfate; Take 3 mLs (2.5 mg total) by nebulization every 6 (six) hours as needed for wheezing or shortness of breath.  Dispense: 150 mL; Refill: 1 -     predniSONE ; Take 1 tablet (10 mg total) by mouth 2 (two) times daily with a meal for 7 days.  Dispense: 14 tablet; Refill: 0    Return if symptoms worsen or fail  to improve.    Elsie Sim Lent, MD

## 2023-02-06 ENCOUNTER — Encounter: Payer: Self-pay | Admitting: Family Medicine

## 2023-02-06 DIAGNOSIS — J4520 Mild intermittent asthma, uncomplicated: Secondary | ICD-10-CM

## 2023-02-11 MED ORDER — ALBUTEROL SULFATE HFA 108 (90 BASE) MCG/ACT IN AERS: 1.0000 | INHALATION_SPRAY | Freq: Four times a day (QID) | RESPIRATORY_TRACT | 0 refills | Status: DC | PRN

## 2023-02-25 ENCOUNTER — Other Ambulatory Visit: Payer: Self-pay | Admitting: Cardiovascular Disease

## 2023-02-26 ENCOUNTER — Other Ambulatory Visit: Payer: Self-pay | Admitting: Cardiovascular Disease

## 2023-02-26 ENCOUNTER — Ambulatory Visit (INDEPENDENT_AMBULATORY_CARE_PROVIDER_SITE_OTHER): Payer: Medicare Other | Admitting: Family Medicine

## 2023-02-26 VITALS — BP 104/58 | HR 69 | Temp 98.3°F | Ht 65.0 in | Wt 158.2 lb

## 2023-02-26 DIAGNOSIS — J189 Pneumonia, unspecified organism: Secondary | ICD-10-CM | POA: Diagnosis not present

## 2023-02-26 MED ORDER — OFLOXACIN 400 MG PO TABS: 400.0000 mg | ORAL_TABLET | Freq: Two times a day (BID) | ORAL | 0 refills | Status: DC

## 2023-02-26 MED ORDER — BENZONATATE 200 MG PO CAPS: 200.0000 mg | ORAL_CAPSULE | Freq: Two times a day (BID) | ORAL | 0 refills | Status: AC | PRN

## 2023-02-26 NOTE — Addendum Note (Signed)
Addended by: Andrez Grime on: 02/26/2023 01:38 PM   Modules accepted: Orders

## 2023-02-26 NOTE — Progress Notes (Addendum)
Her left knee we will hold off on steroids and I will send in a cough pill and it is okay to do the breathing treatments only when she is wheezing  Established Patient Office Visit   Subjective:  Patient ID: Beth Johnston, female    DOB: October 24, 1925  Age: 89 y.o. MRN: [id number]  Chief Complaint  Patient presents with   Cough    Dry cough for a few weeks    Cough Pertinent negatives include no eye redness, myalgias, rash or shortness of breath.   Encounter Diagnoses  Name Primary?   Pneumonia of right lower lobe due to infectious organism Yes   For follow-up of lower URI seen by me back on the seventh of this month.  Cough persist productive of yellow phlegm.  There has been some mild wheezing.  Using her inhaler sparingly.  There is been no fever or chills.  No recognized postnasal drip.  Completed therapy with Augmentin.   Review of Systems  Constitutional: Negative.   HENT: Negative.    Eyes:  Negative for blurred vision, discharge and redness.  Respiratory:  Positive for cough and sputum production. Negative for shortness of breath.   Cardiovascular: Negative.   Gastrointestinal:  Negative for abdominal pain.  Genitourinary: Negative.   Musculoskeletal: Negative.  Negative for myalgias.  Skin:  Negative for rash.  Neurological:  Negative for tingling, loss of consciousness and weakness.  Endo/Heme/Allergies:  Negative for polydipsia.     Current Outpatient Medications:    acetaminophen (TYLENOL) 650 MG CR tablet, Take 1,300 mg by mouth 2 (two) times daily., Disp: , Rfl:    albuterol (PROVENTIL) (2.5 MG/3ML) 0.083% nebulizer solution, Take 3 mLs (2.5 mg total) by nebulization every 6 (six) hours as needed for wheezing or shortness of breath., Disp: 150 mL, Rfl: 1   albuterol (VENTOLIN HFA) 108 (90 Base) MCG/ACT inhaler, Inhale 1-2 puffs into the lungs every 6 (six) hours as needed for wheezing or shortness of breath., Disp: 6.7 g, Rfl: 0   atorvastatin (LIPITOR) 40 MG  tablet, TAKE ONE-HALF TABLET BY MOUTH  ONCE DAILY AT 6 PM, Disp: 50 tablet, Rfl: 2   benzonatate (TESSALON) 200 MG capsule, Take 1 capsule (200 mg total) by mouth 2 (two) times daily as needed for cough., Disp: 20 capsule, Rfl: 0   carvedilol (COREG) 6.25 MG tablet, TAKE 1 TABLET BY MOUTH TWICE  DAILY WITH MEALS, Disp: 200 tablet, Rfl: 2   clopidogrel (PLAVIX) 75 MG tablet, TAKE 1 TABLET BY MOUTH DAILY  WITH BREAKFAST, Disp: 100 tablet, Rfl: 2   furosemide (LASIX) 40 MG tablet, TAKE 1 TABLET BY MOUTH DAILY, Disp: 100 tablet, Rfl: 2   losartan (COZAAR) 25 MG tablet, TAKE 1 TABLET BY MOUTH DAILY, Disp: 100 tablet, Rfl: 2   mupirocin ointment (BACTROBAN) 2 %, Apply topically daily., Disp: , Rfl:    ofloxacin (FLOXIN) 400 MG tablet, Take 1 tablet (400 mg total) by mouth 2 (two) times daily for 5 days., Disp: 10 tablet, Rfl: 0   spironolactone (ALDACTONE) 25 MG tablet, TAKE ONE-HALF TABLET BY MOUTH  TWICE DAILY AT 8 AM AND 6 PM, Disp: 100 tablet, Rfl: 2   trimethoprim-polymyxin b (POLYTRIM) ophthalmic solution, Place 1 drop into both eyes every 4 (four) hours., Disp: 10 mL, Rfl: 0   amoxicillin-clavulanate (AUGMENTIN) 875-125 MG tablet, Take 1 tablet by mouth 2 (two) times daily. (Patient not taking: Reported on 02/26/2023), Disp: 20 tablet, Rfl: 0   Objective:     BP Marland Kitchen)  104/58 (BP Location: Left Arm, Patient Position: Sitting, Cuff Size: Normal)   Pulse 69   Temp 98.3 F (36.8 C)   Ht 5\' 5"  (1.651 m)   Wt 158 lb 3.2 oz (71.8 kg)   SpO2 97%   BMI 26.33 kg/m  BP Readings from Last 3 Encounters:  02/26/23 (!) 104/58  02/05/23 106/76  06/12/22 104/66   Wt Readings from Last 3 Encounters:  02/26/23 158 lb 3.2 oz (71.8 kg)  02/05/23 160 lb (72.6 kg)  08/10/22 155 lb (70.3 kg)      Physical Exam Constitutional:      General: She is not in acute distress.    Appearance: Normal appearance. She is not ill-appearing, toxic-appearing or diaphoretic.  HENT:     Head: Normocephalic and  atraumatic.     Right Ear: External ear normal.     Left Ear: External ear normal.     Mouth/Throat:     Mouth: Mucous membranes are moist.     Pharynx: Oropharynx is clear. No oropharyngeal exudate or posterior oropharyngeal erythema.  Eyes:     General: No scleral icterus.       Right eye: No discharge.        Left eye: No discharge.     Extraocular Movements: Extraocular movements intact.     Conjunctiva/sclera: Conjunctivae normal.     Pupils: Pupils are equal, round, and reactive to light.  Cardiovascular:     Rate and Rhythm: Normal rate and regular rhythm.  Pulmonary:     Effort: Pulmonary effort is normal. No respiratory distress.     Breath sounds: Examination of the right-lower field reveals rales. Rales present. No wheezing or rhonchi.  Abdominal:     General: Bowel sounds are normal.     Tenderness: There is no abdominal tenderness. There is no guarding.  Musculoskeletal:     Cervical back: No rigidity or tenderness.  Skin:    General: Skin is warm and dry.  Neurological:     Mental Status: She is alert and oriented to person, place, and time.  Psychiatric:        Mood and Affect: Mood normal.        Behavior: Behavior normal.      No results found for any visits on 02/26/23.    The ASCVD Risk score (Arnett DK, et al., 2019) failed to calculate for the following reasons:   The 2019 ASCVD risk score is only valid for ages 64 to 49   Risk score cannot be calculated because patient has a medical history suggesting prior/existing ASCVD    Assessment & Plan:   Pneumonia of right lower lobe due to infectious organism -     Ofloxacin; Take 1 tablet (400 mg total) by mouth 2 (two) times daily for 5 days.  Dispense: 10 tablet; Refill: 0 -     Benzonatate; Take 1 capsule (200 mg total) by mouth 2 (two) times daily as needed for cough.  Dispense: 20 capsule; Refill: 0    Return if symptoms worsen or fail to improve.  Ordered chest x-ray but daughter and patient  would like to avoid that.  Use neb treatments every 6 hours as needed for wheezing.  Tessalon as needed for cough.  Mliss Sax, MD

## 2023-02-27 ENCOUNTER — Telehealth: Payer: Self-pay | Admitting: Family Medicine

## 2023-02-27 ENCOUNTER — Other Ambulatory Visit: Payer: Self-pay | Admitting: Family Medicine

## 2023-02-27 DIAGNOSIS — J189 Pneumonia, unspecified organism: Secondary | ICD-10-CM

## 2023-02-27 NOTE — Telephone Encounter (Signed)
Copied from CRM [id number]. Topic: Clinical - Prescription Issue >> Feb 27, 2023  3:25 PM Beth Johnston A wrote: Reason for CRM: Patient called in and wanted to know if there was a generic brand medication for ofloxacin (FLOXIN) 400 MG tablet so that she will not have to pay $100 for medication. Patient is also asking if the prescription could be sent to [facility]. [place] PH: [phone] or [phone]. Patient has coupon for medication discount with [facility]. Please contact patient daughter Beth Johnston with any questions.

## 2023-02-27 NOTE — Telephone Encounter (Signed)
Spoke to Mia from [facility] and answered questions and concerns regarding RX. Patient will needed to take for 5 days LOV 02/26/2023 (cough) was noted.

## 2023-02-27 NOTE — Telephone Encounter (Signed)
Mia from [facility] pharmacy is calling into verify that dr wants patient to take ofloxacin (FLOXIN) 400 MG tablet due to her age before she fill's patient prescription

## 2023-02-28 ENCOUNTER — Encounter: Payer: Self-pay | Admitting: Family Medicine

## 2023-02-28 MED ORDER — MOXIFLOXACIN HCL 400 MG PO TABS: 400.0000 mg | ORAL_TABLET | Freq: Every day | ORAL | 0 refills | Status: AC

## 2023-02-28 NOTE — Telephone Encounter (Signed)
Copied from CRM [id number]. Topic: Clinical - Prescription Issue >> Feb 28, 2023 10:17 AM Denese Killings wrote: Reason for CRM: Patient daughter had it transfer to [facility] but they can't fill it. Patient daughter wants to know if a different medication can be called in. She wants to know what should she do and if patient has to take particular medication she will pay for it. She is requesting a callback.

## 2023-02-28 NOTE — Addendum Note (Signed)
Addended by: Andrez Grime on: 02/28/2023 11:49 AM   Modules accepted: Orders

## 2023-03-14 ENCOUNTER — Ambulatory Visit: Payer: Medicare Other | Admitting: Orthopaedic Surgery

## 2023-03-14 ENCOUNTER — Encounter: Payer: Self-pay | Admitting: Orthopaedic Surgery

## 2023-03-14 DIAGNOSIS — G8929 Other chronic pain: Secondary | ICD-10-CM

## 2023-03-14 DIAGNOSIS — M25512 Pain in left shoulder: Secondary | ICD-10-CM | POA: Diagnosis not present

## 2023-03-14 DIAGNOSIS — M25511 Pain in right shoulder: Secondary | ICD-10-CM

## 2023-03-14 MED ORDER — LIDOCAINE HCL 1 % IJ SOLN
3.0000 mL | INTRAMUSCULAR | Status: AC | PRN
  Administered 2023-03-14: 3 mL

## 2023-03-14 MED ORDER — METHYLPREDNISOLONE ACETATE 40 MG/ML IJ SUSP
40.0000 mg | INTRAMUSCULAR | Status: AC | PRN
  Administered 2023-03-14: 40 mg via INTRA_ARTICULAR

## 2023-03-14 NOTE — Progress Notes (Signed)
The patient is very well-known to me.  She is now 89.  She comes in with her daughter today for steroid injections in both shoulders.  She does have rotator cuff arthropathy in both shoulders and comes in about every 3 months for steroid injections.  She did have a recent fall injuring her rib.  She does not repeat steroid injections today.  She is not a diabetic.  Both shoulders have limited range of motion and are painful throughout the arc of motion.  Shoulders are weak.  I did place a steroid injection in both shoulder subacromial outlets and she tolerated these well today.  We can repeat this in 3 months.    Procedure Note  Patient: Beth Johnston             Date of Birth: 1926-01-05           MRN: [id number]             Visit Date: 03/14/2023  Procedures: Visit Diagnoses:  1. Chronic pain of both shoulders     Large Joint Inj: R subacromial bursa on 03/14/2023 3:34 PM Indications: pain and diagnostic evaluation Details: 22 G 1.5 in needle  Arthrogram: No  Medications: 3 mL lidocaine 1 %; 40 mg methylPREDNISolone acetate 40 MG/ML Outcome: tolerated well, no immediate complications Procedure, treatment alternatives, risks and benefits explained, specific risks discussed. Consent was given by the patient. Immediately prior to procedure a time out was called to verify the correct patient, procedure, equipment, support staff and site/side marked as required. Patient was prepped and draped in the usual sterile fashion.    Large Joint Inj: L subacromial bursa on 03/14/2023 3:34 PM Indications: pain and diagnostic evaluation Details: 22 G 1.5 in needle  Arthrogram: No  Medications: 3 mL lidocaine 1 %; 40 mg methylPREDNISolone acetate 40 MG/ML Outcome: tolerated well, no immediate complications Procedure, treatment alternatives, risks and benefits explained, specific risks discussed. Consent was given by the patient. Immediately prior to procedure a time out was called to verify  the correct patient, procedure, equipment, support staff and site/side marked as required. Patient was prepped and draped in the usual sterile fashion.

## 2023-03-17 ENCOUNTER — Other Ambulatory Visit: Payer: Self-pay | Admitting: Family Medicine

## 2023-03-17 DIAGNOSIS — J4520 Mild intermittent asthma, uncomplicated: Secondary | ICD-10-CM

## 2023-04-11 ENCOUNTER — Other Ambulatory Visit: Payer: Self-pay | Admitting: Cardiovascular Disease

## 2023-05-08 ENCOUNTER — Other Ambulatory Visit: Payer: Self-pay | Admitting: Cardiovascular Disease

## 2023-05-15 ENCOUNTER — Other Ambulatory Visit: Payer: Self-pay | Admitting: Cardiovascular Disease

## 2023-05-27 ENCOUNTER — Other Ambulatory Visit: Payer: Self-pay | Admitting: Cardiovascular Disease

## 2023-05-30 ENCOUNTER — Encounter: Payer: Self-pay | Admitting: Cardiovascular Disease

## 2023-06-03 ENCOUNTER — Other Ambulatory Visit: Payer: Self-pay | Admitting: Family Medicine

## 2023-06-03 DIAGNOSIS — J4520 Mild intermittent asthma, uncomplicated: Secondary | ICD-10-CM

## 2023-06-06 ENCOUNTER — Telehealth: Admitting: Nurse Practitioner

## 2023-06-06 DIAGNOSIS — J40 Bronchitis, not specified as acute or chronic: Secondary | ICD-10-CM

## 2023-06-06 MED ORDER — BENZONATATE 100 MG PO CAPS
100.0000 mg | ORAL_CAPSULE | Freq: Three times a day (TID) | ORAL | 0 refills | Status: AC | PRN
Start: 2023-06-06 — End: ?

## 2023-06-06 MED ORDER — ALBUTEROL SULFATE HFA 108 (90 BASE) MCG/ACT IN AERS: 2.0000 | INHALATION_SPRAY | Freq: Four times a day (QID) | RESPIRATORY_TRACT | 0 refills | Status: AC | PRN

## 2023-06-06 MED ORDER — PREDNISONE 20 MG PO TABS: 20.0000 mg | ORAL_TABLET | Freq: Every day | ORAL | 0 refills | Status: DC

## 2023-06-06 MED ORDER — AMOXICILLIN 500 MG PO CAPS: 1000.0000 mg | ORAL_CAPSULE | Freq: Three times a day (TID) | ORAL | 0 refills | Status: AC

## 2023-06-06 NOTE — Progress Notes (Signed)
 E-Visit for Cough   We are sorry that you are not feeling well.  Here is how we plan to help!  Bronchitis is typically caused by a virus. You may not need an antibiotic yet. I would suggest using the cough medicine prescribed, and assure you are using your Albuterol  inhaler every 4-6 hours. If you need a refill of that please let us  know. We will add prednisone  to help with the cough, once daily for the next 5 days.  I will call in the Amoxicillin , but I think you can hold off until you see your doctor tomorrow before starting that- as you may not need it at all.   All the best!   Based on your presentation I believe you most likely have A cough due to a virus.  This is called viral bronchitis and is best treated by rest, plenty of fluids and control of the cough.  You may use Ibuprofen  or Tylenol  as directed to help your symptoms.     In addition you may use A prescription cough medication called Tessalon  Perles 100mg . You may take 1-2 capsules every 8 hours as needed for your cough.  Prednisone  20 mg daily for 5 days  Meds ordered this encounter  Medications   predniSONE  (DELTASONE ) 20 MG tablet    Sig: Take 1 tablet (20 mg total) by mouth daily with breakfast for 5 days.    Dispense:  5 tablet    Refill:  0   amoxicillin  (AMOXIL ) 500 MG capsule    Sig: Take 2 capsules (1,000 mg total) by mouth 3 (three) times daily for 7 days.    Dispense:  42 capsule    Refill:  0   benzonatate  (TESSALON ) 100 MG capsule    Sig: Take 1 capsule (100 mg total) by mouth 3 (three) times daily as needed.    Dispense:  30 capsule    Refill:  0   albuterol  (VENTOLIN  HFA) 108 (90 Base) MCG/ACT inhaler    Sig: Inhale 2 puffs into the lungs every 6 (six) hours as needed for wheezing or shortness of breath.    Dispense:  8 g    Refill:  0     From your responses in the eVisit questionnaire you describe inflammation in the upper respiratory tract which is causing a significant cough.  This is commonly  called Bronchitis and has four common causes:   Allergies Viral Infections Acid Reflux Bacterial Infection Allergies, viruses and acid reflux are treated by controlling symptoms or eliminating the cause. An example might be a cough caused by taking certain blood pressure medications. You stop the cough by changing the medication. Another example might be a cough caused by acid reflux. Controlling the reflux helps control the cough.  USE OF BRONCHODILATOR ("RESCUE") INHALERS: There is a risk from using your bronchodilator too frequently.  The risk is that over-reliance on a medication which only relaxes the muscles surrounding the breathing tubes can reduce the effectiveness of medications prescribed to reduce swelling and congestion of the tubes themselves.  Although you feel brief relief from the bronchodilator inhaler, your asthma may actually be worsening with the tubes becoming more swollen and filled with mucus.  This can delay other crucial treatments, such as oral steroid medications. If you need to use a bronchodilator inhaler daily, several times per day, you should discuss this with your provider.  There are probably better treatments that could be used to keep your asthma under control.  HOME CARE Only take medications as instructed by your medical team. Complete the entire course of an antibiotic. Drink plenty of fluids and get plenty of rest. Avoid close contacts especially the very young and the elderly Cover your mouth if you cough or cough into your sleeve. Always remember to wash your hands A steam or ultrasonic humidifier can help congestion.   GET HELP RIGHT AWAY IF: You develop worsening fever. You become short of breath You cough up blood. Your symptoms persist after you have completed your treatment plan MAKE SURE YOU  Understand these instructions. Will watch your condition. Will get help right away if you are not doing well or get worse.    Thank you for  choosing an e-visit.  Your e-visit answers were reviewed by a board certified advanced clinical practitioner to complete your personal care plan. Depending upon the condition, your plan could have included both over the counter or prescription medications.  Please review your pharmacy choice. Make sure the pharmacy is open so you can pick up prescription now. If there is a problem, you may contact your provider through [employer] and have the prescription routed to another pharmacy.  Your safety is important to us . If you have drug allergies check your prescription carefully.   For the next 24 hours you can use MyChart to ask questions about today's visit, request a non-urgent call back, or ask for a work or school excuse. You will get an email in the next two days asking about your experience. I hope that your e-visit has been valuable and will speed your recovery.  I spent approximately 5 minutes reviewing the patient's history, current symptoms and coordinating their care today.

## 2023-06-07 ENCOUNTER — Ambulatory Visit (INDEPENDENT_AMBULATORY_CARE_PROVIDER_SITE_OTHER): Admitting: Family Medicine

## 2023-06-07 ENCOUNTER — Encounter: Payer: Self-pay | Admitting: Family Medicine

## 2023-06-07 VITALS — BP 98/66 | HR 73 | Temp 98.2°F | Ht 65.0 in | Wt 163.0 lb

## 2023-06-07 DIAGNOSIS — J4521 Mild intermittent asthma with (acute) exacerbation: Secondary | ICD-10-CM | POA: Diagnosis not present

## 2023-06-07 DIAGNOSIS — J22 Unspecified acute lower respiratory infection: Secondary | ICD-10-CM

## 2023-06-07 MED ORDER — ALBUTEROL SULFATE (2.5 MG/3ML) 0.083% IN NEBU: 2.5000 mg | INHALATION_SOLUTION | Freq: Four times a day (QID) | RESPIRATORY_TRACT | 1 refills | Status: AC | PRN

## 2023-06-07 MED ORDER — PREDNISONE 20 MG PO TABS
20.0000 mg | ORAL_TABLET | Freq: Two times a day (BID) | ORAL | 0 refills | Status: AC
Start: 2023-06-07 — End: 2023-06-14

## 2023-06-07 MED ORDER — DOXYCYCLINE HYCLATE 100 MG PO TABS: 100.0000 mg | ORAL_TABLET | Freq: Two times a day (BID) | ORAL | 0 refills | Status: AC

## 2023-06-07 MED ORDER — METHYLPREDNISOLONE SODIUM SUCC 125 MG IJ SOLR
125.0000 mg | Freq: Once | INTRAMUSCULAR | Status: AC
  Administered 2023-06-07: 125 mg via INTRAMUSCULAR

## 2023-06-07 NOTE — Progress Notes (Signed)
 Established Patient Office Visit   Subjective:  Patient ID: Beth Johnston, female    DOB: 07/14/1925  Age: 89 y.o. MRN: [id number]  Chief Complaint  Patient presents with   Cough    Productive cough (yellow) x 4 days. No other symptoms.     Cough Associated symptoms include wheezing. Pertinent negatives include no chills, eye redness, fever, myalgias or rash.   Encounter Diagnoses  Name Primary?   Lower respiratory tract infection Yes   Mild intermittent reactive airway disease with acute exacerbation    Presents with a 4-day history of unrelenting cough productive of yellow phlegm with wheezing and tightness in the chest.  She is accompanied by her daughter, Beth Johnston, who has been administering neb treatments.  There have been no fevers or chills.   Review of Systems  Constitutional: Negative.  Negative for chills and fever.  HENT: Negative.    Eyes:  Negative for blurred vision, discharge and redness.  Respiratory:  Positive for cough, sputum production and wheezing.   Cardiovascular: Negative.   Gastrointestinal:  Negative for abdominal pain.  Genitourinary: Negative.   Musculoskeletal: Negative.  Negative for myalgias.  Skin:  Negative for rash.  Neurological:  Negative for tingling, loss of consciousness and weakness.  Endo/Heme/Allergies:  Negative for polydipsia.     Current Outpatient Medications:    acetaminophen  (TYLENOL ) 650 MG CR tablet, Take 1,300 mg by mouth 2 (two) times daily., Disp: , Rfl:    albuterol  (VENTOLIN  HFA) 108 (90 Base) MCG/ACT inhaler, INHALE 1-2 PUFFS BY MOUTH EVERY 6 HOURS AS NEEDED FOR WHEEZE OR SHORTNESS OF BREATH, Disp: 25.5 each, Rfl: 1   albuterol  (VENTOLIN  HFA) 108 (90 Base) MCG/ACT inhaler, Inhale 2 puffs into the lungs every 6 (six) hours as needed for wheezing or shortness of breath., Disp: 8 g, Rfl: 0   amoxicillin  (AMOXIL ) 500 MG capsule, Take 2 capsules (1,000 mg total) by mouth 3 (three) times daily for 7 days., Disp: 42 capsule,  Rfl: 0   atorvastatin  (LIPITOR) 40 MG tablet, TAKE ONE-HALF TABLET BY MOUTH  DAILY, Disp: 15 tablet, Rfl: 0   carvedilol  (COREG ) 6.25 MG tablet, TAKE 1 TABLET BY MOUTH TWICE  DAILY WITH MEALS, Disp: 60 tablet, Rfl: 0   clopidogrel  (PLAVIX ) 75 MG tablet, TAKE 1 TABLET BY MOUTH DAILY  WITH BREAKFAST, Disp: 30 tablet, Rfl: 0   doxycycline  (VIBRA -TABS) 100 MG tablet, Take 1 tablet (100 mg total) by mouth 2 (two) times daily for 10 days., Disp: 20 tablet, Rfl: 0   furosemide  (LASIX ) 40 MG tablet, TAKE 1 TABLET BY MOUTH DAILY, Disp: 30 tablet, Rfl: 0   losartan  (COZAAR ) 25 MG tablet, TAKE 1 TABLET BY MOUTH DAILY, Disp: 30 tablet, Rfl: 0   mupirocin ointment (BACTROBAN) 2 %, Apply topically daily., Disp: , Rfl:    predniSONE  (DELTASONE ) 20 MG tablet, Take 1 tablet (20 mg total) by mouth 2 (two) times daily with a meal for 7 days., Disp: 14 tablet, Rfl: 0   spironolactone  (ALDACTONE ) 25 MG tablet, Take 0.5 tablets (12.5 mg total) by mouth 2 (two) times daily. TAKE ONE-HALF TABLET BY  MOUTH TWICE DAILY AT 8 AM  AND 6 PM, Disp: 30 tablet, Rfl: 0   trimethoprim -polymyxin b  (POLYTRIM ) ophthalmic solution, Place 1 drop into both eyes every 4 (four) hours., Disp: 10 mL, Rfl: 0   albuterol  (PROVENTIL ) (2.5 MG/3ML) 0.083% nebulizer solution, Take 3 mLs (2.5 mg total) by nebulization every 6 (six) hours as needed for wheezing or shortness of  breath., Disp: 150 mL, Rfl: 1   benzonatate  (TESSALON ) 100 MG capsule, Take 1 capsule (100 mg total) by mouth 3 (three) times daily as needed., Disp: 30 capsule, Rfl: 0   benzonatate  (TESSALON ) 200 MG capsule, Take 1 capsule (200 mg total) by mouth 2 (two) times daily as needed for cough., Disp: 20 capsule, Rfl: 0  Current Facility-Administered Medications:    methylPREDNISolone  sodium succinate (SOLU-MEDROL ) 125 mg/2 mL injection 125 mg, 125 mg, Intramuscular, Once,    Objective:     BP 98/66 (BP Location: Left Arm, Patient Position: Sitting, Cuff Size: Normal)   Pulse 73    Temp 98.2 F (36.8 C) (Temporal)   Ht 5\' 5"  (1.651 m)   Wt 163 lb (73.9 kg)   SpO2 95%   BMI 27.12 kg/m    Physical Exam Constitutional:      General: She is not in acute distress.    Appearance: Normal appearance. She is not ill-appearing, toxic-appearing or diaphoretic.  HENT:     Head: Normocephalic and atraumatic.     Right Ear: External ear normal.     Left Ear: External ear normal.  Eyes:     General: No scleral icterus.       Right eye: No discharge.        Left eye: No discharge.     Extraocular Movements: Extraocular movements intact.     Conjunctiva/sclera: Conjunctivae normal.  Pulmonary:     Effort: Pulmonary effort is normal. No respiratory distress.     Breath sounds: Decreased air movement present. Wheezing and rhonchi present. No rales.  Skin:    General: Skin is warm and dry.  Neurological:     Mental Status: She is alert and oriented to person, place, and time.  Psychiatric:        Mood and Affect: Mood normal.        Behavior: Behavior normal.      No results found for any visits on 06/07/23.    The ASCVD Risk score (Arnett DK, et al., 2019) failed to calculate for the following reasons:   The 2019 ASCVD risk score is only valid for ages 86 to 87   Risk score cannot be calculated because patient has a medical history suggesting prior/existing ASCVD    Assessment & Plan:   Lower respiratory tract infection -     Doxycycline  Hyclate; Take 1 tablet (100 mg total) by mouth 2 (two) times daily for 10 days.  Dispense: 20 tablet; Refill: 0  Mild intermittent reactive airway disease with acute exacerbation -     Albuterol  Sulfate; Take 3 mLs (2.5 mg total) by nebulization every 6 (six) hours as needed for wheezing or shortness of breath.  Dispense: 150 mL; Refill: 1 -     predniSONE ; Take 1 tablet (20 mg total) by mouth 2 (two) times daily with a meal for 7 days.  Dispense: 14 tablet; Refill: 0 -     methylPREDNISolone  Sodium Succ    No  follow-ups on file.  Solu-Medrol  125 x 1.  Start prednisone  this afternoon.  Start doxycycline  this morning.  Do not start amoxicillin .  Use nebulizer treatments as needed.  Chest x-ray was offered but declined.  Tonna Frederic, MD

## 2023-06-10 ENCOUNTER — Encounter: Payer: Self-pay | Admitting: Family Medicine

## 2023-06-10 ENCOUNTER — Ambulatory Visit: Payer: Medicare Other | Admitting: Orthopaedic Surgery

## 2023-06-10 DIAGNOSIS — J22 Unspecified acute lower respiratory infection: Secondary | ICD-10-CM

## 2023-06-13 ENCOUNTER — Ambulatory Visit: Admitting: Orthopaedic Surgery

## 2023-06-15 ENCOUNTER — Other Ambulatory Visit: Payer: Self-pay | Admitting: Cardiovascular Disease

## 2023-06-17 ENCOUNTER — Other Ambulatory Visit: Payer: Self-pay | Admitting: *Deleted

## 2023-08-12 ENCOUNTER — Encounter

## 2023-08-30 ENCOUNTER — Telehealth: Admitting: Physician Assistant

## 2023-08-30 DIAGNOSIS — R300 Dysuria: Secondary | ICD-10-CM

## 2023-08-30 NOTE — Progress Notes (Signed)
  Because of having urinary symptoms and with your age, there is an increased risk of kidney infections and acute kidney injuries. Due to this increased risk of these infections to progress rapidly, I feel your condition warrants further evaluation and I recommend that you be seen in a face-to-face visit to have a urine culture obtained.   NOTE: There will be NO CHARGE for this E-Visit   If you are having a true medical emergency, please call 911.     For an urgent face to face visit, [facility] has multiple urgent care centers for your convenience.  Click the link below for the full list of locations and hours, walk-in wait times, appointment scheduling options and driving directions:  Urgent Care - [place], [place], [place], [place], [place], [place]  [facility]   We do apologize for this inconvenience.    Your MyChart E-visit questionnaire answers were reviewed by a board certified advanced clinical practitioner to complete your personal care plan based on your specific symptoms.    Thank you for using e-Visits.      I have spent 5 minutes in review of e-visit questionnaire, review and updating patient chart, medical decision making and response to patient.   Delon CHRISTELLA Dickinson, PA-C

## 2023-09-02 ENCOUNTER — Ambulatory Visit: Admitting: Family Medicine

## 2023-09-02 ENCOUNTER — Other Ambulatory Visit: Payer: Self-pay | Admitting: Cardiovascular Disease

## 2023-09-05 ENCOUNTER — Encounter: Payer: Self-pay | Admitting: Cardiovascular Disease

## 2023-10-02 ENCOUNTER — Encounter: Payer: Self-pay | Admitting: Cardiovascular Disease

## 2023-10-02 ENCOUNTER — Ambulatory Visit: Attending: Cardiovascular Disease | Admitting: Cardiovascular Disease

## 2023-10-02 VITALS — BP 108/60 | HR 67 | Ht 65.0 in | Wt 152.8 lb

## 2023-10-02 DIAGNOSIS — I340 Nonrheumatic mitral (valve) insufficiency: Secondary | ICD-10-CM | POA: Diagnosis not present

## 2023-10-02 DIAGNOSIS — I2510 Atherosclerotic heart disease of native coronary artery without angina pectoris: Secondary | ICD-10-CM

## 2023-10-02 DIAGNOSIS — E7800 Pure hypercholesterolemia, unspecified: Secondary | ICD-10-CM | POA: Diagnosis not present

## 2023-10-02 DIAGNOSIS — I350 Nonrheumatic aortic (valve) stenosis: Secondary | ICD-10-CM

## 2023-10-02 DIAGNOSIS — I5020 Unspecified systolic (congestive) heart failure: Secondary | ICD-10-CM | POA: Diagnosis not present

## 2023-10-02 NOTE — Progress Notes (Signed)
 Chief Complaint  Patient presents with   Follow-up    CAD, ischemic cardiomyopathy    History of Present Illness: 89 yo female with history of CAD, ischemic cardiomyopathy, LBBB, mitral regurgitation and aortic stenosis who is here today for cardiac follow up. She was admitted to [facility] in November of 2015 with a NSTEMI and was found to have severe calcific disease in the LAD. LVEF was 10%. She refused CABG and underwent high risk PCI/rotablator atherectomy of the LAD with Impella LV support. 2 drug eluting stents were placed in the LAD. She has done well since then. Echo May 2019 with LVEF=30-35%, moderate AS. Most recent echo March 2023 with LVEF=30-35%. Mild mitral stenosis/regurgitation. Moderate low flow/low gradient aortic stenosis with mean gradient 10 mmHg, AVA 1.08 cm2, DI 0.38.   She is here today for follow up. The patient denies any chest pain, dyspnea, palpitations, lower extremity edema, orthopnea, PND, dizziness, near syncope or syncope.    Primary care provider:  Berneta Elsie Sayre, MD  Past Medical History:  Diagnosis Date   Arthritis    mostly my hands (12/25/2013)   Asthma    CAD (coronary artery disease) 01/02/2014   S/p NSTEMI in 11/2013 Pt declined CABG PCI:  Rotablator atherectomy and DES x 2 to LAD w Impella support     HFrEF (heart failure with reduced ejection fraction) (HCC)    Ischemic cardiomyopathy  Intol of Spironolactone  due to hyper K+ Echo 11/15: EF 10 Echo 5/19: EF 30-35 Echo 7/21: EF 30-35   Ischemic cardiomyopathy    a. EF 15%   LBBB (left bundle branch block) 12/25/2013   Mitral regurgitation 12/26/2013   Echo 5/19: Mild MR Echo 7/21: Moderate MR   Moderate Aortic stenosis 03/15/2021   Echocardiogram 06/04/2017: Moderate LVH, EF 30-35, dynamic obstruction with peak gradient 61 mmHg, anteroseptal/inferior/inferoseptal AK, AV mean gradient 12 mmHg, DI 0.33, AV peak velocity 238 cm/s, mild MR Echocardiogram 08/24/2019: EF 30-35, mild LVH, GR 1 DD,  normal RVSF, mild LAE, moderate MR, mean AV gradient 10 mmHg, DI 0.37, AV V-max 194 cm/s   PONV (postoperative nausea and vomiting)    Pure hypercholesterolemia 03/15/2021   Upper airway cough syndrome    Post-nasal drip    Past Surgical History:  Procedure Laterality Date   APPENDECTOMY  1960's   CARDIAC CATHETERIZATION  12/28/2013   Procedure: RIGHT/LEFT HEART CATH AND CORONARY ANGIOGRAPHY;  Surgeon: Debby DELENA Sor, MD;  Location: [facility] CATH LAB;  Service: Cardiovascular;;   CYSTOCELE REPAIR  12/18/2011   Procedure: ANTERIOR REPAIR (CYSTOCELE);  Surgeon: Peggye Gull, MD;  Location: [facility] ORS;  Service: Gynecology;  Laterality: N/A;   JOINT REPLACEMENT     JOINT REPLACEMENT Right 1980's   ring finger   PERCUTANEOUS CORONARY STENT INTERVENTION (PCI-S) N/A 12/30/2013   Procedure: PERCUTANEOUS CORONARY STENT INTERVENTION (PCI-S);  Surgeon: Lonni JONETTA Cash, MD;  Location: [facility] CATH LAB;  Service: Cardiovascular;  Laterality: N/A;   REPLACEMENT TOTAL KNEE BILATERAL Bilateral 2009   TOTAL HIP ARTHROPLASTY Left 10/17/2018   Procedure: LEFT TOTAL HIP ARTHROPLASTY ANTERIOR APPROACH;  Surgeon: Vernetta Lonni GRADE, MD;  Location: [facility] ORS;  Service: Orthopedics;  Laterality: Left;    Current Outpatient Medications  Medication Sig Dispense Refill   acetaminophen  (TYLENOL ) 650 MG CR tablet Take 1,300 mg by mouth 2 (two) times daily.     albuterol  (PROVENTIL ) (2.5 MG/3ML) 0.083% nebulizer solution Take 3 mLs (2.5 mg total) by nebulization every 6 (six) hours as needed for wheezing or  shortness of breath. 150 mL 1   albuterol  (VENTOLIN  HFA) 108 (90 Base) MCG/ACT inhaler INHALE 1-2 PUFFS BY MOUTH EVERY 6 HOURS AS NEEDED FOR WHEEZE OR SHORTNESS OF BREATH 25.5 each 1   albuterol  (VENTOLIN  HFA) 108 (90 Base) MCG/ACT inhaler Inhale 2 puffs into the lungs every 6 (six) hours as needed for wheezing or shortness of breath. 8 g 0   atorvastatin  (LIPITOR) 40 MG tablet TAKE ONE-HALF TABLET BY MOUTH  DAILY 50  tablet 0   benzonatate  (TESSALON ) 100 MG capsule Take 1 capsule (100 mg total) by mouth 3 (three) times daily as needed. 30 capsule 0   benzonatate  (TESSALON ) 200 MG capsule Take 1 capsule (200 mg total) by mouth 2 (two) times daily as needed for cough. 20 capsule 0   carvedilol  (COREG ) 6.25 MG tablet TAKE 1 TABLET BY MOUTH TWICE  DAILY WITH MEALS 200 tablet 0   clopidogrel  (PLAVIX ) 75 MG tablet TAKE 1 TABLET BY MOUTH DAILY  WITH BREAKFAST 100 tablet 2   furosemide  (LASIX ) 40 MG tablet TAKE 1 TABLET BY MOUTH DAILY 100 tablet 0   losartan  (COZAAR ) 25 MG tablet TAKE 1 TABLET BY MOUTH DAILY 100 tablet 0   mupirocin ointment (BACTROBAN) 2 % Apply topically daily.     spironolactone  (ALDACTONE ) 25 MG tablet TAKE ONE-HALF TABLET BY MOUTH  TWICE DAILY AT 8 AM AND 6 PM 100 tablet 0   trimethoprim -polymyxin b  (POLYTRIM ) ophthalmic solution Place 1 drop into both eyes every 4 (four) hours. 10 mL 0   No current facility-administered medications for this visit.    Allergies  Allergen Reactions   Codeine      Nauseated   Lisinopril  Cough   Tramadol      Confusion    Social History   Socioeconomic History   Marital status: Widowed    Spouse name: Not on file   Number of children: Not on file   Years of education: Not on file   Highest education level: 12th grade  Occupational History   Not on file  Tobacco Use   Smoking status: Never   Smokeless tobacco: Never  Vaping Use   Vaping status: Never Used  Substance and Sexual Activity   Alcohol use: No   Drug use: No   Sexual activity: Not on file  Other Topics Concern   Not on file  Social History Narrative   Not on file   Social Drivers of Health   Financial Resource Strain: Low Risk  (08/05/2023)   Overall Financial Resource Strain (CARDIA)    Difficulty of Paying Living Expenses: Not very hard  Food Insecurity: No Food Insecurity (08/05/2023)   Hunger Vital Sign    Worried About Running Out of Food in the Last Year: Never true     Ran Out of Food in the Last Year: Never true  Transportation Needs: No Transportation Needs (08/05/2023)   PRAPARE - Administrator, Civil Service (Medical): No    Lack of Transportation (Non-Medical): No  Physical Activity: Inactive (08/05/2023)   Exercise Vital Sign    Days of Exercise per Week: 0 days    Minutes of Exercise per Session: Not on file  Stress: No Stress Concern Present (08/05/2023)   [employer] of Occupational Health - Occupational Stress Questionnaire    Feeling of Stress: Not at all  Social Connections: Moderately Integrated (08/05/2023)   Social Connection and Isolation Panel    Frequency of Communication with Friends and Family: More than three times a  week    Frequency of Social Gatherings with Friends and Family: Three times a week    Attends Religious Services: More than 4 times per year    Active Member of Clubs or Organizations: Yes    Attends Banker Meetings: More than 4 times per year    Marital Status: Widowed  Intimate Partner Violence: Not At Risk (08/10/2022)   Humiliation, Afraid, Rape, and Kick questionnaire    Fear of Current or Ex-Partner: No    Emotionally Abused: No    Physically Abused: No    Sexually Abused: No    Family History  Problem Relation Age of Onset   Cerebral aneurysm Mother        died at age 67   Healthy Father        died of old age    Review of Systems:  As stated in the HPI and otherwise negative.   BP 108/60   Pulse 67   Ht 5' 5 (1.651 m)   Wt 152 lb 12.8 oz (69.3 kg)   SpO2 97%   BMI 25.43 kg/m   Physical Examination:  General: Well developed, well nourished, NAD  HEENT: OP clear, mucus membranes moist  SKIN: warm, dry. No rashes. Neuro: No focal deficits  Musculoskeletal: Muscle strength 5/5 all ext  Psychiatric: Mood and affect normal  Neck: No JVD, no carotid bruits, no thyromegaly, no lymphadenopathy.  Lungs:Clear bilaterally, no wheezes, rhonci, crackles Cardiovascular:  Regular rate and rhythm. No murmurs, gallops or rubs. Abdomen:Soft. Bowel sounds present. Non-tender.  Extremities: No lower extremity edema. Pulses are 2 + in the bilateral DP/PT.  Echo March 2023: 1. Left ventricular ejection fraction, by estimation, is 30 to 35%. The  left ventricle has moderately decreased function. The left ventricle  demonstrates regional wall motion abnormalities with severe hypokinesis to  akinesis of the mid to apical  anteroseptal, inferoseptal, and anterior walls and the true apex.  Septal-lateral dyssynchrony is present. There is mild left ventricular  hypertrophy. Left ventricular diastolic parameters are consistent with  Grade I diastolic dysfunction (impaired  relaxation).   2. Right ventricular systolic function is normal. The right ventricular  size is normal. Tricuspid regurgitation signal is inadequate for assessing  PA pressure.   3. The mitral valve is degenerative. Mild mitral valve regurgitation.  Mild mitral stenosis. The mean mitral valve gradient is 5.0 mmHg. Moderate  mitral annular calcification.   4. The aortic valve is tricuspid. There is moderate calcification of the  aortic valve. Aortic valve regurgitation is not visualized. Low flow/low  gradient moderate aortic valve stenosis. Aortic valve area, by VTI  measures 1.08 cm. Aortic valve mean  gradient measures 10.0 mmHg.   5. Left atrial size was mildly dilated.   6. The inferior vena cava is normal in size with greater than 50%  respiratory variability, suggesting right atrial pressure of 3 mmHg.   EKG:  EKG is ordered today. The ekg ordered today demonstrates  EKG Interpretation Date/Time:  Wednesday October 02 2023 15:23:05 EDT Ventricular Rate:  76 PR Interval:  216 QRS Duration:  158 QT Interval:  434 QTC Calculation: 488 R Axis:   -51  Text Interpretation: Sinus rhythm with 1st degree A-V block Left bundle branch block Confirmed by Verlin Bruckner [phone]) on  10/02/2023 3:23:56 PM   Recent Labs: No results found for requested labs within last 365 days.   Lipid Panel    Component Value Date/Time   CHOL 124 05/02/2021 0918  CHOL 131 07/23/2019 0839   TRIG 58.0 05/02/2021 0918   HDL 62.30 05/02/2021 0918   HDL 46 07/23/2019 0839   CHOLHDL 2 05/02/2021 0918   VLDL 11.6 05/02/2021 0918   LDLCALC 50 05/02/2021 0918   LDLCALC 65 07/23/2019 0839     Wt Readings from Last 3 Encounters:  10/02/23 152 lb 12.8 oz (69.3 kg)  06/07/23 163 lb (73.9 kg)  02/26/23 158 lb 3.2 oz (71.8 kg)     Assessment and Plan:   1. CAD without angina: No chest pain. Continue Coreg , Plavix  and Lipitor.         2. Ischemic cardiomyopathy: LVEF 30-35% by Echo March 2023. Continue Coreg , Losartan  and aldactone .   3. Chronic systolic CHF: Wt is stable. No volume overload on exam. Continue Lasix .   4. LBBB: Chronic  5. Mitral regurgitation: Mild by echo in March 2023. No loud murmur on exam.   6. Low flow/low gradient moderate aortic stenosis: She had moderate AS by echo in May 2019 which is unchanged on echo in 2023. She remains asymptomatic. She does not wish to consider any invasive procedures. Will not repeat her echo given advanced age.   7. Hyperlipidemia: Lipids followed in primary care. Continue Lipitor  Her memory has declined significantly in the last year. NO plans for further cardiac testing. Discussed with her daughter. I will fill her medications even if she cannot make her f/u appt in one year  Labs/ tests ordered today include:  Orders Placed This Encounter  Procedures   EKG 12-Lead   Disposition:   F/U with me in 12 months.   Signed, Lonni Cash, MD 10/02/2023 4:07 PM    [facility] [address] [place], [place], [place]  [address] Phone: [phone]; Fax: [phone]

## 2023-10-16 ENCOUNTER — Other Ambulatory Visit: Payer: Self-pay | Admitting: Cardiovascular Disease

## 2023-12-02 ENCOUNTER — Encounter: Payer: Self-pay | Admitting: Radiology

## 2023-12-30 ENCOUNTER — Encounter

## 2024-01-27 ENCOUNTER — Ambulatory Visit

## 2024-02-13 ENCOUNTER — Encounter: Payer: Self-pay | Admitting: Family Medicine

## 2024-02-13 NOTE — Telephone Encounter (Addendum)
 Called patient's daughter Beth Johnston who is on DPR on file. I informed her that I spoke with Dr. Berneta and he would like for her mom to be seen in the office since it has been May 2025 she was last seen in office. Mrs. Johnston stated that she would really like to have a phone call or a video visit. She began to cry stating that she is afraid that bringing her mom into the office that they will catch COVID and she does not want that. I informed her that she can ask for a mask when they arrive for check in. I also stated that Dr. Berneta does not just prescribe medication without doing an evaluation and that it has been a while since her mother was last seen she would need an appointment. She asked if I could ask Dr. Berneta to just give her a call. I informed her that he is currently seeing patient and that I can forward her message to him and that he will tell me to have patient see me. She then again asked if I could just schedule for a telephone call or video visit. I asked if I could place her on a brief hold to speak with Dr. Berneta if he is available to talk with me. Informed Dr. Berneta of Beth Johnston's wishes and how she is asking for him to call her to to please see them as a video. I also informed him that he last saw mother Beth Johnston in May of 2025. He stated that he is agreeable with what I already informed her of having her mother come in the office for an appointment. I explained to him patient's daughter is on the phone crying and expressed to ask if he could please do a video visit with her mother because she does not want to catch COVID and she is the only care taker for her mother. Dr. Berneta agreed to do a video visit with patient and her daughter. I informed patient's daughter Beth of getting her scheduled for a virtual visit and to make sure that she can get her mother's vital signs for the day of appointment. She verbalized understanding and all (if any) questions were answered. She asked expressed  extreme gratitude for Dr. Berneta doing a virtual visit.

## 2024-02-14 NOTE — Telephone Encounter (Signed)
 Patient is scheduled for 02/28/24 for virtual visit okay per Dr. Berneta

## 2024-02-17 ENCOUNTER — Ambulatory Visit (INDEPENDENT_AMBULATORY_CARE_PROVIDER_SITE_OTHER): Admitting: Family Medicine

## 2024-02-17 ENCOUNTER — Encounter: Payer: Self-pay | Admitting: Family Medicine

## 2024-02-17 VITALS — BP 110/54 | HR 74 | Temp 97.7°F | Ht 65.0 in | Wt 158.2 lb

## 2024-02-17 DIAGNOSIS — G4709 Other insomnia: Secondary | ICD-10-CM

## 2024-02-17 DIAGNOSIS — B349 Viral infection, unspecified: Secondary | ICD-10-CM | POA: Diagnosis not present

## 2024-02-17 DIAGNOSIS — R4189 Other symptoms and signs involving cognitive functions and awareness: Secondary | ICD-10-CM

## 2024-02-17 DIAGNOSIS — R627 Adult failure to thrive: Secondary | ICD-10-CM | POA: Diagnosis not present

## 2024-02-17 MED ORDER — MIRTAZAPINE 7.5 MG PO TABS: 7.5000 mg | ORAL_TABLET | Freq: Every day | ORAL | 2 refills | Status: AC

## 2024-02-17 MED ORDER — DM-GUAIFENESIN ER 30-600 MG PO TB12: 1.0000 | ORAL_TABLET | Freq: Two times a day (BID) | ORAL | 1 refills | Status: AC | PRN

## 2024-02-17 NOTE — Progress Notes (Signed)
 "  Established Patient Office Visit   Subjective:  Patient ID: Beth Johnston, female    DOB: January 13, 1926  Age: 89 y.o. MRN: [id number]  Chief Complaint  Patient presents with   Cough    Cough started thursady and then it got worse Friday. Pt does not feel bad just a cough    Cough Pertinent negatives include no chills, eye redness, fever, myalgias, rash or shortness of breath.   Encounter Diagnoses  Name Primary?   Other insomnia Yes   Viral syndrome    3-day history of a dry cough without fever or chills or difficulty breathing.  Also has been having difficulty sleeping and calming down in the evening hours.  She has not been napping.   Review of Systems  Constitutional: Negative.  Negative for chills and fever.  HENT: Negative.    Eyes:  Negative for blurred vision, discharge and redness.  Respiratory:  Positive for cough. Negative for sputum production and shortness of breath.   Cardiovascular: Negative.   Gastrointestinal:  Negative for abdominal pain.  Genitourinary: Negative.   Musculoskeletal: Negative.  Negative for myalgias.  Skin:  Negative for rash.  Neurological:  Negative for tingling, loss of consciousness and weakness.  Endo/Heme/Allergies:  Negative for polydipsia.   Okay she is ready to go Current Medications[1]   Objective:     BP (!) 110/54   Pulse 74   Temp 97.7 F (36.5 C)   Ht 5' 5 (1.651 m)   Wt 158 lb 3.2 oz (71.8 kg)   SpO2 98%   BMI 26.33 kg/m    Physical Exam Constitutional:      General: She is not in acute distress.    Appearance: Normal appearance. She is not ill-appearing, toxic-appearing or diaphoretic.  HENT:     Head: Normocephalic and atraumatic.     Right Ear: External ear normal.     Left Ear: External ear normal.     Mouth/Throat:     Mouth: Mucous membranes are moist.     Pharynx: Oropharynx is clear. No oropharyngeal exudate or posterior oropharyngeal erythema.  Eyes:     General: No scleral icterus.        Right eye: No discharge.        Left eye: No discharge.     Extraocular Movements: Extraocular movements intact.     Conjunctiva/sclera: Conjunctivae normal.     Pupils: Pupils are equal, round, and reactive to light.  Cardiovascular:     Rate and Rhythm: Normal rate and regular rhythm.  Pulmonary:     Effort: Pulmonary effort is normal. No respiratory distress.     Breath sounds: Normal breath sounds. No wheezing, rhonchi or rales.  Abdominal:     General: Bowel sounds are normal.     Tenderness: There is no abdominal tenderness. There is no guarding.  Musculoskeletal:     Cervical back: No rigidity or tenderness.  Skin:    General: Skin is warm and dry.  Neurological:     Mental Status: She is alert and oriented to person, place, and time.  Psychiatric:        Mood and Affect: Mood normal.        Behavior: Behavior normal.      No results found for any visits on 02/17/24.    The ASCVD Risk score (Arnett DK, et al., 2019) failed to calculate for the following reasons:   The 2019 ASCVD risk score is only valid for ages 20 to 31  Risk score cannot be calculated because patient has a medical history suggesting prior/existing ASCVD   * - Cholesterol units were assumed    Assessment & Plan:   Other insomnia -     Mirtazapine ; Take 1 tablet (7.5 mg total) by mouth at bedtime.  Dispense: 30 tablet; Refill: 2  Viral syndrome -     DM-guaiFENesin  ER; Take 1 tablet by mouth 2 (two) times daily as needed for cough.  Dispense: 20 tablet; Refill: 1    Return Call if not improving by the end of the week..  Has difficulty with amoxicillin .  Elsie Sim Lent, MD    [1]  Current Outpatient Medications:    acetaminophen  (TYLENOL ) 650 MG CR tablet, Take 1,300 mg by mouth 2 (two) times daily., Disp: , Rfl:    albuterol  (PROVENTIL ) (2.5 MG/3ML) 0.083% nebulizer solution, Take 3 mLs (2.5 mg total) by nebulization every 6 (six) hours as needed for wheezing or shortness of  breath., Disp: 150 mL, Rfl: 1   albuterol  (VENTOLIN  HFA) 108 (90 Base) MCG/ACT inhaler, INHALE 1-2 PUFFS BY MOUTH EVERY 6 HOURS AS NEEDED FOR WHEEZE OR SHORTNESS OF BREATH, Disp: 25.5 each, Rfl: 1   albuterol  (VENTOLIN  HFA) 108 (90 Base) MCG/ACT inhaler, Inhale 2 puffs into the lungs every 6 (six) hours as needed for wheezing or shortness of breath., Disp: 8 g, Rfl: 0   atorvastatin  (LIPITOR) 40 MG tablet, TAKE ONE-HALF TABLET BY MOUTH  DAILY, Disp: 45 tablet, Rfl: 3   benzonatate  (TESSALON ) 100 MG capsule, Take 1 capsule (100 mg total) by mouth 3 (three) times daily as needed., Disp: 30 capsule, Rfl: 0   benzonatate  (TESSALON ) 200 MG capsule, Take 1 capsule (200 mg total) by mouth 2 (two) times daily as needed for cough., Disp: 20 capsule, Rfl: 0   carvedilol  (COREG ) 6.25 MG tablet, TAKE 1 TABLET BY MOUTH TWICE  DAILY WITH MEALS, Disp: 180 tablet, Rfl: 3   clopidogrel  (PLAVIX ) 75 MG tablet, TAKE 1 TABLET BY MOUTH DAILY  WITH BREAKFAST, Disp: 90 tablet, Rfl: 3   dextromethorphan-guaiFENesin  (MUCINEX  DM) 30-600 MG 12hr tablet, Take 1 tablet by mouth 2 (two) times daily as needed for cough., Disp: 20 tablet, Rfl: 1   furosemide  (LASIX ) 40 MG tablet, TAKE 1 TABLET BY MOUTH DAILY, Disp: 90 tablet, Rfl: 3   losartan  (COZAAR ) 25 MG tablet, TAKE 1 TABLET BY MOUTH DAILY, Disp: 90 tablet, Rfl: 3   mirtazapine  (REMERON ) 7.5 MG tablet, Take 1 tablet (7.5 mg total) by mouth at bedtime., Disp: 30 tablet, Rfl: 2   mupirocin ointment (BACTROBAN) 2 %, Apply topically daily., Disp: , Rfl:    spironolactone  (ALDACTONE ) 25 MG tablet, TAKE ONE-HALF TABLET BY MOUTH TWICE DAILY AT 8 AM AND 6 PM, Disp: 90 tablet, Rfl: 3   trimethoprim -polymyxin b  (POLYTRIM ) ophthalmic solution, Place 1 drop into both eyes every 4 (four) hours., Disp: 10 mL, Rfl: 0  "

## 2024-02-18 NOTE — Addendum Note (Signed)
 Addended by: BERNETA ELSIE LABOR on: 02/18/2024 11:32 AM   Modules accepted: Orders

## 2024-02-24 ENCOUNTER — Telehealth: Payer: Self-pay

## 2024-02-24 NOTE — Progress Notes (Signed)
 Complex Care Management Note  Care Guide Note 02/24/2024 Name: Beth Johnston MRN: [id number] DOB: May 10, 1925  Beth Johnston is a 89 y.o. year old female who sees Berneta Elsie Sayre, MD for primary care. I reached out to [employer] by phone today to offer complex care management services.  Ms. Brinker was given information about Complex Care Management services today including:   The Complex Care Management services include support from the care team which includes your Nurse Care Manager, Clinical Social Worker, or Pharmacist.  The Complex Care Management team is here to help remove barriers to the health concerns and goals most important to you. Complex Care Management services are voluntary, and the patient may decline or stop services at any time by request to their care team member.   Complex Care Management Consent Status: Patient did not agree to participate in complex care management services at this time.  Follow up plan:  Daughter states going with a different option.  Encounter Outcome:  Patient Refused  [employer], [facility] VBCI Assistant Direct Dial: [phone]  Fax: [phone]

## 2024-02-25 ENCOUNTER — Encounter: Payer: Self-pay | Admitting: Family Medicine

## 2024-02-25 DIAGNOSIS — J22 Unspecified acute lower respiratory infection: Secondary | ICD-10-CM

## 2024-02-26 MED ORDER — AZITHROMYCIN 250 MG PO TABS: ORAL_TABLET | ORAL | 0 refills | Status: AC

## 2024-02-28 ENCOUNTER — Ambulatory Visit: Admitting: Family Medicine
